# Patient Record
Sex: Female | Born: 1935 | Race: White | Hispanic: No | Smoking: Never smoker
Health system: Southern US, Community
[De-identification: ages and names within clinical notes are randomized; demographics above are authoritative.]

## PROBLEM LIST (undated history)

## (undated) DIAGNOSIS — G250 Essential tremor: Secondary | ICD-10-CM

## (undated) DIAGNOSIS — M810 Age-related osteoporosis without current pathological fracture: Secondary | ICD-10-CM

## (undated) DIAGNOSIS — E042 Nontoxic multinodular goiter: Secondary | ICD-10-CM

## (undated) DIAGNOSIS — I350 Nonrheumatic aortic (valve) stenosis: Secondary | ICD-10-CM

## (undated) DIAGNOSIS — N184 Chronic kidney disease, stage 4 (severe): Secondary | ICD-10-CM

## (undated) DIAGNOSIS — I5032 Chronic diastolic (congestive) heart failure: Secondary | ICD-10-CM

## (undated) DIAGNOSIS — I2510 Atherosclerotic heart disease of native coronary artery without angina pectoris: Secondary | ICD-10-CM

## (undated) DIAGNOSIS — I10 Essential (primary) hypertension: Secondary | ICD-10-CM

## (undated) DIAGNOSIS — N6019 Diffuse cystic mastopathy of unspecified breast: Secondary | ICD-10-CM

## (undated) DIAGNOSIS — K219 Gastro-esophageal reflux disease without esophagitis: Secondary | ICD-10-CM

## (undated) DIAGNOSIS — J9811 Atelectasis: Secondary | ICD-10-CM

## (undated) DIAGNOSIS — H3530 Unspecified macular degeneration: Secondary | ICD-10-CM

## (undated) DIAGNOSIS — Z8711 Personal history of peptic ulcer disease: Secondary | ICD-10-CM

## (undated) DIAGNOSIS — Z8719 Personal history of other diseases of the digestive system: Secondary | ICD-10-CM

## (undated) DIAGNOSIS — C4400 Unspecified malignant neoplasm of skin of lip: Secondary | ICD-10-CM

## (undated) DIAGNOSIS — J309 Allergic rhinitis, unspecified: Secondary | ICD-10-CM

## (undated) DIAGNOSIS — R809 Proteinuria, unspecified: Secondary | ICD-10-CM

## (undated) DIAGNOSIS — E7800 Pure hypercholesterolemia, unspecified: Secondary | ICD-10-CM

## (undated) HISTORY — DX: Gastro-esophageal reflux disease without esophagitis: K21.9

## (undated) HISTORY — DX: Essential (primary) hypertension: I10

## (undated) HISTORY — DX: Proteinuria, unspecified: R80.9

## (undated) HISTORY — DX: Allergic rhinitis, unspecified: J30.9

## (undated) HISTORY — PX: DILATION AND CURETTAGE OF UTERUS: SHX78

## (undated) HISTORY — PX: TONSILLECTOMY: SUR1361

## (undated) HISTORY — PX: CARDIAC VALVE REPLACEMENT: SHX585

## (undated) HISTORY — DX: Diffuse cystic mastopathy of unspecified breast: N60.19

## (undated) HISTORY — DX: Nonrheumatic aortic (valve) stenosis: I35.0

## (undated) HISTORY — DX: Essential tremor: G25.0

## (undated) HISTORY — PX: FRACTURE SURGERY: SHX138

## (undated) HISTORY — PX: CATARACT EXTRACTION, BILATERAL: SHX1313

## (undated) HISTORY — DX: Atherosclerotic heart disease of native coronary artery without angina pectoris: I25.10

## (undated) HISTORY — PX: MOHS SURGERY: SUR867

## (undated) HISTORY — DX: Nontoxic multinodular goiter: E04.2

## (undated) HISTORY — DX: Atelectasis: J98.11

## (undated) HISTORY — PX: TYMPANOPLASTY: SHX33

## (undated) HISTORY — DX: Age-related osteoporosis without current pathological fracture: M81.0

## (undated) HISTORY — PX: ROTATOR CUFF REPAIR: SHX139

## (undated) HISTORY — PX: NASAL SINUS SURGERY: SHX719

---

## 1997-12-31 ENCOUNTER — Other Ambulatory Visit: Admission: RE | Admit: 1997-12-31 | Discharge: 1997-12-31 | Payer: Self-pay | Admitting: Geriatric Medicine

## 1998-01-03 ENCOUNTER — Other Ambulatory Visit: Admission: RE | Admit: 1998-01-03 | Discharge: 1998-01-03 | Payer: Self-pay | Admitting: Geriatric Medicine

## 1998-09-04 ENCOUNTER — Other Ambulatory Visit: Admission: RE | Admit: 1998-09-04 | Discharge: 1998-09-04 | Payer: Self-pay | Admitting: Geriatric Medicine

## 2000-01-23 ENCOUNTER — Other Ambulatory Visit: Admission: RE | Admit: 2000-01-23 | Discharge: 2000-01-23 | Payer: Self-pay | Admitting: Geriatric Medicine

## 2000-02-24 ENCOUNTER — Encounter: Payer: Self-pay | Admitting: Geriatric Medicine

## 2000-02-24 ENCOUNTER — Encounter: Admission: RE | Admit: 2000-02-24 | Discharge: 2000-02-24 | Payer: Self-pay | Admitting: Geriatric Medicine

## 2000-03-01 ENCOUNTER — Encounter: Payer: Self-pay | Admitting: Geriatric Medicine

## 2000-03-01 ENCOUNTER — Encounter: Admission: RE | Admit: 2000-03-01 | Discharge: 2000-03-01 | Payer: Self-pay | Admitting: *Deleted

## 2001-05-05 ENCOUNTER — Encounter: Admission: RE | Admit: 2001-05-05 | Discharge: 2001-05-05 | Payer: Self-pay | Admitting: Geriatric Medicine

## 2001-05-05 ENCOUNTER — Encounter: Payer: Self-pay | Admitting: Geriatric Medicine

## 2001-11-24 ENCOUNTER — Emergency Department (HOSPITAL_COMMUNITY): Admission: EM | Admit: 2001-11-24 | Discharge: 2001-11-24 | Payer: Self-pay | Admitting: Emergency Medicine

## 2001-11-26 HISTORY — PX: WRIST FRACTURE SURGERY: SHX121

## 2001-11-29 ENCOUNTER — Encounter: Admission: RE | Admit: 2001-11-29 | Discharge: 2001-11-29 | Payer: Self-pay | Admitting: Neurology

## 2001-11-30 ENCOUNTER — Ambulatory Visit (HOSPITAL_BASED_OUTPATIENT_CLINIC_OR_DEPARTMENT_OTHER): Admission: RE | Admit: 2001-11-30 | Discharge: 2001-11-30 | Payer: Self-pay | Admitting: Orthopedic Surgery

## 2005-09-02 ENCOUNTER — Emergency Department (HOSPITAL_COMMUNITY): Admission: EM | Admit: 2005-09-02 | Discharge: 2005-09-02 | Payer: Self-pay | Admitting: Emergency Medicine

## 2005-10-14 ENCOUNTER — Encounter: Admission: RE | Admit: 2005-10-14 | Discharge: 2005-10-14 | Payer: Self-pay | Admitting: Geriatric Medicine

## 2006-05-10 ENCOUNTER — Encounter: Admission: RE | Admit: 2006-05-10 | Discharge: 2006-05-10 | Payer: Self-pay | Admitting: Geriatric Medicine

## 2007-01-24 ENCOUNTER — Encounter: Admission: RE | Admit: 2007-01-24 | Discharge: 2007-01-24 | Payer: Self-pay | Admitting: Orthopedic Surgery

## 2007-01-26 ENCOUNTER — Ambulatory Visit (HOSPITAL_BASED_OUTPATIENT_CLINIC_OR_DEPARTMENT_OTHER): Admission: RE | Admit: 2007-01-26 | Discharge: 2007-01-27 | Payer: Self-pay | Admitting: Orthopedic Surgery

## 2007-11-16 ENCOUNTER — Encounter: Admission: RE | Admit: 2007-11-16 | Discharge: 2007-11-16 | Payer: Self-pay | Admitting: Geriatric Medicine

## 2008-11-14 ENCOUNTER — Encounter: Admission: RE | Admit: 2008-11-14 | Discharge: 2008-11-14 | Payer: Self-pay | Admitting: Geriatric Medicine

## 2009-08-28 DIAGNOSIS — I350 Nonrheumatic aortic (valve) stenosis: Secondary | ICD-10-CM

## 2009-08-28 HISTORY — DX: Nonrheumatic aortic (valve) stenosis: I35.0

## 2009-09-22 ENCOUNTER — Emergency Department (HOSPITAL_COMMUNITY): Admission: EM | Admit: 2009-09-22 | Discharge: 2009-09-23 | Payer: Self-pay | Admitting: Emergency Medicine

## 2009-09-28 HISTORY — PX: AORTIC VALVE REPLACEMENT: SHX41

## 2009-09-28 HISTORY — PX: CORONARY ARTERY BYPASS GRAFT: SHX141

## 2009-10-17 ENCOUNTER — Inpatient Hospital Stay (HOSPITAL_BASED_OUTPATIENT_CLINIC_OR_DEPARTMENT_OTHER): Admission: RE | Admit: 2009-10-17 | Discharge: 2009-10-17 | Payer: Self-pay | Admitting: Cardiology

## 2009-10-24 ENCOUNTER — Ambulatory Visit: Payer: Self-pay | Admitting: Cardiothoracic Surgery

## 2009-10-29 DIAGNOSIS — I2510 Atherosclerotic heart disease of native coronary artery without angina pectoris: Secondary | ICD-10-CM

## 2009-10-29 HISTORY — DX: Atherosclerotic heart disease of native coronary artery without angina pectoris: I25.10

## 2009-11-13 ENCOUNTER — Encounter: Payer: Self-pay | Admitting: Cardiothoracic Surgery

## 2009-11-15 ENCOUNTER — Inpatient Hospital Stay (HOSPITAL_COMMUNITY): Admission: RE | Admit: 2009-11-15 | Discharge: 2009-11-23 | Payer: Self-pay | Admitting: Cardiothoracic Surgery

## 2009-11-15 ENCOUNTER — Encounter: Payer: Self-pay | Admitting: Cardiothoracic Surgery

## 2009-11-15 ENCOUNTER — Ambulatory Visit: Payer: Self-pay | Admitting: Cardiothoracic Surgery

## 2009-11-17 HISTORY — PX: CARDIAC CATHETERIZATION: SHX172

## 2009-12-03 ENCOUNTER — Ambulatory Visit: Payer: Self-pay | Admitting: Internal Medicine

## 2009-12-03 ENCOUNTER — Ambulatory Visit: Payer: Self-pay | Admitting: Cardiology

## 2009-12-03 ENCOUNTER — Inpatient Hospital Stay (HOSPITAL_COMMUNITY): Admission: EM | Admit: 2009-12-03 | Discharge: 2009-12-12 | Payer: Self-pay | Admitting: Emergency Medicine

## 2009-12-04 ENCOUNTER — Encounter (INDEPENDENT_AMBULATORY_CARE_PROVIDER_SITE_OTHER): Payer: Self-pay | Admitting: Cardiology

## 2009-12-09 ENCOUNTER — Encounter: Payer: Self-pay | Admitting: Cardiothoracic Surgery

## 2009-12-26 ENCOUNTER — Ambulatory Visit: Payer: Self-pay | Admitting: Cardiothoracic Surgery

## 2009-12-26 ENCOUNTER — Encounter: Admission: RE | Admit: 2009-12-26 | Discharge: 2009-12-26 | Payer: Self-pay | Admitting: Cardiothoracic Surgery

## 2010-01-01 ENCOUNTER — Ambulatory Visit: Payer: Self-pay | Admitting: Cardiothoracic Surgery

## 2010-01-23 ENCOUNTER — Encounter (HOSPITAL_COMMUNITY): Admission: RE | Admit: 2010-01-23 | Discharge: 2010-04-23 | Payer: Self-pay | Admitting: Cardiology

## 2010-02-13 ENCOUNTER — Ambulatory Visit: Payer: Self-pay | Admitting: Cardiothoracic Surgery

## 2010-07-20 IMAGING — CR DG CHEST 2V
2 series · 2 of 2 positions shown · non-contrast
Comparison: 12/07/2009

CLINICAL DATA: Shortness of breath.  Pleural effusions.

CHEST - 2 VIEW

[w chest pa]
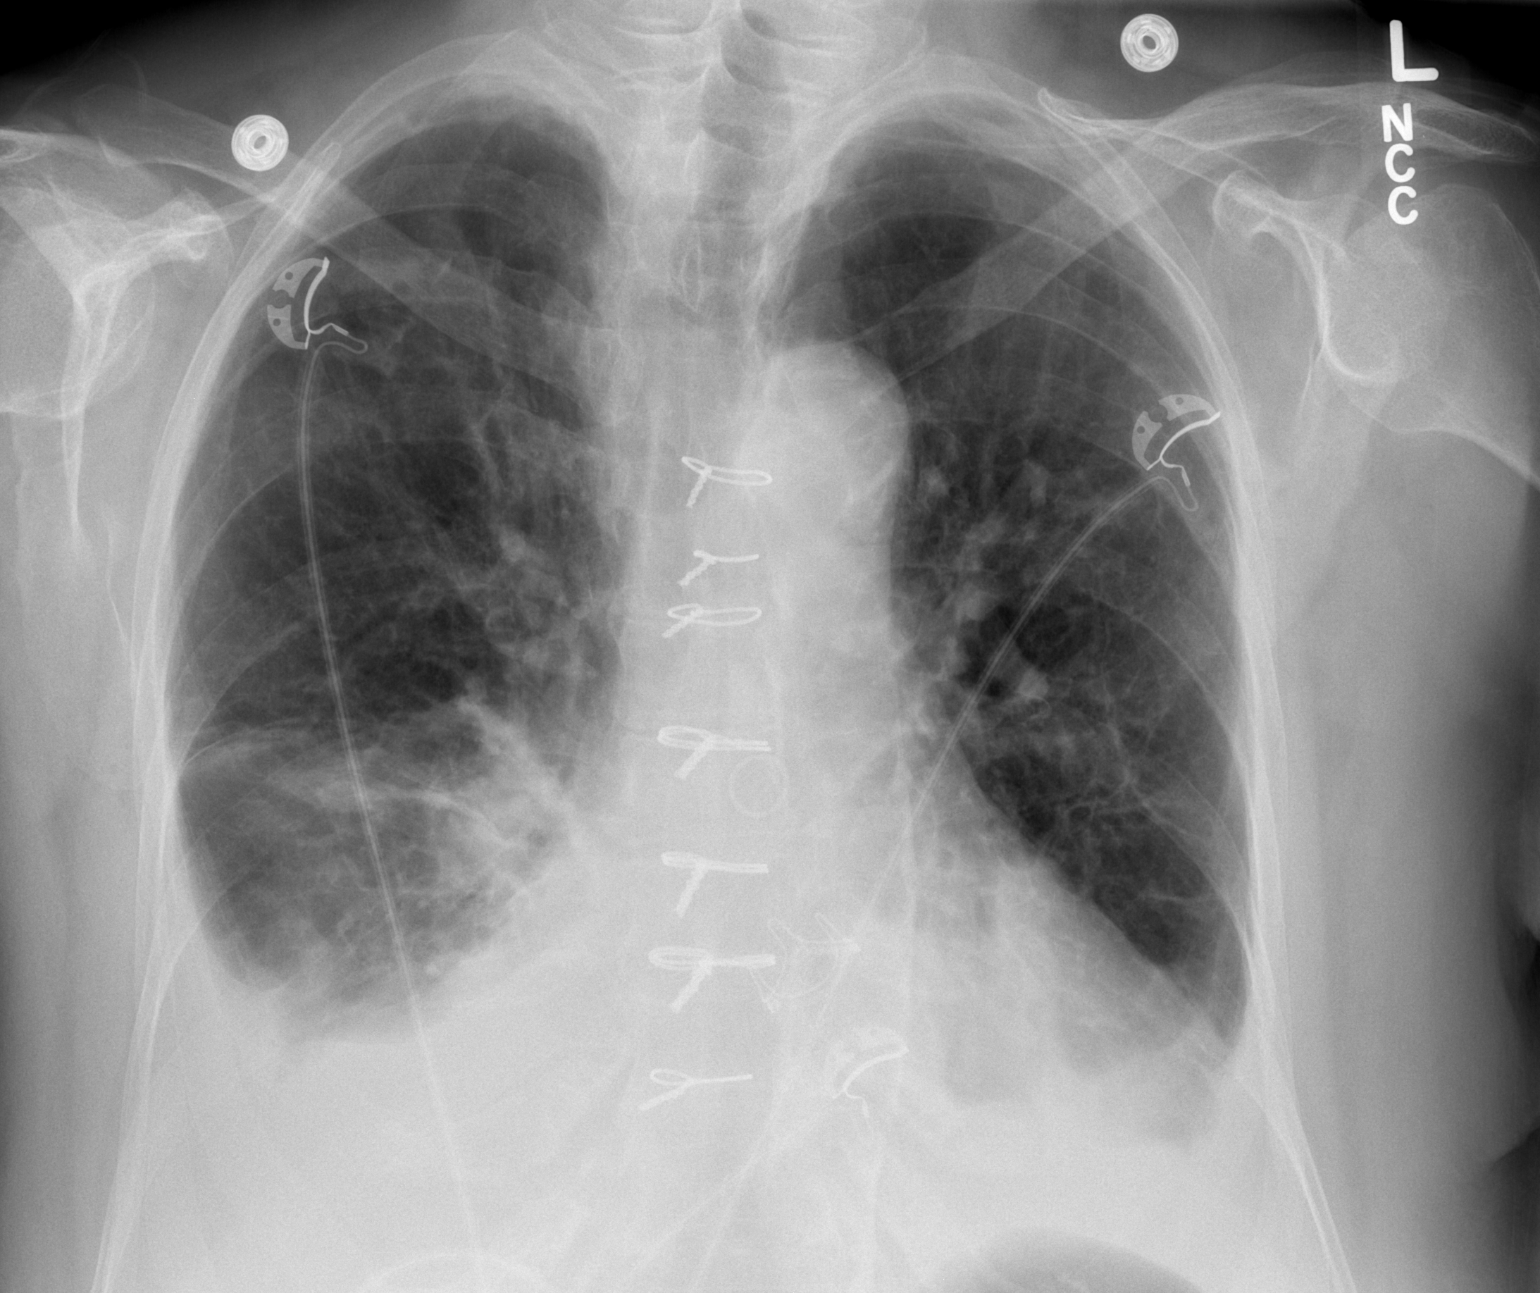

[w chest lat]
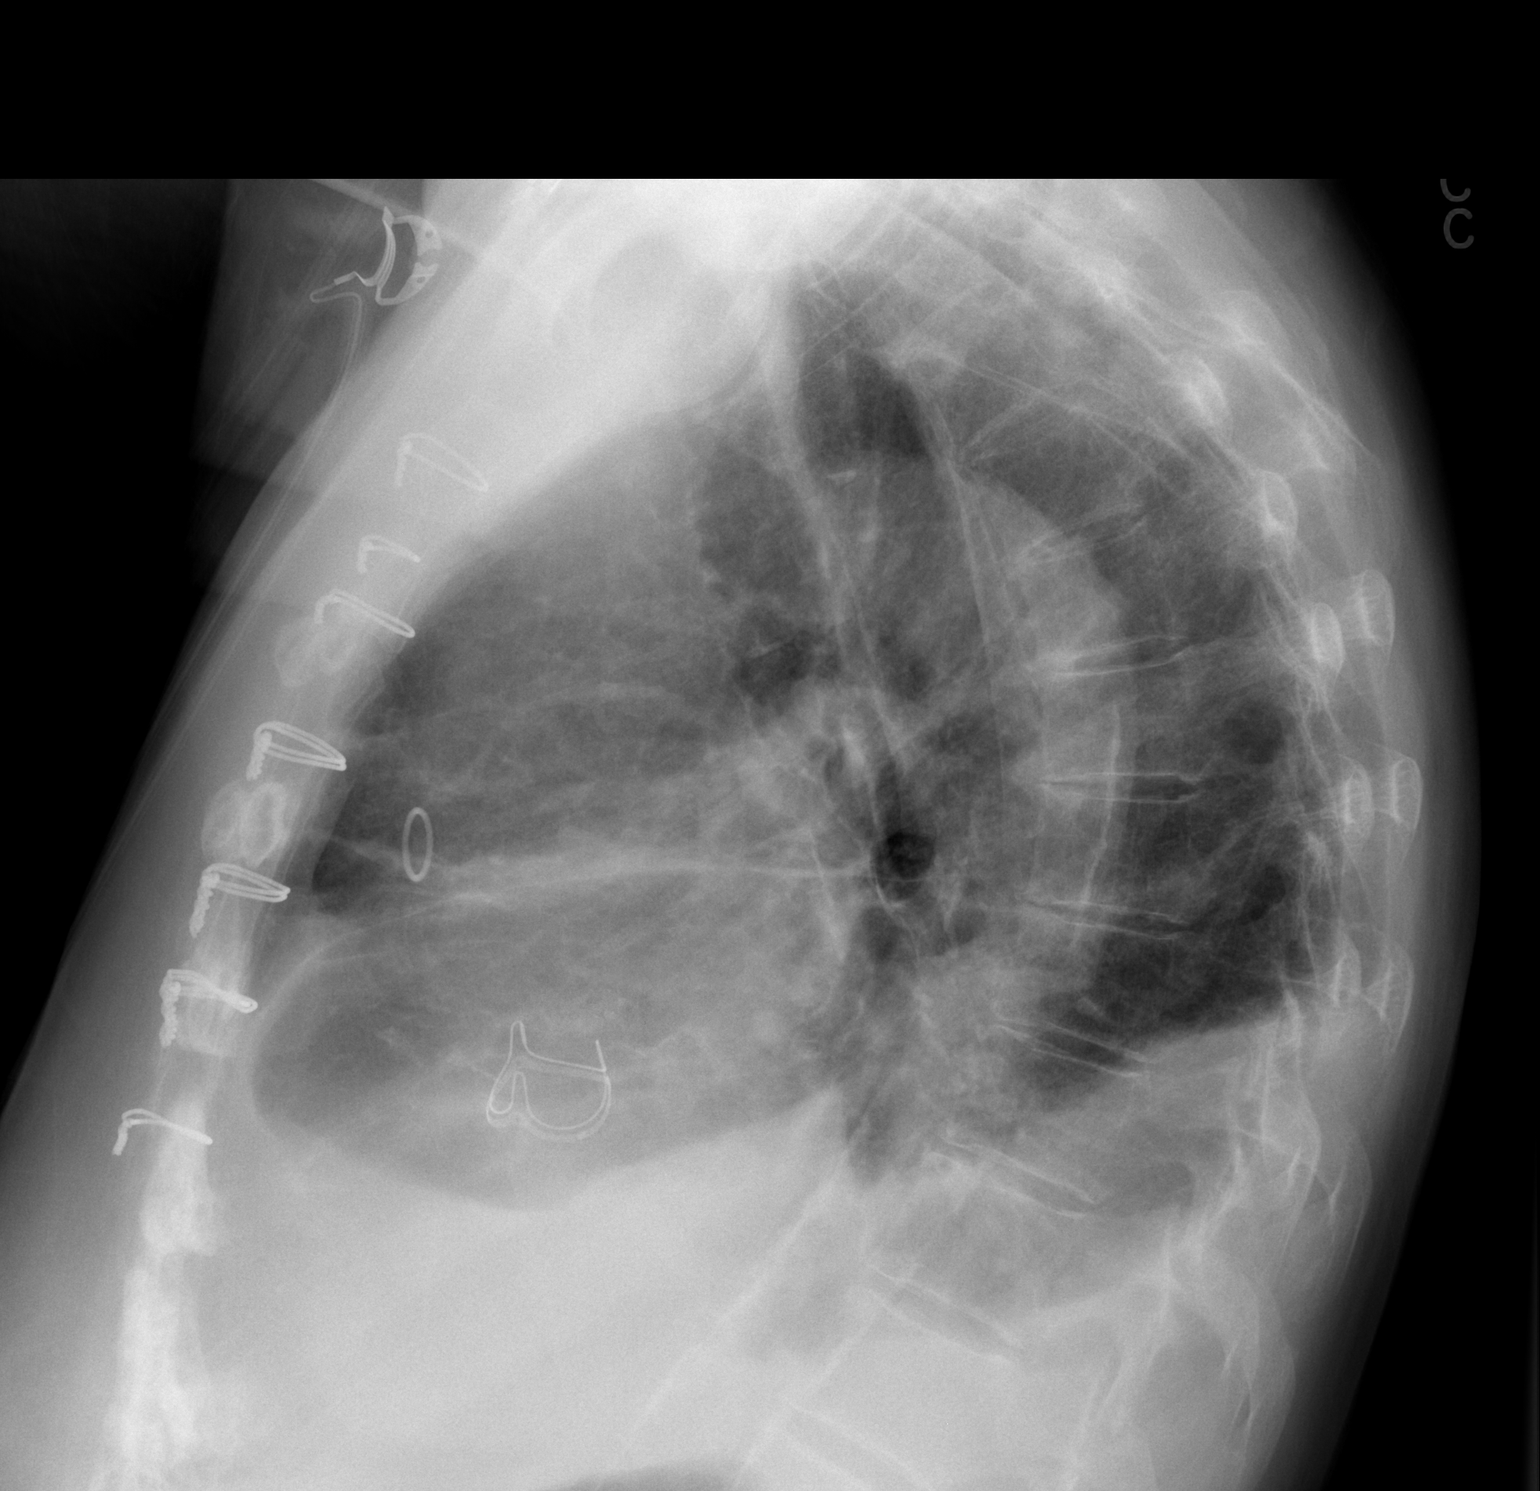

[2 of 2 positions shown; findings below may reference images not displayed]

FINDINGS: Patient has had a median sternotomy and CABG.  The heart
is enlarged.  There are bilateral pleural effusions.  Bibasilar
atelectasis is noted.  There is no evidence for pneumothorax.  No
evidence for pulmonary edema.  Aorta is markedly tortuous and
calcified.
IMPRESSION: 1.  Little interval change.

## 2010-12-14 LAB — POCT I-STAT 3, VENOUS BLOOD GAS (G3P V)
Bicarbonate: 25.2 mEq/L — ABNORMAL HIGH (ref 20.0–24.0)
O2 Saturation: 69 %
TCO2: 27 mmol/L (ref 0–100)
TCO2: 27 mmol/L (ref 0–100)
pCO2, Ven: 44.4 mmHg — ABNORMAL LOW (ref 45.0–50.0)
pH, Ven: 7.362 — ABNORMAL HIGH (ref 7.250–7.300)
pH, Ven: 7.38 — ABNORMAL HIGH (ref 7.250–7.300)

## 2010-12-14 LAB — POCT I-STAT 3, ART BLOOD GAS (G3+)

## 2010-12-17 LAB — URINE CULTURE
Colony Count: NO GROWTH
Culture: NO GROWTH

## 2010-12-17 LAB — POCT I-STAT 4, (NA,K, GLUC, HGB,HCT)
Glucose, Bld: 106 mg/dL — ABNORMAL HIGH (ref 70–99)
Glucose, Bld: 117 mg/dL — ABNORMAL HIGH (ref 70–99)
Glucose, Bld: 119 mg/dL — ABNORMAL HIGH (ref 70–99)
Glucose, Bld: 122 mg/dL — ABNORMAL HIGH (ref 70–99)
Glucose, Bld: 137 mg/dL — ABNORMAL HIGH (ref 70–99)
Glucose, Bld: 96 mg/dL (ref 70–99)
HCT: 20 % — ABNORMAL LOW (ref 36.0–46.0)
HCT: 25 % — ABNORMAL LOW (ref 36.0–46.0)
HCT: 26 % — ABNORMAL LOW (ref 36.0–46.0)
HCT: 29 % — ABNORMAL LOW (ref 36.0–46.0)
HCT: 31 % — ABNORMAL LOW (ref 36.0–46.0)
HCT: 34 % — ABNORMAL LOW (ref 36.0–46.0)
Hemoglobin: 10.5 g/dL — ABNORMAL LOW (ref 12.0–15.0)
Hemoglobin: 11.6 g/dL — ABNORMAL LOW (ref 12.0–15.0)
Hemoglobin: 6.8 g/dL — CL (ref 12.0–15.0)
Hemoglobin: 8.5 g/dL — ABNORMAL LOW (ref 12.0–15.0)
Hemoglobin: 8.8 g/dL — ABNORMAL LOW (ref 12.0–15.0)
Hemoglobin: 9.9 g/dL — ABNORMAL LOW (ref 12.0–15.0)
Potassium: 3.4 mEq/L — ABNORMAL LOW (ref 3.5–5.1)
Potassium: 3.6 mEq/L (ref 3.5–5.1)
Potassium: 3.6 mEq/L (ref 3.5–5.1)
Potassium: 4.1 mEq/L (ref 3.5–5.1)
Potassium: 4.1 mEq/L (ref 3.5–5.1)
Potassium: 5 mEq/L (ref 3.5–5.1)
Sodium: 133 mEq/L — ABNORMAL LOW (ref 135–145)
Sodium: 134 mEq/L — ABNORMAL LOW (ref 135–145)
Sodium: 135 mEq/L (ref 135–145)
Sodium: 137 mEq/L (ref 135–145)
Sodium: 139 mEq/L (ref 135–145)
Sodium: 140 mEq/L (ref 135–145)

## 2010-12-17 LAB — CBC
HCT: 24.1 % — ABNORMAL LOW (ref 36.0–46.0)
HCT: 25.4 % — ABNORMAL LOW (ref 36.0–46.0)
HCT: 25.8 % — ABNORMAL LOW (ref 36.0–46.0)
HCT: 27.1 % — ABNORMAL LOW (ref 36.0–46.0)
HCT: 27.9 % — ABNORMAL LOW (ref 36.0–46.0)
HCT: 28.5 % — ABNORMAL LOW (ref 36.0–46.0)
HCT: 34.3 % — ABNORMAL LOW (ref 36.0–46.0)
HCT: 35.5 % — ABNORMAL LOW (ref 36.0–46.0)
HCT: 39.5 % (ref 36.0–46.0)
Hemoglobin: 11.7 g/dL — ABNORMAL LOW (ref 12.0–15.0)
Hemoglobin: 12.2 g/dL (ref 12.0–15.0)
Hemoglobin: 13.6 g/dL (ref 12.0–15.0)
Hemoglobin: 8.2 g/dL — ABNORMAL LOW (ref 12.0–15.0)
Hemoglobin: 8.5 g/dL — ABNORMAL LOW (ref 12.0–15.0)
Hemoglobin: 8.8 g/dL — ABNORMAL LOW (ref 12.0–15.0)
Hemoglobin: 9.2 g/dL — ABNORMAL LOW (ref 12.0–15.0)
Hemoglobin: 9.4 g/dL — ABNORMAL LOW (ref 12.0–15.0)
Hemoglobin: 9.8 g/dL — ABNORMAL LOW (ref 12.0–15.0)
MCHC: 33.6 g/dL (ref 30.0–36.0)
MCHC: 33.7 g/dL (ref 30.0–36.0)
MCHC: 34 g/dL (ref 30.0–36.0)
MCHC: 34 g/dL (ref 30.0–36.0)
MCHC: 34 g/dL (ref 30.0–36.0)
MCHC: 34.3 g/dL (ref 30.0–36.0)
MCHC: 34.3 g/dL (ref 30.0–36.0)
MCHC: 34.3 g/dL (ref 30.0–36.0)
MCHC: 34.4 g/dL (ref 30.0–36.0)
MCV: 88.6 fL (ref 78.0–100.0)
MCV: 89.4 fL (ref 78.0–100.0)
MCV: 89.9 fL (ref 78.0–100.0)
MCV: 90 fL (ref 78.0–100.0)
MCV: 90.1 fL (ref 78.0–100.0)
MCV: 90.2 fL (ref 78.0–100.0)
MCV: 90.3 fL (ref 78.0–100.0)
MCV: 90.6 fL (ref 78.0–100.0)
MCV: 90.7 fL (ref 78.0–100.0)
MCV: 91 fL (ref 78.0–100.0)
Platelets: 110 10*3/uL — ABNORMAL LOW (ref 150–400)
Platelets: 128 10*3/uL — ABNORMAL LOW (ref 150–400)
Platelets: 219 10*3/uL (ref 150–400)
Platelets: 62 10*3/uL — ABNORMAL LOW (ref 150–400)
Platelets: 63 10*3/uL — ABNORMAL LOW (ref 150–400)
Platelets: 64 10*3/uL — ABNORMAL LOW (ref 150–400)
Platelets: 70 10*3/uL — ABNORMAL LOW (ref 150–400)
Platelets: 82 10*3/uL — ABNORMAL LOW (ref 150–400)
Platelets: 85 10*3/uL — ABNORMAL LOW (ref 150–400)
Platelets: 95 10*3/uL — ABNORMAL LOW (ref 150–400)
RBC: 2.66 MIL/uL — ABNORMAL LOW (ref 3.87–5.11)
RBC: 2.81 MIL/uL — ABNORMAL LOW (ref 3.87–5.11)
RBC: 2.84 MIL/uL — ABNORMAL LOW (ref 3.87–5.11)
RBC: 3.02 MIL/uL — ABNORMAL LOW (ref 3.87–5.11)
RBC: 3.09 MIL/uL — ABNORMAL LOW (ref 3.87–5.11)
RBC: 3.17 MIL/uL — ABNORMAL LOW (ref 3.87–5.11)
RBC: 3.82 MIL/uL — ABNORMAL LOW (ref 3.87–5.11)
RBC: 3.97 MIL/uL (ref 3.87–5.11)
RBC: 4.46 MIL/uL (ref 3.87–5.11)
RDW: 13.7 % (ref 11.5–15.5)
RDW: 14.1 % (ref 11.5–15.5)
RDW: 14.6 % (ref 11.5–15.5)
RDW: 14.7 % (ref 11.5–15.5)
RDW: 14.8 % (ref 11.5–15.5)
RDW: 15.1 % (ref 11.5–15.5)
RDW: 15.2 % (ref 11.5–15.5)
RDW: 15.3 % (ref 11.5–15.5)
RDW: 15.4 % (ref 11.5–15.5)
WBC: 10.3 10*3/uL (ref 4.0–10.5)
WBC: 11.5 10*3/uL — ABNORMAL HIGH (ref 4.0–10.5)
WBC: 13.7 10*3/uL — ABNORMAL HIGH (ref 4.0–10.5)
WBC: 15 10*3/uL — ABNORMAL HIGH (ref 4.0–10.5)
WBC: 7.5 10*3/uL (ref 4.0–10.5)
WBC: 7.8 10*3/uL (ref 4.0–10.5)
WBC: 8.3 10*3/uL (ref 4.0–10.5)
WBC: 8.8 10*3/uL (ref 4.0–10.5)
WBC: 8.9 10*3/uL (ref 4.0–10.5)
WBC: 9.6 10*3/uL (ref 4.0–10.5)

## 2010-12-17 LAB — BASIC METABOLIC PANEL
BUN: 18 mg/dL (ref 6–23)
BUN: 28 mg/dL — ABNORMAL HIGH (ref 6–23)
BUN: 29 mg/dL — ABNORMAL HIGH (ref 6–23)
BUN: 36 mg/dL — ABNORMAL HIGH (ref 6–23)
BUN: 39 mg/dL — ABNORMAL HIGH (ref 6–23)
BUN: 43 mg/dL — ABNORMAL HIGH (ref 6–23)
BUN: 44 mg/dL — ABNORMAL HIGH (ref 6–23)
BUN: 45 mg/dL — ABNORMAL HIGH (ref 6–23)
CO2: 23 mEq/L (ref 19–32)
CO2: 24 mEq/L (ref 19–32)
CO2: 25 mEq/L (ref 19–32)
CO2: 25 mEq/L (ref 19–32)
CO2: 26 mEq/L (ref 19–32)
CO2: 27 mEq/L (ref 19–32)
CO2: 27 mEq/L (ref 19–32)
CO2: 27 mEq/L (ref 19–32)
Calcium: 7.1 mg/dL — ABNORMAL LOW (ref 8.4–10.5)
Calcium: 7.5 mg/dL — ABNORMAL LOW (ref 8.4–10.5)
Calcium: 7.6 mg/dL — ABNORMAL LOW (ref 8.4–10.5)
Calcium: 7.7 mg/dL — ABNORMAL LOW (ref 8.4–10.5)
Calcium: 7.8 mg/dL — ABNORMAL LOW (ref 8.4–10.5)
Calcium: 7.8 mg/dL — ABNORMAL LOW (ref 8.4–10.5)
Calcium: 7.9 mg/dL — ABNORMAL LOW (ref 8.4–10.5)
Calcium: 8 mg/dL — ABNORMAL LOW (ref 8.4–10.5)
Chloride: 101 mEq/L (ref 96–112)
Chloride: 102 mEq/L (ref 96–112)
Chloride: 102 mEq/L (ref 96–112)
Chloride: 103 mEq/L (ref 96–112)
Chloride: 104 mEq/L (ref 96–112)
Chloride: 104 mEq/L (ref 96–112)
Chloride: 107 mEq/L (ref 96–112)
Chloride: 97 mEq/L (ref 96–112)
Creatinine, Ser: 1.19 mg/dL (ref 0.4–1.2)
Creatinine, Ser: 1.28 mg/dL — ABNORMAL HIGH (ref 0.4–1.2)
Creatinine, Ser: 1.51 mg/dL — ABNORMAL HIGH (ref 0.4–1.2)
Creatinine, Ser: 1.73 mg/dL — ABNORMAL HIGH (ref 0.4–1.2)
Creatinine, Ser: 1.94 mg/dL — ABNORMAL HIGH (ref 0.4–1.2)
Creatinine, Ser: 2.09 mg/dL — ABNORMAL HIGH (ref 0.4–1.2)
Creatinine, Ser: 2.5 mg/dL — ABNORMAL HIGH (ref 0.4–1.2)
Creatinine, Ser: 2.77 mg/dL — ABNORMAL HIGH (ref 0.4–1.2)
GFR calc Af Amer: 20 mL/min — ABNORMAL LOW (ref >60)
GFR calc Af Amer: 22 mL/min — ABNORMAL LOW (ref >60)
GFR calc Af Amer: 27 mL/min — ABNORMAL LOW (ref >60)
GFR calc Af Amer: 30 mL/min — ABNORMAL LOW (ref >60)
GFR calc Af Amer: 34 mL/min — ABNORMAL LOW (ref >60)
GFR calc Af Amer: 40 mL/min — ABNORMAL LOW (ref >60)
GFR calc Af Amer: 48 mL/min — ABNORMAL LOW (ref >60)
GFR calc Af Amer: 52 mL/min — ABNORMAL LOW (ref >60)
GFR calc non Af Amer: 16 mL/min — ABNORMAL LOW (ref >60)
GFR calc non Af Amer: 18 mL/min — ABNORMAL LOW (ref >60)
GFR calc non Af Amer: 22 mL/min — ABNORMAL LOW (ref >60)
GFR calc non Af Amer: 24 mL/min — ABNORMAL LOW (ref >60)
GFR calc non Af Amer: 28 mL/min — ABNORMAL LOW (ref >60)
GFR calc non Af Amer: 33 mL/min — ABNORMAL LOW (ref >60)
GFR calc non Af Amer: 40 mL/min — ABNORMAL LOW (ref >60)
GFR calc non Af Amer: 43 mL/min — ABNORMAL LOW (ref >60)
Glucose, Bld: 114 mg/dL — ABNORMAL HIGH (ref 70–99)
Glucose, Bld: 123 mg/dL — ABNORMAL HIGH (ref 70–99)
Glucose, Bld: 127 mg/dL — ABNORMAL HIGH (ref 70–99)
Glucose, Bld: 138 mg/dL — ABNORMAL HIGH (ref 70–99)
Glucose, Bld: 141 mg/dL — ABNORMAL HIGH (ref 70–99)
Glucose, Bld: 142 mg/dL — ABNORMAL HIGH (ref 70–99)
Glucose, Bld: 142 mg/dL — ABNORMAL HIGH (ref 70–99)
Glucose, Bld: 145 mg/dL — ABNORMAL HIGH (ref 70–99)
Potassium: 3.3 mEq/L — ABNORMAL LOW (ref 3.5–5.1)
Potassium: 3.5 mEq/L (ref 3.5–5.1)
Potassium: 3.6 mEq/L (ref 3.5–5.1)
Potassium: 3.7 mEq/L (ref 3.5–5.1)
Potassium: 3.8 mEq/L (ref 3.5–5.1)
Potassium: 3.8 mEq/L (ref 3.5–5.1)
Potassium: 3.8 mEq/L (ref 3.5–5.1)
Potassium: 3.9 mEq/L (ref 3.5–5.1)
Sodium: 134 mEq/L — ABNORMAL LOW (ref 135–145)
Sodium: 134 mEq/L — ABNORMAL LOW (ref 135–145)
Sodium: 134 mEq/L — ABNORMAL LOW (ref 135–145)
Sodium: 136 mEq/L (ref 135–145)
Sodium: 136 mEq/L (ref 135–145)
Sodium: 136 mEq/L (ref 135–145)
Sodium: 138 mEq/L (ref 135–145)
Sodium: 140 mEq/L (ref 135–145)

## 2010-12-17 LAB — POCT I-STAT 3, ART BLOOD GAS (G3+)
Acid-Base Excess: 2 mmol/L (ref 0.0–2.0)
Acid-base deficit: 4 mmol/L — ABNORMAL HIGH (ref 0.0–2.0)
Acid-base deficit: 6 mmol/L — ABNORMAL HIGH (ref 0.0–2.0)
Acid-base deficit: 8 mmol/L — ABNORMAL HIGH (ref 0.0–2.0)
Bicarbonate: 17.5 mEq/L — ABNORMAL LOW (ref 20.0–24.0)
Bicarbonate: 21.1 mEq/L (ref 20.0–24.0)
Bicarbonate: 22.8 mEq/L (ref 20.0–24.0)
Bicarbonate: 27.1 mEq/L — ABNORMAL HIGH (ref 20.0–24.0)
Bicarbonate: 27.4 mEq/L — ABNORMAL HIGH (ref 20.0–24.0)
O2 Saturation: 100 %
O2 Saturation: 96 %
O2 Saturation: 98 %
O2 Saturation: 99 %
O2 Saturation: 99 %
Patient temperature: 35.1
Patient temperature: 36.7
Patient temperature: 36.9
Patient temperature: 99.6
TCO2: 19 mmol/L (ref 0–100)
TCO2: 23 mmol/L (ref 0–100)
TCO2: 24 mmol/L (ref 0–100)
TCO2: 29 mmol/L (ref 0–100)
TCO2: 29 mmol/L (ref 0–100)
pCO2 arterial: 35.9 mmHg (ref 35.0–45.0)
pCO2 arterial: 45.1 mmHg — ABNORMAL HIGH (ref 35.0–45.0)
pCO2 arterial: 45.8 mmHg — ABNORMAL HIGH (ref 35.0–45.0)
pCO2 arterial: 47.3 mmHg — ABNORMAL HIGH (ref 35.0–45.0)
pCO2 arterial: 54.8 mmHg — ABNORMAL HIGH (ref 35.0–45.0)
pH, Arterial: 7.255 — ABNORMAL LOW (ref 7.350–7.400)
pH, Arterial: 7.296 — ABNORMAL LOW (ref 7.350–7.400)
pH, Arterial: 7.302 — ABNORMAL LOW (ref 7.350–7.400)
pH, Arterial: 7.305 — ABNORMAL LOW (ref 7.350–7.400)
pH, Arterial: 7.385 (ref 7.350–7.400)
pO2, Arterial: 113 mmHg — ABNORMAL HIGH (ref 80.0–100.0)
pO2, Arterial: 132 mmHg — ABNORMAL HIGH (ref 80.0–100.0)
pO2, Arterial: 135 mmHg — ABNORMAL HIGH (ref 80.0–100.0)
pO2, Arterial: 288 mmHg — ABNORMAL HIGH (ref 80.0–100.0)
pO2, Arterial: 86 mmHg (ref 80.0–100.0)

## 2010-12-17 LAB — COMPREHENSIVE METABOLIC PANEL
ALT: 16 U/L (ref 0–35)
AST: 22 U/L (ref 0–37)
Albumin: 4.2 g/dL (ref 3.5–5.2)
Alkaline Phosphatase: 51 U/L (ref 39–117)
BUN: 24 mg/dL — ABNORMAL HIGH (ref 6–23)
CO2: 23 mEq/L (ref 19–32)
Calcium: 10 mg/dL (ref 8.4–10.5)
Chloride: 102 mEq/L (ref 96–112)
Creatinine, Ser: 1.23 mg/dL — ABNORMAL HIGH (ref 0.4–1.2)
GFR calc Af Amer: 50 mL/min — ABNORMAL LOW (ref >60)
GFR calc non Af Amer: 41 mL/min — ABNORMAL LOW (ref >60)
Glucose, Bld: 107 mg/dL — ABNORMAL HIGH (ref 70–99)
Potassium: 4.5 mEq/L (ref 3.5–5.1)
Sodium: 137 mEq/L (ref 135–145)
Total Bilirubin: 0.8 mg/dL (ref 0.3–1.2)
Total Protein: 7.2 g/dL (ref 6.0–8.3)

## 2010-12-17 LAB — TYPE AND SCREEN
ABO/RH(D): O POS
Antibody Screen: NEGATIVE

## 2010-12-17 LAB — GLUCOSE, CAPILLARY
Glucose-Capillary: 108 mg/dL — ABNORMAL HIGH (ref 70–99)
Glucose-Capillary: 110 mg/dL — ABNORMAL HIGH (ref 70–99)
Glucose-Capillary: 117 mg/dL — ABNORMAL HIGH (ref 70–99)
Glucose-Capillary: 120 mg/dL — ABNORMAL HIGH (ref 70–99)
Glucose-Capillary: 121 mg/dL — ABNORMAL HIGH (ref 70–99)
Glucose-Capillary: 125 mg/dL — ABNORMAL HIGH (ref 70–99)
Glucose-Capillary: 126 mg/dL — ABNORMAL HIGH (ref 70–99)
Glucose-Capillary: 127 mg/dL — ABNORMAL HIGH (ref 70–99)
Glucose-Capillary: 127 mg/dL — ABNORMAL HIGH (ref 70–99)
Glucose-Capillary: 128 mg/dL — ABNORMAL HIGH (ref 70–99)
Glucose-Capillary: 130 mg/dL — ABNORMAL HIGH (ref 70–99)
Glucose-Capillary: 132 mg/dL — ABNORMAL HIGH (ref 70–99)
Glucose-Capillary: 134 mg/dL — ABNORMAL HIGH (ref 70–99)
Glucose-Capillary: 135 mg/dL — ABNORMAL HIGH (ref 70–99)
Glucose-Capillary: 139 mg/dL — ABNORMAL HIGH (ref 70–99)
Glucose-Capillary: 143 mg/dL — ABNORMAL HIGH (ref 70–99)
Glucose-Capillary: 143 mg/dL — ABNORMAL HIGH (ref 70–99)
Glucose-Capillary: 144 mg/dL — ABNORMAL HIGH (ref 70–99)
Glucose-Capillary: 144 mg/dL — ABNORMAL HIGH (ref 70–99)
Glucose-Capillary: 148 mg/dL — ABNORMAL HIGH (ref 70–99)
Glucose-Capillary: 165 mg/dL — ABNORMAL HIGH (ref 70–99)
Glucose-Capillary: 169 mg/dL — ABNORMAL HIGH (ref 70–99)
Glucose-Capillary: 191 mg/dL — ABNORMAL HIGH (ref 70–99)
Glucose-Capillary: 67 mg/dL — ABNORMAL LOW (ref 70–99)
Glucose-Capillary: 73 mg/dL (ref 70–99)
Glucose-Capillary: 92 mg/dL (ref 70–99)

## 2010-12-17 LAB — POCT I-STAT, CHEM 8
BUN: 19 mg/dL (ref 6–23)
BUN: 24 mg/dL — ABNORMAL HIGH (ref 6–23)
Calcium, Ion: 1.06 mmol/L — ABNORMAL LOW (ref 1.12–1.32)
Calcium, Ion: 1.06 mmol/L — ABNORMAL LOW (ref 1.12–1.32)
Chloride: 104 mEq/L (ref 96–112)
Chloride: 106 mEq/L (ref 96–112)
Creatinine, Ser: 1.2 mg/dL (ref 0.4–1.2)
Creatinine, Ser: 1.8 mg/dL — ABNORMAL HIGH (ref 0.4–1.2)
Glucose, Bld: 147 mg/dL — ABNORMAL HIGH (ref 70–99)
Glucose, Bld: 183 mg/dL — ABNORMAL HIGH (ref 70–99)
HCT: 29 % — ABNORMAL LOW (ref 36.0–46.0)
HCT: 33 % — ABNORMAL LOW (ref 36.0–46.0)
Hemoglobin: 11.2 g/dL — ABNORMAL LOW (ref 12.0–15.0)
Hemoglobin: 9.9 g/dL — ABNORMAL LOW (ref 12.0–15.0)
Potassium: 4.3 mEq/L (ref 3.5–5.1)
Potassium: 4.4 mEq/L (ref 3.5–5.1)
Sodium: 138 mEq/L (ref 135–145)
Sodium: 138 mEq/L (ref 135–145)
TCO2: 23 mmol/L (ref 0–100)
TCO2: 27 mmol/L (ref 0–100)

## 2010-12-17 LAB — CREATININE, URINE, RANDOM: Creatinine, Urine: 43.4 mg/dL

## 2010-12-17 LAB — BLOOD GAS, ARTERIAL
Acid-Base Excess: 0.4 mmol/L (ref 0.0–2.0)
Bicarbonate: 24.5 mEq/L — ABNORMAL HIGH (ref 20.0–24.0)
Drawn by: 206361
FIO2: 0.21 %
O2 Saturation: 96.6 %
Patient temperature: 98.6
TCO2: 25.7 mmol/L (ref 0–100)
pCO2 arterial: 39.5 mmHg (ref 35.0–45.0)
pH, Arterial: 7.41 — ABNORMAL HIGH (ref 7.350–7.400)
pO2, Arterial: 80.9 mmHg (ref 80.0–100.0)

## 2010-12-17 LAB — URINALYSIS, ROUTINE W REFLEX MICROSCOPIC
Bilirubin Urine: NEGATIVE
Glucose, UA: NEGATIVE mg/dL
Hgb urine dipstick: NEGATIVE
Ketones, ur: NEGATIVE mg/dL
Leukocytes, UA: NEGATIVE
Nitrite: NEGATIVE
Protein, ur: 30 mg/dL — AB
Specific Gravity, Urine: 1.012 (ref 1.005–1.030)
Urobilinogen, UA: 0.2 mg/dL (ref 0.0–1.0)
pH: 7 (ref 5.0–8.0)

## 2010-12-17 LAB — POCT I-STAT GLUCOSE
Glucose, Bld: 117 mg/dL — ABNORMAL HIGH (ref 70–99)
Operator id: 3406

## 2010-12-17 LAB — HEMOGLOBIN AND HEMATOCRIT, BLOOD
HCT: 26.9 % — ABNORMAL LOW (ref 36.0–46.0)
Hemoglobin: 9.2 g/dL — ABNORMAL LOW (ref 12.0–15.0)

## 2010-12-17 LAB — ABO/RH: ABO/RH(D): O POS

## 2010-12-17 LAB — APTT
aPTT: 28 seconds (ref 24–37)
aPTT: 38 seconds — ABNORMAL HIGH (ref 24–37)

## 2010-12-17 LAB — HEMOGLOBIN A1C
Hgb A1c MFr Bld: 6.3 % — ABNORMAL HIGH (ref 4.6–6.1)
Mean Plasma Glucose: 134 mg/dL

## 2010-12-17 LAB — PLATELET COUNT: Platelets: 130 10*3/uL — ABNORMAL LOW (ref 150–400)

## 2010-12-17 LAB — CK TOTAL AND CKMB (NOT AT ARMC)
CK, MB: 22.5 ng/mL (ref 0.3–4.0)
Relative Index: 5 — ABNORMAL HIGH (ref 0.0–2.5)
Total CK: 447 U/L — ABNORMAL HIGH (ref 7–177)

## 2010-12-17 LAB — URINE MICROSCOPIC-ADD ON

## 2010-12-17 LAB — MAGNESIUM
Magnesium: 2.7 mg/dL — ABNORMAL HIGH (ref 1.5–2.5)
Magnesium: 2.7 mg/dL — ABNORMAL HIGH (ref 1.5–2.5)
Magnesium: 2.8 mg/dL — ABNORMAL HIGH (ref 1.5–2.5)

## 2010-12-17 LAB — PROTIME-INR
INR: 1.05 (ref 0.00–1.49)
INR: 1.82 — ABNORMAL HIGH (ref 0.00–1.49)
Prothrombin Time: 13.6 seconds (ref 11.6–15.2)
Prothrombin Time: 20.9 seconds — ABNORMAL HIGH (ref 11.6–15.2)

## 2010-12-17 LAB — NA AND K (SODIUM & POTASSIUM), RAND UR
Potassium Urine: 35 mEq/L
Sodium, Ur: 57 mEq/L

## 2010-12-17 LAB — SURGICAL PCR SCREEN
MRSA, PCR: NEGATIVE
Staphylococcus aureus: NEGATIVE

## 2010-12-17 LAB — CREATININE, SERUM
Creatinine, Ser: 1.05 mg/dL (ref 0.4–1.2)
GFR calc Af Amer: 33 mL/min — ABNORMAL LOW (ref >60)
GFR calc Af Amer: >60 mL/min (ref >60)
GFR calc non Af Amer: 50 mL/min — ABNORMAL LOW (ref >60)

## 2010-12-17 LAB — CARDIAC PANEL(CRET KIN+CKTOT+MB+TROPI)
Relative Index: 3 — ABNORMAL HIGH (ref 0.0–2.5)
Total CK: 426 U/L — ABNORMAL HIGH (ref 7–177)
Troponin I: 3.65 ng/mL (ref 0.00–0.06)

## 2010-12-17 LAB — MRSA PCR SCREENING: MRSA by PCR: NEGATIVE

## 2010-12-19 LAB — BASIC METABOLIC PANEL
BUN: 22 mg/dL (ref 6–23)
BUN: 23 mg/dL (ref 6–23)
BUN: 28 mg/dL — ABNORMAL HIGH (ref 6–23)
BUN: 35 mg/dL — ABNORMAL HIGH (ref 6–23)
BUN: 43 mg/dL — ABNORMAL HIGH (ref 6–23)
BUN: 55 mg/dL — ABNORMAL HIGH (ref 6–23)
BUN: 57 mg/dL — ABNORMAL HIGH (ref 6–23)
BUN: 57 mg/dL — ABNORMAL HIGH (ref 6–23)
CO2: 27 mEq/L (ref 19–32)
CO2: 29 mEq/L (ref 19–32)
CO2: 30 mEq/L (ref 19–32)
CO2: 30 mEq/L (ref 19–32)
CO2: 31 mEq/L (ref 19–32)
Calcium: 9.2 mg/dL (ref 8.4–10.5)
Chloride: 103 mEq/L (ref 96–112)
Chloride: 103 mEq/L (ref 96–112)
Chloride: 104 mEq/L (ref 96–112)
Chloride: 105 mEq/L (ref 96–112)
Chloride: 93 mEq/L — ABNORMAL LOW (ref 96–112)
Chloride: 99 mEq/L (ref 96–112)
Chloride: 99 mEq/L (ref 96–112)
Chloride: 99 mEq/L (ref 96–112)
Chloride: 99 mEq/L (ref 96–112)
Creatinine, Ser: 1.36 mg/dL — ABNORMAL HIGH (ref 0.4–1.2)
Creatinine, Ser: 1.57 mg/dL — ABNORMAL HIGH (ref 0.4–1.2)
Creatinine, Ser: 1.63 mg/dL — ABNORMAL HIGH (ref 0.4–1.2)
GFR calc Af Amer: 29 mL/min — ABNORMAL LOW (ref >60)
GFR calc Af Amer: 36 mL/min — ABNORMAL LOW (ref >60)
GFR calc non Af Amer: 26 mL/min — ABNORMAL LOW (ref >60)
GFR calc non Af Amer: 26 mL/min — ABNORMAL LOW (ref >60)
GFR calc non Af Amer: 27 mL/min — ABNORMAL LOW (ref >60)
GFR calc non Af Amer: 28 mL/min — ABNORMAL LOW (ref >60)
GFR calc non Af Amer: 33 mL/min — ABNORMAL LOW (ref >60)
Glucose, Bld: 117 mg/dL — ABNORMAL HIGH (ref 70–99)
Glucose, Bld: 120 mg/dL — ABNORMAL HIGH (ref 70–99)
Glucose, Bld: 128 mg/dL — ABNORMAL HIGH (ref 70–99)
Glucose, Bld: 135 mg/dL — ABNORMAL HIGH (ref 70–99)
Glucose, Bld: 143 mg/dL — ABNORMAL HIGH (ref 70–99)
Glucose, Bld: 188 mg/dL — ABNORMAL HIGH (ref 70–99)
Potassium: 3.2 mEq/L — ABNORMAL LOW (ref 3.5–5.1)
Potassium: 3.7 mEq/L (ref 3.5–5.1)
Potassium: 4 mEq/L (ref 3.5–5.1)
Potassium: 4.1 mEq/L (ref 3.5–5.1)
Potassium: 4.3 mEq/L (ref 3.5–5.1)
Potassium: 4.3 mEq/L (ref 3.5–5.1)
Potassium: 4.7 mEq/L (ref 3.5–5.1)
Potassium: 4.7 mEq/L (ref 3.5–5.1)
Sodium: 132 mEq/L — ABNORMAL LOW (ref 135–145)
Sodium: 136 mEq/L (ref 135–145)
Sodium: 137 mEq/L (ref 135–145)
Sodium: 140 mEq/L (ref 135–145)
Sodium: 140 mEq/L (ref 135–145)
Sodium: 143 mEq/L (ref 135–145)

## 2010-12-19 LAB — CARDIAC PANEL(CRET KIN+CKTOT+MB+TROPI)
CK, MB: 1.3 ng/mL (ref 0.3–4.0)
CK, MB: 1.5 ng/mL (ref 0.3–4.0)
Relative Index: INVALID (ref 0.0–2.5)
Total CK: 46 U/L (ref 7–177)
Total CK: 47 U/L (ref 7–177)
Troponin I: 0.03 ng/mL (ref 0.00–0.06)
Troponin I: 0.03 ng/mL (ref 0.00–0.06)

## 2010-12-19 LAB — BRAIN NATRIURETIC PEPTIDE
Pro B Natriuretic peptide (BNP): 1025 pg/mL — ABNORMAL HIGH (ref 0.0–100.0)
Pro B Natriuretic peptide (BNP): 1100 pg/mL — ABNORMAL HIGH (ref 0.0–100.0)
Pro B Natriuretic peptide (BNP): 646 pg/mL — ABNORMAL HIGH (ref 0.0–100.0)
Pro B Natriuretic peptide (BNP): 660 pg/mL — ABNORMAL HIGH (ref 0.0–100.0)
Pro B Natriuretic peptide (BNP): 715 pg/mL — ABNORMAL HIGH (ref 0.0–100.0)
Pro B Natriuretic peptide (BNP): 859 pg/mL — ABNORMAL HIGH (ref 0.0–100.0)

## 2010-12-19 LAB — COMPREHENSIVE METABOLIC PANEL
AST: 20 U/L (ref 0–37)
Albumin: 3.1 g/dL — ABNORMAL LOW (ref 3.5–5.2)
Calcium: 8.5 mg/dL (ref 8.4–10.5)
Creatinine, Ser: 1.45 mg/dL — ABNORMAL HIGH (ref 0.4–1.2)
GFR calc Af Amer: 41 mL/min — ABNORMAL LOW (ref >60)
GFR calc non Af Amer: 34 mL/min — ABNORMAL LOW (ref >60)
Total Protein: 6.3 g/dL (ref 6.0–8.3)

## 2010-12-19 LAB — PROTEIN, TOTAL: Total Protein: 5.5 g/dL — ABNORMAL LOW (ref 6.0–8.3)

## 2010-12-19 LAB — CBC
HCT: 30.8 % — ABNORMAL LOW (ref 36.0–46.0)
HCT: 31.9 % — ABNORMAL LOW (ref 36.0–46.0)
Hemoglobin: 10.3 g/dL — ABNORMAL LOW (ref 12.0–15.0)
MCHC: 33.5 g/dL (ref 30.0–36.0)
MCV: 92 fL (ref 78.0–100.0)
MCV: 92.3 fL (ref 78.0–100.0)
Platelets: 330 10*3/uL (ref 150–400)
Platelets: 351 10*3/uL (ref 150–400)
RBC: 3.33 MIL/uL — ABNORMAL LOW (ref 3.87–5.11)
RDW: 16 % — ABNORMAL HIGH (ref 11.5–15.5)
WBC: 7.5 10*3/uL (ref 4.0–10.5)
WBC: 9.9 10*3/uL (ref 4.0–10.5)

## 2010-12-19 LAB — ALBUMIN: Albumin: 3.1 g/dL — ABNORMAL LOW (ref 3.5–5.2)

## 2010-12-19 LAB — DIFFERENTIAL
Eosinophils Relative: 3 % (ref 0–5)
Eosinophils Relative: 3 % (ref 0–5)
Lymphocytes Relative: 12 % (ref 12–46)
Lymphocytes Relative: 15 % (ref 12–46)
Lymphs Abs: 1.2 10*3/uL (ref 0.7–4.0)
Lymphs Abs: 1.5 10*3/uL (ref 0.7–4.0)
Monocytes Relative: 7 % (ref 3–12)
Neutro Abs: 7.7 10*3/uL (ref 1.7–7.7)

## 2010-12-19 LAB — BODY FLUID CELL COUNT WITH DIFFERENTIAL
Neutrophil Count, Fluid: 31 % — ABNORMAL HIGH (ref 0–25)
Total Nucleated Cell Count, Fluid: 735 cu mm (ref 0–1000)

## 2010-12-19 LAB — BODY FLUID CULTURE
Gram Stain: NONE SEEN
Special Requests: 3

## 2010-12-19 LAB — URINALYSIS, ROUTINE W REFLEX MICROSCOPIC
Bilirubin Urine: NEGATIVE
Glucose, UA: NEGATIVE mg/dL
Ketones, ur: NEGATIVE mg/dL
pH: 6 (ref 5.0–8.0)

## 2010-12-19 LAB — SAMPLE TO BLOOD BANK

## 2010-12-19 LAB — EXPECTORATED SPUTUM ASSESSMENT W GRAM STAIN, RFLX TO RESP C

## 2010-12-19 LAB — CHOLESTEROL, BODY FLUID

## 2010-12-19 LAB — LACTATE DEHYDROGENASE: LDH: 248 U/L (ref 94–250)

## 2010-12-19 LAB — AFB CULTURE WITH SMEAR (NOT AT ARMC)

## 2010-12-19 LAB — CULTURE, BLOOD (ROUTINE X 2): Culture: NO GROWTH

## 2010-12-19 LAB — GLUCOSE, SEROUS FLUID: Glucose, Fluid: 137 mg/dL

## 2010-12-19 LAB — TSH: TSH: 0.734 u[IU]/mL (ref 0.350–4.500)

## 2010-12-19 LAB — AMYLASE, BODY FLUID

## 2010-12-19 LAB — MAGNESIUM: Magnesium: 2.1 mg/dL (ref 1.5–2.5)

## 2010-12-19 LAB — URINE MICROSCOPIC-ADD ON

## 2010-12-19 LAB — MRSA PCR SCREENING: MRSA by PCR: NEGATIVE

## 2010-12-19 LAB — PROTIME-INR: Prothrombin Time: 14.9 seconds (ref 11.6–15.2)

## 2010-12-28 DIAGNOSIS — M810 Age-related osteoporosis without current pathological fracture: Secondary | ICD-10-CM

## 2010-12-28 HISTORY — DX: Age-related osteoporosis without current pathological fracture: M81.0

## 2010-12-29 LAB — POCT CARDIAC MARKERS
CKMB, poc: <1 ng/mL — ABNORMAL LOW (ref 1.0–8.0)
Myoglobin, poc: 133 ng/mL (ref 12–200)
Troponin i, poc: <0.05 ng/mL (ref 0.00–0.09)

## 2010-12-29 LAB — BRAIN NATRIURETIC PEPTIDE: Pro B Natriuretic peptide (BNP): 171 pg/mL — ABNORMAL HIGH (ref 0.0–100.0)

## 2010-12-29 LAB — BASIC METABOLIC PANEL
BUN: 23 mg/dL (ref 6–23)
CO2: 26 mEq/L (ref 19–32)
Chloride: 98 mEq/L (ref 96–112)
Creatinine, Ser: 1.28 mg/dL — ABNORMAL HIGH (ref 0.4–1.2)
Glucose, Bld: 119 mg/dL — ABNORMAL HIGH (ref 70–99)
Potassium: 3.6 mEq/L (ref 3.5–5.1)

## 2010-12-29 LAB — DIFFERENTIAL
Basophils Relative: 0 % (ref 0–1)
Eosinophils Absolute: 0.3 10*3/uL (ref 0.0–0.7)
Eosinophils Relative: 4 % (ref 0–5)
Lymphs Abs: 2.2 10*3/uL (ref 0.7–4.0)
Monocytes Relative: 8 % (ref 3–12)
Neutrophils Relative %: 57 % (ref 43–77)

## 2010-12-29 LAB — CBC
HCT: 40.8 % (ref 36.0–46.0)
MCHC: 34.3 g/dL (ref 30.0–36.0)
MCV: 87.8 fL (ref 78.0–100.0)
Platelets: 236 10*3/uL (ref 150–400)

## 2011-01-27 ENCOUNTER — Encounter (HOSPITAL_COMMUNITY): Payer: Medicare Other | Attending: Geriatric Medicine

## 2011-01-27 DIAGNOSIS — M810 Age-related osteoporosis without current pathological fracture: Secondary | ICD-10-CM | POA: Insufficient documentation

## 2011-02-10 NOTE — Assessment & Plan Note (Signed)
OFFICE VISIT   Victoria Banks, Victoria Banks  DOB:  04-Nov-1920                                        December 26, 2009  CHART #:  [id number]   HISTORY:  The patient returns to the office in followup after her recent  aortic valve replacement with a 21 pericardial tissue valve and coronary  artery bypass grafting x1.  She is a 77 year old female.  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  She now returns to the office today  for repeat chest x-ray.  She has improved, but very slowly.  She is  increasing her physical activity, but still notes that she is very weak,  has poor appetite, but does overall seem to be improving.   PHYSICAL EXAMINATION:  Her blood pressure 133/84, pulse 69, respiratory  rate is 18, and O2 sats 92% on room air.  She has clear breath sounds  bilaterally.  She has no pedal edema.  Her sternum is stable and well  healed.  Bowel sounds are crisp without any murmur of aortic  insufficiency.   DIAGNOSTIC TESTS:  Followup chest x-ray shows clear bilateral pleural  effusions, just very slight blunting of the right costophrenic angle.   IMPRESSION:  Overall, her chest x-ray is improving.  I have encouraged  her to take dietary supplements and increase her nutritional status.  In  3-4 weeks if she continues to improve, hopefully we can get her enrolled  in a cardiac rehab program.  She continues on medications as she was  discharged home by Dr. Mayford Knife including aspirin 81 mg a day, amlodipine,  metoprolol, Lasix 80 mg once a day, potassium, and melatonin.  The  patient notes that she has been to Dr.  Norris Cross office yesterday for followup lab results to check her  potassium.  I plan to see her back in 1 week.   Sheliah Plane, MD  Electronically Signed   EG/MEDQ  D:  12/26/2009  T:  12/27/2009  Job:  [id number]   cc:   Armanda Magic, M.D.

## 2011-02-10 NOTE — Assessment & Plan Note (Signed)
OFFICE VISIT   Victoria Banks, Victoria Banks  DOB:  1921-01-15                                        January 01, 2010  CHART #:  [id number]   The patient returns to office today in followup after aortic valve  replacement with pericardial tissue valve 21 mm and coronary artery  bypass grafting x1 done on February 18.  The patient is 77 years old and  has been slow in recovery.  She required readmission because of probable  postpericardiotomy syndrome with fluid retention.  This is gradually  improving.  She was seen last week and then returns to the office this  week.  Over the past week, she has gained strength.  She is now returned  to home living on her own with her family and fixing her own meals.  Her  family has been offering her transportation.  We discussed possibly in 2  weeks starting in cardiac rehab.  She notes that the edema in her lower  extremities is improving.  Her shoes now fit normally.  She denies  dyspnea on exertion.   On exam today her blood pressure 157/77, pulse 78, respiratory rate 18,  02 saturations 94%.  Her sternum is stable and well healed.  She has  slight crackles at the left base, clear on the right, but overall sounds  better than last week.  She has no pedal edema.   The patient noted she had laboratory drawn today in Dr. Norris Cross office,  but we have no results on this.  Overall, she seems to be slowly  improving.  She is to see Dr. Mayford Knife in 3 weeks, and I will plan to see  her in 6 weeks.   The patient noted partial broken plate while on the hospital and is now  going to return to see the dentist.  She is aware and we further  discussed the predental need for antibiotics with her pericardial tissue  valve in place.  Therefore, impression only, she would not need  antibiotics.   Sheliah Plane, MD  Electronically Signed   EG/MEDQ  D:  01/01/2010  T:  01/02/2010  Job:  [id number]   cc:   Hal T. Stoneking, M.D.  Armanda Magic,  M.D.

## 2011-02-10 NOTE — Assessment & Plan Note (Signed)
OFFICE VISIT   Victoria Banks, Victoria Banks  DOB:  27-Oct-1920                                        Feb 13, 2010  CHART #:  [id number]   The patient returns to the office today following aortic valve  replacement February 18, in this 77 year old female.  She comes to the  office today on her own.  She notes that she has been going to a cardiac  rehab for about 3 weeks and notes that it is strenuous for her, but she  has been able to do it and feels like she is slowly gaining strength.  She denies any overt symptoms of congestive heart failure.  She does  continue on 80 mg of Lasix once a day, the night time 20 mg dose has  been stopped.  The chronic cough she had has improved after being off  beta-blocker, she is now on Diovan.   PHYSICAL EXAMINATION:  Vital Signs:  Her blood pressure 153/77, pulse  96, respiratory rate is 18, O2 sats 93%.  Chest:  Her sternum is stable  and well healed.  Cardiac: Bowel sounds are crisp, without any murmur of  aortic insufficiency.  Lungs:  Her lungs sound clear bilaterally.  Extremities:  She has no pedal edema.   As noted above, she continues on calcium, amlodipine, furosemide 80 a  day, potassium, Lipitor and Diovan.  Overall, I am pleased with her  progress now our initial postoperative course was slowed by development  of effusions, but these now resolved over the probably related to  postpericardiotomy syndrome and not overt congestive heart failure.  Overt the next several months, she could probably be tapered down on her  Lasix dose as long as her fluid balance stays  adequate.  I have not made a return appointment to see me but be glad to  see her at her or Dr. Norris Cross request at anytime.   Sheliah Plane, MD  Electronically Signed   EG/MEDQ  D:  02/13/2010  T:  02/14/2010  Job:  [id number]   cc:   Armanda Magic, M.D.  Hal T. Stoneking, M.D.

## 2011-02-11 ENCOUNTER — Other Ambulatory Visit: Payer: Self-pay | Admitting: Geriatric Medicine

## 2011-02-11 DIAGNOSIS — J019 Acute sinusitis, unspecified: Secondary | ICD-10-CM

## 2011-02-12 ENCOUNTER — Ambulatory Visit
Admission: RE | Admit: 2011-02-12 | Discharge: 2011-02-12 | Disposition: A | Discharge status: Home / Self Care | Payer: Medicare Other | Source: Ambulatory Visit | Attending: Geriatric Medicine | Admitting: Geriatric Medicine

## 2011-02-12 DIAGNOSIS — J019 Acute sinusitis, unspecified: Secondary | ICD-10-CM

## 2011-02-13 NOTE — Op Note (Signed)
[place]  Patient:    Victoria Banks, Victoria Banks Visit Number: [id number] MRN: [id number]          Service Type: DSU Location: [facility] Attending Physician:  Marlowe Shores Dictated by:   Artist Pais Mina Marble, M.D. Proc. Date: 11/30/01 Admit Date:  11/30/2001                             Operative Report  PREOPERATIVE DIAGNOSIS:  Left wrist distal radius fracture, displaced.  POSTOPERATIVE DIAGNOSIS:  Left wrist distal radius fracture, displaced.  PROCEDURE:  ORIF left distal radius fracture with volar approach.  SURGEON:  Artist Pais. Mina Marble, M.D.  ASSISTANT:  Junius Roads. Ireton, P.A.C.  ANESTHESIA:  Axillary block with supplemental general.  TOURNIQUET TIME:  55 minutes.  COMPLICATIONS:  None.  DRAINS:  None.  DESCRIPTION:  Patient was taken to the operating room where after the induction of adequate axillary block analgesia the left upper extremity was prepped and draped in usual sterile fashion.  An Esmarch was used to exsanguinate the limb and the tourniquet was inflated to 250 mmHg.  At this point in time, an anterior approach to the distal radius via Sherilyn Cooter was undertaken.  The incision was taken down through the skin and subcutaneous tissues over the FCR tendon.  The tendon was identified, the sheath was incised, the FCR was retracted to the midline, the artery to the lateral side, and the interval was developed until the pronator quadratus was identified. The pronator quadratus had been completely stripped off the distal radius.  A subperiosteal dissection was undertaken.  At this point in time, visualization of the distal radius revealed a large metaphyseal void just below the joint surface with a large scaphoid and lunate facet type fractures.  At this point in time, intraoperative x-rays under traction revealed this void.  This was then packed with cancellous chips.  After this was done the DVR plate was placed under the proximal  radius, secured with one screw, and adjusted until it was in the right position.  Next, the fracture was reduced with manual traction and volar flexion followed by placement of the pegs starting from the middle ulnar peg, followed by the radial styloid peg, followed by the next most radial, and then the farther-most ulnar.  This was all done under direct vision and fluoroscopic guidance.  X-rays then showed a good reduction in both the AP/lateral and oblique views with good placement of the pegs in the subchondral bone.  The remaining proximal screws were then placed.  The wound was irrigated and closed with a running 3-0 Prolene subcuticular stitch. Steri-Strips, 4x4s, fluffs, and a volar splint were applied.  The patient tolerated the procedure well and went to recovery room in stable fashion. Dictated by:   Artist Pais Mina Marble, M.D. Attending Physician:  Marlowe Shores DD:  11/30/01 TD:  11/30/01 Job: [id number]

## 2011-02-13 NOTE — Op Note (Signed)
NAMEERIKAH, Banks               ACCOUNT NO.:  [id number]   MEDICAL RECORD NO.:  [id number]          PATIENT TYPE:  AMB   LOCATION:  DSC                          FACILITY:  [facility]   PHYSICIAN:  Katy Fitch. Sypher, M.D. DATE OF BIRTH:  08/04/1921   DATE OF PROCEDURE:  01/26/2007  DATE OF DISCHARGE:                               OPERATIVE REPORT   PREOPERATIVE DIAGNOSES:  Chronic right rotator cuff tear due to stage  III impingement, with AC arthropathy, partial biceps tear, adhesive  capsulitis and labral degenerative tearing.   POSTOP DIAGNOSES:  Chronic right rotator cuff tear due to stage III  impingement, with AC arthropathy, partial biceps tear, adhesive  capsulitis and labral degenerative tearing.  Confirmation of significant  labral degenerative tear, adhesive capsulitis, a retracted tear of the  supraspinatus/infraspinatus, and partial-thickness tear of the teres  minor with unfavorable AC morphology.   OPERATIONS:  1. Examination of the right shoulder under anesthesia.  2. Arthroscopic debridement of right glenohumeral joint, with:      a.     Partial debridement of 50% biceps tendon tear.      b.     Debridement of labral pathology.      c.     Debridement of adhesive capsulitis granulation tissue.  3. Subacromial decompression with coracoacromial ligament relaxation      acromioplasty.  4. Open distal clavicle resection.  5. Reconstruction of rotator cuff by convergence of supraspinatus and      infraspinatus tendons, with bicortical screw anchors x2 and      McLaughlin through-bone suture x1.   OPERATING SURGEON:  Katy Fitch. Sypher, M.D.   ASSISTANT:  Artist Pais. Mina Marble, M.D.   ANESTHESIA:  General endotracheal, supplemented by right interscalene  block.   SUPERVISING ANESTHESIOLOGIST:  Bedelia Person, M.D.   ESTIMATED BLOOD LOSS:  100 mL.  No replacement.   COMPLICATIONS:  None apparent.   INDICATIONS:  Victoria Banks is an 77 year old woman who is in excellent  health.  She was referred by her primary care physician and Dr. Mina Marble  for evaluation and management of a painful right shoulder.  Clinical  examination revealed weakness of abduction external rotation and mild  adhesive capsulitis.  Plain films documented significant AC arthropathy,  and an MRI of the shoulder confirmed a retracted supraspinatus and  infraspinatus rotator cuff tear.   She did not have volume loss of her supraspinatus and appeared to have  normal appearance of the infraspinatus and teres minor muscles.  The  subscapularis was normal.   The biceps was frayed and the labrum was degenerative.   After lengthy informed consent, [name] elected to proceed with  diagnostic arthroscopy anticipating possible rotator cuff repair.  She  is a healthy 77 year old and wants to remain quite active.   Preoperatively questions were invited and answered.   PROCEDURE:  Victoria Banks is brought to the operating room and placed in  the supine position on the operating room table.  Following anesthesia  consult with Dr. Gypsy Balsam in the holding area, general anesthesia was  accepted.  Dr. Gypsy Balsam recommended a preoperative  interscalene block,  which was placed without complication in the holding area -- leading to  complete anesthesia of the right forequarter and arm.   Ms. Clune was transferred to room #1, placed in supine position on the  operating table, and under Dr. Burnett Corrente strict supervision general  endotracheal anesthesia induced.   She was carefully positioned in the beach-chair position, with the aid  of a torso and head holder designed for shoulder arthroscopy.  The right  upper extremity and forequarter were prepped with DuraPrep and draped  with impervious arthroscopy drapes.   The shoulder was examined under anesthesia and found be stable in all  planes of motion.  She had crepitation due to her impingement.   The scope was then introduced through a standard posterior  viewing  portal with blunt technique.   Diagnostic arthroscopy confirmed abundant adhesive capsulitis  granulation tissue, the aforementioned degenerative biceps tendon with a  50% tear, and a retracted tear of the infraspinatus and supraspinatus.  The subscapularis was normal.  The teres minor was undermined with a  large cyst in the humeral head and multiple ostia present at the bare  spot.   An anterior portal was created under direct vision, followed by  aggressive debridement of the deep surface of the rotator cuff and  biceps tendon to a smooth margin.  The degenerative labrum was debrided,  followed by synovectomy and tenolysis of the subscapularis and anterior  capsular tissues.   The scope was then removed from the glenohumeral joint, and we proceeded  directly to open reconstruction of the rotator cuff.  A 6 cm incision  was fashioned from the distal clavicle across the anterior acromion.  The anterior third of the deltoid was elevated off the acromion  subperiosteally with an osteotome and scalpel.  The distal 15 mm of  clavicle was exposed subperiosteally, followed by use of an oscillating  saw to remove the distal clavicle.  The medial osteophyte at the [facility]  joint was removed with rongeurs and an oscillating saw.   The rotator cuff was debrided.  It was released from the glenohumeral  capsule and the coracohumeral ligament was relaxed.   The tendon was then debrided to a stable margin, followed by placement  of a #2 FiberWire baseball stitch for convergence of the supraspinatus  and infraspinatus tear.   After this was tied, the suture was then replaced through tendon and  advanced to close the defect with through-bone McLaughlin technique.   Two bicortical screw anchors were placed, one at the medial footprint of  the infraspinatus an one at the medial footprint of the supraspinatus. A total of 4 mattress sutures were used to inset the tendon, with an  excellent  medial footprint to the decorticated greater tuberosity.  We  did lower the profile of the greater tuberosity approximately 3 mm with  a power bur, in preparation for the repair.   After the Wakemed suture was tensioned and the mattress sutures  appropriately tied, a virtually anatomic repair was achieved.  The apex  of the repair was then reinforced with a 0 Vicryl mattress suture that  was brought through bone for greater inset.   After irrigation with the arthroscopic pump, the subacromial space was  inspected and hemostasis assured.  The deltoid was then repaired  anatomically by repair to the capsule of the [facility] joint and trapezius  muscle, closing the dead space created by distal clavicle resection.  The anterior third of the deltoid was  repaired anatomically to its  tendon and periosteum, with a series of interrupted sutures of  #2  FiberWire and 0 Vicryl.   The wound was then repaired with subdermal sutures of 2-0 Vicryl and  intradermal 3-0 Prolene.  There were no apparent complications.   Ms. Holik tolerated the surgery and anesthesia well.  She was  transferred to the recovery room with stable vital signs.  She will be  admitted to the recovery care center for observation of vital signs,  with appropriate analgesics in the form of p.o. and IV Dilaudid and IV  PCA morphine if needed.   She will be provided Ancef 1 gram IV q.8 h as prophylactic antibiotic.   Marland Kitchen      Katy Fitch Sypher, M.D.  Electronically Signed     RVS/MEDQ  D:  01/26/2007  T:  01/26/2007  Job:  [id number]   cc:   Hal T. Stoneking, M.D.

## 2011-11-09 DIAGNOSIS — Z79899 Other long term (current) drug therapy: Secondary | ICD-10-CM | POA: Diagnosis not present

## 2011-11-09 DIAGNOSIS — E7800 Pure hypercholesterolemia, unspecified: Secondary | ICD-10-CM | POA: Diagnosis not present

## 2011-11-12 DIAGNOSIS — I10 Essential (primary) hypertension: Secondary | ICD-10-CM | POA: Diagnosis not present

## 2011-11-12 DIAGNOSIS — I359 Nonrheumatic aortic valve disorder, unspecified: Secondary | ICD-10-CM | POA: Diagnosis not present

## 2011-11-12 DIAGNOSIS — I2510 Atherosclerotic heart disease of native coronary artery without angina pectoris: Secondary | ICD-10-CM | POA: Diagnosis not present

## 2011-11-12 DIAGNOSIS — E7800 Pure hypercholesterolemia, unspecified: Secondary | ICD-10-CM | POA: Diagnosis not present

## 2011-12-10 DIAGNOSIS — Z1331 Encounter for screening for depression: Secondary | ICD-10-CM | POA: Diagnosis not present

## 2011-12-10 DIAGNOSIS — F411 Generalized anxiety disorder: Secondary | ICD-10-CM | POA: Diagnosis not present

## 2011-12-10 DIAGNOSIS — I129 Hypertensive chronic kidney disease with stage 1 through stage 4 chronic kidney disease, or unspecified chronic kidney disease: Secondary | ICD-10-CM | POA: Diagnosis not present

## 2011-12-10 DIAGNOSIS — Z0000 Encounter for general adult medical examination without abnormal findings: Secondary | ICD-10-CM | POA: Diagnosis not present

## 2011-12-17 ENCOUNTER — Ambulatory Visit (INDEPENDENT_AMBULATORY_CARE_PROVIDER_SITE_OTHER): Payer: Medicare Other | Admitting: Family Medicine

## 2011-12-17 VITALS — BP 129/69 | HR 87 | Temp 98.0°F | Resp 16 | Ht 61.5 in | Wt 114.6 lb

## 2011-12-17 DIAGNOSIS — K1379 Other lesions of oral mucosa: Secondary | ICD-10-CM

## 2011-12-17 DIAGNOSIS — K044 Acute apical periodontitis of pulpal origin: Secondary | ICD-10-CM | POA: Diagnosis not present

## 2011-12-17 DIAGNOSIS — K1370 Unspecified lesions of oral mucosa: Secondary | ICD-10-CM | POA: Diagnosis not present

## 2011-12-17 DIAGNOSIS — K047 Periapical abscess without sinus: Secondary | ICD-10-CM

## 2011-12-17 MED ORDER — PENICILLIN V POTASSIUM 500 MG PO TABS: 500.0000 mg | ORAL_TABLET | Freq: Three times a day (TID) | ORAL | Status: AC

## 2011-12-17 NOTE — Progress Notes (Signed)
  Subjective:    Patient ID: Victoria Banks, female    DOB: 1921-09-21, 78 y.o.   MRN: [id number]  HPI 78 yo female with mouth pain.  Has partial dentures.  One has not been fitting well lately.  Since Friday it has been rubbing badly.  Went to dentist Monday.  Told to use salt water rinses. He did adjust them but she hasn't put them back in, using a different one that fits better.  Right upper jaw.  Feels swollen.  No fever.  Taking tylenol for the pain, takes edge off but doesn't go away.    Review of Systems Negative except as per HPI     Objective:   Physical Exam  Constitutional: She appears well-developed.  Pulmonary/Chest: Effort normal.  Neurological: She is alert.   Visible swelling right jaw.  Mild TTP.  Right, upper, mid-lateral gum line with visible swelling and erythema.  No fluctuance.  Partial in .        Assessment & Plan:  Dental infection - PCN.  F/U with dentist.

## 2011-12-22 ENCOUNTER — Encounter (HOSPITAL_COMMUNITY): Payer: Medicare Other

## 2011-12-23 ENCOUNTER — Encounter (HOSPITAL_COMMUNITY): Payer: Medicare Other

## 2011-12-24 ENCOUNTER — Encounter (HOSPITAL_COMMUNITY)
Admission: RE | Admit: 2011-12-24 | Discharge: 2011-12-24 | Disposition: A | Discharge status: Home / Self Care | Payer: Medicare Other | Source: Ambulatory Visit | Attending: Geriatric Medicine | Admitting: Geriatric Medicine

## 2011-12-24 DIAGNOSIS — M810 Age-related osteoporosis without current pathological fracture: Secondary | ICD-10-CM | POA: Insufficient documentation

## 2011-12-24 MED ORDER — ZOLEDRONIC ACID 5 MG/100ML IV SOLN
5.0000 mg | Freq: Once | INTRAVENOUS | Status: AC
  Administered 2011-12-24: 5 mg via INTRAVENOUS
  Filled 2011-12-24: qty 100

## 2012-06-07 DIAGNOSIS — R05 Cough: Secondary | ICD-10-CM | POA: Diagnosis not present

## 2012-06-07 DIAGNOSIS — I129 Hypertensive chronic kidney disease with stage 1 through stage 4 chronic kidney disease, or unspecified chronic kidney disease: Secondary | ICD-10-CM | POA: Diagnosis not present

## 2012-06-28 DIAGNOSIS — Z23 Encounter for immunization: Secondary | ICD-10-CM | POA: Diagnosis not present

## 2012-06-29 DIAGNOSIS — Z1231 Encounter for screening mammogram for malignant neoplasm of breast: Secondary | ICD-10-CM | POA: Diagnosis not present

## 2012-08-12 DIAGNOSIS — R05 Cough: Secondary | ICD-10-CM | POA: Diagnosis not present

## 2012-09-07 DIAGNOSIS — H04129 Dry eye syndrome of unspecified lacrimal gland: Secondary | ICD-10-CM | POA: Diagnosis not present

## 2012-11-16 DIAGNOSIS — Z0000 Encounter for general adult medical examination without abnormal findings: Secondary | ICD-10-CM | POA: Diagnosis not present

## 2012-11-16 DIAGNOSIS — I129 Hypertensive chronic kidney disease with stage 1 through stage 4 chronic kidney disease, or unspecified chronic kidney disease: Secondary | ICD-10-CM | POA: Diagnosis not present

## 2012-11-16 DIAGNOSIS — Z79899 Other long term (current) drug therapy: Secondary | ICD-10-CM | POA: Diagnosis not present

## 2012-11-16 DIAGNOSIS — R05 Cough: Secondary | ICD-10-CM | POA: Diagnosis not present

## 2012-11-16 DIAGNOSIS — E7800 Pure hypercholesterolemia, unspecified: Secondary | ICD-10-CM | POA: Diagnosis not present

## 2012-11-17 DIAGNOSIS — I10 Essential (primary) hypertension: Secondary | ICD-10-CM | POA: Diagnosis not present

## 2012-11-17 DIAGNOSIS — E7800 Pure hypercholesterolemia, unspecified: Secondary | ICD-10-CM | POA: Diagnosis not present

## 2012-11-17 DIAGNOSIS — I359 Nonrheumatic aortic valve disorder, unspecified: Secondary | ICD-10-CM | POA: Diagnosis not present

## 2012-11-17 DIAGNOSIS — Z79899 Other long term (current) drug therapy: Secondary | ICD-10-CM | POA: Diagnosis not present

## 2012-11-17 DIAGNOSIS — I2510 Atherosclerotic heart disease of native coronary artery without angina pectoris: Secondary | ICD-10-CM | POA: Diagnosis not present

## 2012-12-14 DIAGNOSIS — Z0000 Encounter for general adult medical examination without abnormal findings: Secondary | ICD-10-CM | POA: Diagnosis not present

## 2012-12-14 DIAGNOSIS — Z79899 Other long term (current) drug therapy: Secondary | ICD-10-CM | POA: Diagnosis not present

## 2012-12-14 DIAGNOSIS — Z1331 Encounter for screening for depression: Secondary | ICD-10-CM | POA: Diagnosis not present

## 2012-12-28 DIAGNOSIS — E7800 Pure hypercholesterolemia, unspecified: Secondary | ICD-10-CM | POA: Diagnosis not present

## 2013-01-25 DIAGNOSIS — Z79899 Other long term (current) drug therapy: Secondary | ICD-10-CM | POA: Diagnosis not present

## 2013-06-13 DIAGNOSIS — I129 Hypertensive chronic kidney disease with stage 1 through stage 4 chronic kidney disease, or unspecified chronic kidney disease: Secondary | ICD-10-CM | POA: Diagnosis not present

## 2013-06-13 DIAGNOSIS — Z79899 Other long term (current) drug therapy: Secondary | ICD-10-CM | POA: Diagnosis not present

## 2013-06-15 DIAGNOSIS — Z23 Encounter for immunization: Secondary | ICD-10-CM | POA: Diagnosis not present

## 2013-06-23 ENCOUNTER — Other Ambulatory Visit: Payer: Self-pay | Admitting: Cardiology

## 2013-06-23 DIAGNOSIS — Z79899 Other long term (current) drug therapy: Secondary | ICD-10-CM

## 2013-06-23 DIAGNOSIS — E7800 Pure hypercholesterolemia, unspecified: Secondary | ICD-10-CM

## 2013-06-29 ENCOUNTER — Other Ambulatory Visit (INDEPENDENT_AMBULATORY_CARE_PROVIDER_SITE_OTHER): Payer: Medicare Other

## 2013-06-29 ENCOUNTER — Telehealth: Payer: Self-pay | Admitting: General Surgery

## 2013-06-29 DIAGNOSIS — Z79899 Other long term (current) drug therapy: Secondary | ICD-10-CM | POA: Diagnosis not present

## 2013-06-29 DIAGNOSIS — E7800 Pure hypercholesterolemia, unspecified: Secondary | ICD-10-CM | POA: Diagnosis not present

## 2013-06-29 LAB — LDL CHOLESTEROL, DIRECT: Direct LDL: 139.9 mg/dL

## 2013-06-29 LAB — ALT: ALT: 12 U/L (ref 0–35)

## 2013-06-29 LAB — LIPID PANEL: VLDL: 51.4 mg/dL — ABNORMAL HIGH (ref 0.0–40.0)

## 2013-06-29 NOTE — Telephone Encounter (Signed)
Message copied by Nita Sells on Thu Jun 29, 2013  5:41 PM ------      Message from: Armanda Magic R      Created: Thu Jun 29, 2013  4:27 PM       Poorly controlled lipids - please forward to [facility] for recommendations ------

## 2013-07-03 DIAGNOSIS — Z803 Family history of malignant neoplasm of breast: Secondary | ICD-10-CM | POA: Diagnosis not present

## 2013-07-03 DIAGNOSIS — Z1231 Encounter for screening mammogram for malignant neoplasm of breast: Secondary | ICD-10-CM | POA: Diagnosis not present

## 2013-07-05 ENCOUNTER — Other Ambulatory Visit: Payer: Self-pay | Admitting: General Surgery

## 2013-07-05 ENCOUNTER — Telehealth: Payer: Self-pay | Admitting: General Surgery

## 2013-07-05 DIAGNOSIS — E7800 Pure hypercholesterolemia, unspecified: Secondary | ICD-10-CM

## 2013-07-05 DIAGNOSIS — Z79899 Other long term (current) drug therapy: Secondary | ICD-10-CM

## 2013-07-05 NOTE — Telephone Encounter (Signed)
Pt is aware of results and next lab work.

## 2013-07-05 NOTE — Telephone Encounter (Signed)
Message copied by Nita Sells on Wed Jul 05, 2013 10:17 AM ------      Message from: SMART, Gaspar Skeeters      Created: Fri Jun 30, 2013  1:31 PM       RF:  CAD, HTN, age - LDL goal < 70, non-HDL goal <100.      Meds:  Pravastatin 20 mg qd.      Intolerant:  Multiple statins and Welchol.      LDL was 190 mg/dL off meds, and LDL remains ~ 130-140 mg/dL on pravastatin 20 mg qd.      Given her age and tolerating daily statin, will continue same regimen and check panel 1 year.      Plan:      1. No change in meds.      2.  Recheck lipid panel and hepatic panel in 1 year.      Please notify patient, and set up lab. Thanks. ------

## 2013-08-22 DIAGNOSIS — L02619 Cutaneous abscess of unspecified foot: Secondary | ICD-10-CM | POA: Diagnosis not present

## 2013-08-22 DIAGNOSIS — M79609 Pain in unspecified limb: Secondary | ICD-10-CM | POA: Diagnosis not present

## 2013-08-30 ENCOUNTER — Ambulatory Visit: Payer: Medicare Other | Admitting: Podiatry

## 2013-10-03 DIAGNOSIS — J069 Acute upper respiratory infection, unspecified: Secondary | ICD-10-CM | POA: Diagnosis not present

## 2013-10-03 DIAGNOSIS — J029 Acute pharyngitis, unspecified: Secondary | ICD-10-CM | POA: Diagnosis not present

## 2013-10-03 DIAGNOSIS — R5383 Other fatigue: Secondary | ICD-10-CM | POA: Diagnosis not present

## 2013-10-03 DIAGNOSIS — R6883 Chills (without fever): Secondary | ICD-10-CM | POA: Diagnosis not present

## 2013-10-03 DIAGNOSIS — R5381 Other malaise: Secondary | ICD-10-CM | POA: Diagnosis not present

## 2013-10-09 ENCOUNTER — Other Ambulatory Visit: Payer: Self-pay | Admitting: Cardiology

## 2013-10-24 DIAGNOSIS — H0019 Chalazion unspecified eye, unspecified eyelid: Secondary | ICD-10-CM | POA: Diagnosis not present

## 2013-10-24 DIAGNOSIS — H04129 Dry eye syndrome of unspecified lacrimal gland: Secondary | ICD-10-CM | POA: Diagnosis not present

## 2013-10-24 DIAGNOSIS — Z961 Presence of intraocular lens: Secondary | ICD-10-CM | POA: Diagnosis not present

## 2013-11-15 ENCOUNTER — Encounter: Payer: Self-pay | Admitting: General Surgery

## 2013-11-15 DIAGNOSIS — Z79899 Other long term (current) drug therapy: Secondary | ICD-10-CM

## 2013-11-15 DIAGNOSIS — E7800 Pure hypercholesterolemia, unspecified: Secondary | ICD-10-CM

## 2013-11-15 DIAGNOSIS — I359 Nonrheumatic aortic valve disorder, unspecified: Secondary | ICD-10-CM

## 2013-11-15 DIAGNOSIS — I2510 Atherosclerotic heart disease of native coronary artery without angina pectoris: Secondary | ICD-10-CM

## 2013-11-15 DIAGNOSIS — I10 Essential (primary) hypertension: Secondary | ICD-10-CM

## 2013-11-16 ENCOUNTER — Ambulatory Visit: Payer: Medicare Other | Admitting: Cardiology

## 2013-12-04 ENCOUNTER — Other Ambulatory Visit: Payer: Self-pay | Admitting: *Deleted

## 2013-12-04 MED ORDER — LOSARTAN POTASSIUM 50 MG PO TABS: 50.0000 mg | ORAL_TABLET | Freq: Every day | ORAL | Status: DC

## 2013-12-04 NOTE — Telephone Encounter (Signed)
Per Dr Radford Pax change teveten,  that is not covered under insurance plan [employer], to losartan 50 mg daily.

## 2013-12-06 ENCOUNTER — Telehealth: Payer: Self-pay | Admitting: *Deleted

## 2013-12-06 MED ORDER — NIFEDIPINE ER 90 MG PO TB24: 90.0000 mg | ORAL_TABLET | Freq: Every day | ORAL | Status: DC

## 2013-12-06 NOTE — Telephone Encounter (Signed)
[facility] on [address] requests refill on nifedipine. Thanks, MI

## 2013-12-06 NOTE — Telephone Encounter (Signed)
Refilled, pt has appt 12/13/13

## 2013-12-07 ENCOUNTER — Other Ambulatory Visit: Payer: Self-pay | Admitting: General Surgery

## 2013-12-13 ENCOUNTER — Encounter: Payer: Self-pay | Admitting: Cardiology

## 2013-12-13 ENCOUNTER — Ambulatory Visit (INDEPENDENT_AMBULATORY_CARE_PROVIDER_SITE_OTHER): Payer: Medicare Other | Admitting: Cardiology

## 2013-12-13 VITALS — BP 170/63 | HR 75 | Ht 63.0 in | Wt 119.0 lb

## 2013-12-13 DIAGNOSIS — I10 Essential (primary) hypertension: Secondary | ICD-10-CM

## 2013-12-13 DIAGNOSIS — I2510 Atherosclerotic heart disease of native coronary artery without angina pectoris: Secondary | ICD-10-CM | POA: Diagnosis not present

## 2013-12-13 DIAGNOSIS — I359 Nonrheumatic aortic valve disorder, unspecified: Secondary | ICD-10-CM

## 2013-12-13 DIAGNOSIS — E7800 Pure hypercholesterolemia, unspecified: Secondary | ICD-10-CM | POA: Diagnosis not present

## 2013-12-13 MED ORDER — CLONIDINE HCL 0.1 MG PO TABS: 0.1000 mg | ORAL_TABLET | Freq: Two times a day (BID) | ORAL | Status: DC

## 2013-12-13 NOTE — Progress Notes (Signed)
[place], [place], [place]  [id number] Phone: [phone] Fax:  [phone]  Date:  12/13/2013   ID:  Victoria Banks, DOB 07/01/1921, MRN [id number]  PCP:  Mathews Argyle, MD  Cardiologist:  Fransico Him, MD     History of Present Illness: Victoria Banks is a 80 y.o. female with a history of ASCAD, AS s/p AVR, HTN and dyslipidemia who presents today for followup.  She is doing well.  She denies any chest pain, SOB, DOE, LE edema, palpitations or syncope.   Wt Readings from Last 3 Encounters:  12/13/13 119 lb (53.978 kg)  12/24/11 114 lb (51.71 kg)  12/17/11 114 lb 9.6 oz (51.982 kg)     Past Medical History  Diagnosis Date  . Allergic rhinitis   . Hypertension   . Fibrocystic breast disease   . GERD (gastroesophageal reflux disease)   . Aortic stenosis, severe 08/2009    s/p AVR w pericardial tissue valve 10/2009  . Osteoporosis 12/2010    Reclast given  . Multinodular goiter   . Coronary artery disease 10/2009    S/P SVG to OM   . CRD (chronic renal disease), stage III   . Positive for microalbuminuria     On tevetan  . Benign familial tremor   . Atelectasis     scarring at basis on CXR    Current Outpatient Prescriptions  Medication Sig Dispense Refill  . aspirin 81 MG tablet Take 81 mg by mouth daily.      . calcium citrate-vitamin D (CALCIUM CITRATE +) 315-200 MG-UNIT per tablet Take 1 tablet by mouth 2 (two) times daily.      Marland Kitchen eprosartan (TEVETEN) 600 MG tablet Take 600 mg by mouth daily.      . furosemide (LASIX) 40 MG tablet Take 80 mg by mouth daily.       Marland Kitchen ipratropium (ATROVENT) 0.03 % nasal spray Place 2 sprays into both nostrils 2 (two) times daily.      Marland Kitchen LORazepam (ATIVAN) 0.5 MG tablet Take 0.5 mg by mouth every 8 (eight) hours.      Marland Kitchen MELATONIN PO Take 1 tablet by mouth daily.      . Multiple Vitamins-Minerals (CENTRUM SILVER ULTRA WOMENS PO) Take 1 tablet by mouth daily.      Marland Kitchen NIFEdipine (ADALAT CC) 90 MG 24 hr tablet Take  1 tablet (90 mg total) by mouth daily.  30 tablet  0  . potassium chloride SA (K-DUR,KLOR-CON) 20 MEQ tablet Take 20 mEq by mouth 2 (two) times daily.       No current facility-administered medications for this visit.    Allergies:    Allergies  Allergen Reactions  . Aceon [Perindopril] Other (See Comments)    Chest Pain  . Actonel [Risedronate Sodium]   . Allegra [Fexofenadine] Other (See Comments)    BACK PAIN  . Biaxin [Clarithromycin] Nausea Only  . Ciprofloxacin Nausea Only  . Crestor [Rosuvastatin] Itching  . Fosamax [Alendronate Sodium] Other (See Comments)    Muscle pain  . Sulfa Antibiotics Other (See Comments)    GI Upset  . Lipitor [Atorvastatin] Itching  . Promethazine Hcl Other (See Comments)  . Simvastatin Rash  . Welchol [Colesevelam Hcl] Rash    Social History:  The patient  reports that she has never smoked. She does not have any smokeless tobacco history on file. She reports that she does not drink alcohol or use illicit drugs.   Family History:  The patient's family history is not on file.   ROS:  Please see the history of present illness.      All other systems reviewed and negative.   PHYSICAL EXAM: VS:  BP 170/63  Pulse 75  Ht 5\' 3"  (1.6 m)  Wt 119 lb (53.978 kg)  BMI 21.09 kg/m2 Well nourished, well developed, in no acute distress HEENT: normal Neck: no JVD Cardiac:  normal S1, S2; RRR; 2/6 SM at RUSB Lungs:  clear to auscultation bilaterally, no wheezing, rhonchi or rales Abd: soft, nontender, no hepatomegaly Ext: no edema Skin: warm and dry Neuro:  CNs 2-12 intact, no focal abnormalities noted  EKG:     NSR with nonspecific ST abnormality  ASSESSMENT AND PLAN:  1. Severe AS S/P AVR with pericardial tissue valve 2. 1 vessel ASCAD s/p SVG to OM at time of AVR - continue ASA 3. HTN - elevated today and at home  - Continue Teveten and Adalat  - Add clonidine 0.1mg  BID 4. Dyslipidemia   Nurse visit in 1 week for BP check Followup with  me in 6 months  Signed, Fransico Him, MD 12/13/2013 4:11 PM

## 2013-12-13 NOTE — Patient Instructions (Signed)
Your physician has recommended you make the following change in your medication: 1. Start Clonidine 0.1 MG 1 tablet Twice a day for BP  Your physician recommends that you schedule a follow-up appointment in: One week with Nurse for BP check  Your physician wants you to follow-up in: 6 Months with Dr Mallie Snooks will receive a reminder letter in the mail two months in advance. If you don't receive a letter, please call our office to schedule the follow-up appointment.

## 2013-12-26 ENCOUNTER — Encounter: Payer: Self-pay | Admitting: Cardiology

## 2013-12-26 DIAGNOSIS — R059 Cough, unspecified: Secondary | ICD-10-CM | POA: Diagnosis not present

## 2013-12-26 DIAGNOSIS — I129 Hypertensive chronic kidney disease with stage 1 through stage 4 chronic kidney disease, or unspecified chronic kidney disease: Secondary | ICD-10-CM | POA: Diagnosis not present

## 2013-12-26 DIAGNOSIS — Z1331 Encounter for screening for depression: Secondary | ICD-10-CM | POA: Diagnosis not present

## 2013-12-26 DIAGNOSIS — Z0000 Encounter for general adult medical examination without abnormal findings: Secondary | ICD-10-CM | POA: Diagnosis not present

## 2013-12-26 DIAGNOSIS — Z79899 Other long term (current) drug therapy: Secondary | ICD-10-CM | POA: Diagnosis not present

## 2013-12-26 DIAGNOSIS — E7800 Pure hypercholesterolemia, unspecified: Secondary | ICD-10-CM | POA: Diagnosis not present

## 2013-12-26 DIAGNOSIS — R05 Cough: Secondary | ICD-10-CM | POA: Diagnosis not present

## 2013-12-26 DIAGNOSIS — M810 Age-related osteoporosis without current pathological fracture: Secondary | ICD-10-CM | POA: Diagnosis not present

## 2013-12-26 DIAGNOSIS — N183 Chronic kidney disease, stage 3 unspecified: Secondary | ICD-10-CM | POA: Diagnosis not present

## 2013-12-27 ENCOUNTER — Ambulatory Visit (INDEPENDENT_AMBULATORY_CARE_PROVIDER_SITE_OTHER): Payer: Medicare Other | Admitting: *Deleted

## 2013-12-27 VITALS — BP 128/64 | HR 64 | Wt 121.8 lb

## 2013-12-27 DIAGNOSIS — I10 Essential (primary) hypertension: Secondary | ICD-10-CM | POA: Diagnosis not present

## 2013-12-27 NOTE — Progress Notes (Signed)
Patient reports that she is taking her new medication (Clonidine). She states she does feel like it is helping her elevated BP, however she is reporting side effects.  She states she has moderate "dry mouth" that is very constant and aggravating. She is using hard candies and sips of water to moisten her mouth throughout the day. She states that she has developed a slight cough that persists throughout the day intermittently. She states that she feels "somewhat jittery sometimes after the medication and dizzy". She says that today she was afraid to drive if she took the whole dose, so she only took 1/2 tab of her Clonidine this morning.  Her current BP in office is 128/64, HR 64. She denies headaches or discomforts.  She states she hopes her BP comes down as she is adjusting to her move from her home to an [employer] (which she says was both heartbreaking and stressful for her, including the moving out of her home and selling her belongings).  She does check her BP at home herself "2-3x a day". She was advised to call office anytime she wants to report her BP readings or symptoms. She does want Dr. Radford Pax to be aware of her dry mouth, coughing and dizziness with the new medication. Routed to Dr. Radford Pax.

## 2013-12-28 NOTE — Progress Notes (Signed)
Please have patient decrease her Clonidine to 0.1mg  1/2 tablet twice daily and check her BP daily for a week and call with results and also let us know if her side effect of dizziness and dry mouth and cough abate

## 2013-12-29 ENCOUNTER — Telehealth: Payer: Self-pay | Admitting: Cardiology

## 2013-12-29 NOTE — Telephone Encounter (Signed)
New Message:  Pt states she is returning a call to a nurse from yesterday.

## 2013-12-29 NOTE — Telephone Encounter (Signed)
Pt is aware to decrease her Clonidine 0.1 mg to 1/2 tablet twice a day . Pt is to take her BP daily for a week and call results, also she is to let us know her side effects of dizziness and dry mouth and cough. Pt verbalized understanding.

## 2014-01-03 ENCOUNTER — Other Ambulatory Visit: Payer: Self-pay | Admitting: *Deleted

## 2014-01-03 MED ORDER — PRAVASTATIN SODIUM 20 MG PO TABS: 20.0000 mg | ORAL_TABLET | Freq: Every day | ORAL | Status: DC

## 2014-01-03 MED ORDER — NIFEDIPINE ER 90 MG PO TB24: 90.0000 mg | ORAL_TABLET | Freq: Every day | ORAL | Status: DC

## 2014-01-03 MED ORDER — POTASSIUM CHLORIDE CRYS ER 20 MEQ PO TBCR: 20.0000 meq | EXTENDED_RELEASE_TABLET | Freq: Two times a day (BID) | ORAL | Status: DC

## 2014-01-08 ENCOUNTER — Telehealth: Payer: Self-pay | Admitting: Cardiology

## 2014-01-08 NOTE — Telephone Encounter (Signed)
New message     Still having dry mouth but not dizziness while on clonidine.  Pt was to report to Dr Radford Pax this information.

## 2014-01-08 NOTE — Telephone Encounter (Signed)
Did her dry mouth and dizziness improve on lower dose of Clonidine

## 2014-01-08 NOTE — Telephone Encounter (Signed)
To Dr Turner to advise 

## 2014-01-08 NOTE — Telephone Encounter (Signed)
Please have her stop Clonidine and start Toprol XL 25mg  daily and check BP daily for a week and call with results

## 2014-01-08 NOTE — Telephone Encounter (Signed)
New message     Want Dr Radford Pax to know the average bp checks over a week was 156/59.  She will be out most of the afternoon---OK to leave a message.

## 2014-01-08 NOTE — Telephone Encounter (Signed)
TO Dr Radford Pax to advise.

## 2014-01-08 NOTE — Telephone Encounter (Signed)
LVM for pt to call back and let us know about symptoms with lower dosage of clonidine

## 2014-01-09 ENCOUNTER — Other Ambulatory Visit: Payer: Self-pay | Admitting: General Surgery

## 2014-01-09 MED ORDER — METOPROLOL SUCCINATE ER 25 MG PO TB24: 25.0000 mg | ORAL_TABLET | Freq: Every day | ORAL | Status: DC

## 2014-01-09 NOTE — Telephone Encounter (Signed)
Pt is aware to stop clonidine at start toprol. Rx sent in for pt and med list updated.

## 2014-01-10 ENCOUNTER — Other Ambulatory Visit: Payer: Self-pay | Admitting: General Surgery

## 2014-01-10 ENCOUNTER — Telehealth: Payer: Self-pay | Admitting: Cardiology

## 2014-01-10 MED ORDER — CLONIDINE HCL 0.1 MG PO TABS: 0.0500 mg | ORAL_TABLET | Freq: Two times a day (BID) | ORAL | Status: DC

## 2014-01-10 NOTE — Telephone Encounter (Signed)
Please have her check her BP daily for a week and call with results to make sure current dose of Clonidine is controlling her BP

## 2014-01-10 NOTE — Telephone Encounter (Signed)
I looked on [facility] and it showed she did have allergies to betablockers. It causes her to cough. She said she would continue with the clonidine 0.1mg  tablet

## 2014-01-10 NOTE — Telephone Encounter (Signed)
Made pt aware

## 2014-01-10 NOTE — Telephone Encounter (Signed)
Pt does not have this listed as an allergy in our records. Called pt and LVM for pt to return call to discuss.

## 2014-01-10 NOTE — Telephone Encounter (Signed)
New Message  Pt called states that she was prescribed a Beta Blocker. She states that she is allergic. Requests a call back to discuss an alternative.

## 2014-01-10 NOTE — Telephone Encounter (Signed)
Med list updated

## 2014-01-10 NOTE — Telephone Encounter (Signed)
Med list updated and sending to Dr Radford Pax as a Juluis Rainier. I added the allergy to her allergy list.

## 2014-03-01 ENCOUNTER — Ambulatory Visit: Payer: Medicare Other

## 2014-03-01 ENCOUNTER — Ambulatory Visit (INDEPENDENT_AMBULATORY_CARE_PROVIDER_SITE_OTHER): Payer: Medicare Other | Admitting: Family Medicine

## 2014-03-01 VITALS — BP 136/73 | HR 90 | Temp 97.5°F | Resp 16 | Ht 61.5 in | Wt 118.8 lb

## 2014-03-01 DIAGNOSIS — S81009A Unspecified open wound, unspecified knee, initial encounter: Secondary | ICD-10-CM

## 2014-03-01 DIAGNOSIS — S81809A Unspecified open wound, unspecified lower leg, initial encounter: Secondary | ICD-10-CM

## 2014-03-01 DIAGNOSIS — L02419 Cutaneous abscess of limb, unspecified: Secondary | ICD-10-CM | POA: Diagnosis not present

## 2014-03-01 DIAGNOSIS — S91009A Unspecified open wound, unspecified ankle, initial encounter: Secondary | ICD-10-CM

## 2014-03-01 DIAGNOSIS — M79604 Pain in right leg: Secondary | ICD-10-CM

## 2014-03-01 DIAGNOSIS — M79609 Pain in unspecified limb: Secondary | ICD-10-CM | POA: Diagnosis not present

## 2014-03-01 DIAGNOSIS — L03119 Cellulitis of unspecified part of limb: Secondary | ICD-10-CM | POA: Diagnosis not present

## 2014-03-01 DIAGNOSIS — S81801A Unspecified open wound, right lower leg, initial encounter: Secondary | ICD-10-CM

## 2014-03-01 NOTE — Progress Notes (Addendum)
Subjective:    Patient ID: Victoria Banks, female    DOB: 1921-05-18, 80 y.o.   MRN: [id number] This chart was scribed for Wardell Honour, MD by Anastasia Pall, ED Scribe. This patient was seen in room 11 and the patient's care was started at 4:23 PM.  03/01/2014  Laceration  HPI Victoria Banks is a 80 y.o. female Pt presents to the [facility] for a wound check.   She has a laceration over her right medial shin, onset 2 months ago after she tripped over a box and cut herself on cardboard. She states the wound refuses to heal. She does not have issues with healing wounds normally. She denies having seen her PCP for this. She reports there is pain over the area, but denies any known LE swelling, infections. She reports discoloration in her right LE at baseline. She has h/o varicose veins, is followed by Dr. Renaldo Reel. She had an appointment today with Dr. Renaldo Reel and reports a lot of tenderness when he palpated the area. Before the appointment her wound was flat, but after he palpated it in the office, she states it has gradually become raised.  Wound did bleed a little the other day when removing bandage. No drainage; no redness.  Has been applying Neosporin.  PCP - Mathews Argyle, MD  Review of Systems  Constitutional: Negative for fever.  Skin: Positive for color change (discoloration over right medial shin) and wound (small laceration over right medial shin).   Past Medical History  Diagnosis Date  . Allergic rhinitis   . Hypertension   . Fibrocystic breast disease   . GERD (gastroesophageal reflux disease)   . Aortic stenosis, severe 08/2009    s/p AVR w pericardial tissue valve 10/2009  . Osteoporosis 12/2010    Reclast given  . Multinodular goiter   . Coronary artery disease 10/2009    S/P SVG to OM   . CRD (chronic renal disease), stage III   . Positive for microalbuminuria     On tevetan  . Benign familial tremor   . Atelectasis     scarring at basis on CXR    Allergies  Allergen Reactions  . Aceon [Perindopril] Other (See Comments)    Chest Pain  . Actonel [Risedronate Sodium]   . Allegra [Fexofenadine] Other (See Comments)    BACK PAIN  . Beta Adrenergic Blockers     Serious cough both times she tried.  . Biaxin [Clarithromycin] Nausea Only  . Ciprofloxacin Nausea Only  . Crestor [Rosuvastatin] Itching  . Fosamax [Alendronate Sodium] Other (See Comments)    Muscle pain  . Sulfa Antibiotics Other (See Comments)    GI Upset  . Lipitor [Atorvastatin] Itching  . Promethazine Hcl Other (See Comments)  . Simvastatin Rash  . Welchol [Colesevelam Hcl] Rash   Current Outpatient Prescriptions  Medication Sig Dispense Refill  . aspirin 81 MG tablet Take 81 mg by mouth daily.      . calcium citrate-vitamin D (CALCIUM CITRATE +) 315-200 MG-UNIT per tablet Take 1 tablet by mouth 2 (two) times daily.      . cloNIDine (CATAPRES) 0.1 MG tablet Take 0.5 tablets (0.05 mg total) by mouth 2 (two) times daily.      Marland Kitchen eprosartan (TEVETEN) 600 MG tablet Take 600 mg by mouth daily.      . furosemide (LASIX) 40 MG tablet Take 80 mg by mouth daily.       Marland Kitchen ipratropium (ATROVENT) 0.03 % nasal spray Place  2 sprays into both nostrils 2 (two) times daily.      Marland Kitchen LORazepam (ATIVAN) 0.5 MG tablet Take 0.5 mg by mouth every 8 (eight) hours.      Marland Kitchen MELATONIN PO Take 1 tablet by mouth daily.      . Multiple Vitamins-Minerals (CENTRUM SILVER ULTRA WOMENS PO) Take 1 tablet by mouth daily.      Marland Kitchen NIFEdipine (ADALAT CC) 90 MG 24 hr tablet Take 1 tablet (90 mg total) by mouth daily.  30 tablet  0  . potassium chloride SA (K-DUR,KLOR-CON) 20 MEQ tablet Take 1 tablet (20 mEq total) by mouth 2 (two) times daily.  60 tablet  2  . pravastatin (PRAVACHOL) 20 MG tablet Take 1 tablet (20 mg total) by mouth daily.  30 tablet  2   No current facility-administered medications for this visit.      Objective:    BP 136/73  Pulse 90  Temp(Src) 97.5 F (36.4 C) (Oral)  Resp 16   Ht 5' 1.5" (1.562 m)  Wt 118 lb 12.8 oz (53.887 kg)  BMI 22.09 kg/m2  SpO2 96% Physical Exam  Nursing note and vitals reviewed. Constitutional: She is oriented to person, place, and time. She appears well-developed and well-nourished. No distress.  HENT:  Head: Normocephalic and atraumatic.  Eyes: Conjunctivae and EOM are normal.  Neck: Normal range of motion. No tracheal deviation present.  Cardiovascular: Normal rate and intact distal pulses.   DP pulses intact. Cap refill < 3 seconds.  Pulmonary/Chest: Effort normal. No respiratory distress.  Musculoskeletal: Normal range of motion.  Neurological: She is alert and oriented to person, place, and time.  Skin: Skin is warm and dry.  Hyperpigmented discoloration distal right LE. 8 mm diameter wound raised above the skin without drainage, redness, fluctuants.  Residual dried blood scant; no pustule or vesicles.  Psychiatric: She has a normal mood and affect. Her behavior is normal.   [facility] reading (PRIMARY) by  Dr. Tamala Julian.  R TIB-FIB:  No foreign body.  See separate procedure note.    Assessment & Plan:  Right leg pain - Plan: DG Tibia/Fibula Right  Wound of right leg - Plan: DG Tibia/Fibula Right  1. R leg pain: New. Secondary to RLE wound.   2.  Wound R leg:  New.  S/p exploration; no foreign body identified. Local wound care. If no improvement in 2 weeks, RTC for reevaluation.  May warrant biopsy of area if persistent or may warrant wound care consultation.    No orders of the defined types were placed in this encounter.    No Follow-up on file.    I personally performed the services described in this documentation, which was scribed in my presence.  The recorded information has been reviewed and is accurate.  Reginia Forts, M.D.  [facility] [address] [place], [place]  [address] [phone] phone [phone] fax

## 2014-03-01 NOTE — Patient Instructions (Signed)
1. KEEP WOUND COVERED FOR THE NEXT TWO DAYS.   2. YOU CAN SHOWER AS NORMAL. 3.  AFTER TWO DAYS, KEEP WOUND COVERED DURING THE DAY AND REMOVE BANDAGE AT NIGHT. 4.  CLEAN WOUND WITH SOAP AND WATER ONCE DAILY. 5. IF WOUND IS NOT SIGNIFICANTLY BETTER IN TWO WEEKS,PLEASE FOLLOW-UP WITH DR. Felipa Eth OR WITH DR. Tamala Julian AT [place].

## 2014-03-01 NOTE — Progress Notes (Signed)
VCO. Local anesthesia with 2% lidocaine plain. Betadine prep. Cross incision made across lesion.  No FB identified. No purulent material expressed. Serosanguinous drainage only. Hemostasis obtained. Cleaned and bandaged.  Patient tolerated well.

## 2014-03-09 DIAGNOSIS — L989 Disorder of the skin and subcutaneous tissue, unspecified: Secondary | ICD-10-CM | POA: Diagnosis not present

## 2014-04-05 ENCOUNTER — Other Ambulatory Visit: Payer: Self-pay

## 2014-04-05 MED ORDER — POTASSIUM CHLORIDE CRYS ER 20 MEQ PO TBCR: 20.0000 meq | EXTENDED_RELEASE_TABLET | Freq: Two times a day (BID) | ORAL | Status: DC

## 2014-04-05 MED ORDER — PRAVASTATIN SODIUM 20 MG PO TABS: 20.0000 mg | ORAL_TABLET | Freq: Every day | ORAL | Status: DC

## 2014-05-17 ENCOUNTER — Telehealth: Payer: Self-pay | Admitting: Cardiology

## 2014-05-17 NOTE — Telephone Encounter (Signed)
What would be the dose equivalent of Losartan if switched from Tevetan to Losartan?

## 2014-05-17 NOTE — Telephone Encounter (Signed)
To Dr Turner to advise 

## 2014-05-17 NOTE — Telephone Encounter (Signed)
New Message  Pt called for an alternative to eprosartan (TEVETEN) 600 MG tablet.... [facility] is no longer filling the medication.. Pt wanted to inform Dr. Parks Ranger she cannot take beta blockers... Please call back to discuss.   Marland Kitchen

## 2014-05-18 DIAGNOSIS — R42 Dizziness and giddiness: Secondary | ICD-10-CM | POA: Diagnosis not present

## 2014-05-18 NOTE — Telephone Encounter (Signed)
Patient called to follow up on message from yesterday about switching her medication. Please advise. Thanks, MI

## 2014-05-21 ENCOUNTER — Other Ambulatory Visit: Payer: Self-pay | Admitting: *Deleted

## 2014-05-21 MED ORDER — LOSARTAN POTASSIUM 100 MG PO TABS: 100.0000 mg | ORAL_TABLET | Freq: Every day | ORAL | Status: DC

## 2014-05-21 NOTE — Telephone Encounter (Signed)
Patient called to follow up on what she would be switched to for replacement of the tevetan. I made her aware the she would start losartan 100mg  qd. She verbalized understanding and will pick up the rx today as she is out of the tevetan.

## 2014-05-21 NOTE — Addendum Note (Signed)
Addended by: Lily Kocher on: 05/21/2014 10:34 AM   Modules accepted: Orders, Medications

## 2014-05-21 NOTE — Telephone Encounter (Signed)
Refills sent in medication for pt. Will take other medication off med list for pt.

## 2014-05-21 NOTE — Telephone Encounter (Signed)
Losartan 100 mg once daily would be appropriate.  Please notify patient and send in prescription.  Thanks.

## 2014-06-19 ENCOUNTER — Encounter: Payer: Self-pay | Admitting: Cardiology

## 2014-06-19 ENCOUNTER — Ambulatory Visit (INDEPENDENT_AMBULATORY_CARE_PROVIDER_SITE_OTHER): Payer: Medicare Other | Admitting: Cardiology

## 2014-06-19 ENCOUNTER — Telehealth: Payer: Self-pay | Admitting: Cardiology

## 2014-06-19 VITALS — BP 128/70 | HR 70 | Ht 61.5 in | Wt 120.1 lb

## 2014-06-19 DIAGNOSIS — I10 Essential (primary) hypertension: Secondary | ICD-10-CM

## 2014-06-19 DIAGNOSIS — E7800 Pure hypercholesterolemia, unspecified: Secondary | ICD-10-CM

## 2014-06-19 DIAGNOSIS — R600 Localized edema: Secondary | ICD-10-CM

## 2014-06-19 DIAGNOSIS — I2510 Atherosclerotic heart disease of native coronary artery without angina pectoris: Secondary | ICD-10-CM | POA: Diagnosis not present

## 2014-06-19 DIAGNOSIS — I359 Nonrheumatic aortic valve disorder, unspecified: Secondary | ICD-10-CM

## 2014-06-19 DIAGNOSIS — R609 Edema, unspecified: Secondary | ICD-10-CM

## 2014-06-19 MED ORDER — CLONIDINE HCL 0.2 MG/24HR TD PTWK: 0.2000 mg | MEDICATED_PATCH | TRANSDERMAL | Status: DC

## 2014-06-19 MED ORDER — FUROSEMIDE 40 MG PO TABS: 40.0000 mg | ORAL_TABLET | Freq: Every day | ORAL | Status: DC

## 2014-06-19 MED ORDER — POTASSIUM CHLORIDE CRYS ER 20 MEQ PO TBCR: 20.0000 meq | EXTENDED_RELEASE_TABLET | Freq: Every day | ORAL | Status: DC

## 2014-06-19 MED ORDER — LOSARTAN POTASSIUM 100 MG PO TABS: 100.0000 mg | ORAL_TABLET | Freq: Every day | ORAL | Status: DC

## 2014-06-19 NOTE — Telephone Encounter (Signed)
I have reviewed patient's lipid panel from 11/2013 which showed a LDL of 121.  She was placed on pravastatin.  Please recheck FLP and ALT

## 2014-06-19 NOTE — Progress Notes (Signed)
[place], [place],   [address] Phone: [phone] Fax:  [phone]  Date:  06/19/2014   ID:  Victoria Banks, DOB [birth year], MRN [id number]  PCP:  Mathews Argyle, MD  Cardiologist:  Fransico Him, MD    History of Present Illness: Victoria Banks is a 80 y.o. female with a history of ASCAD, AS s/p AVR, HTN and dyslipidemia who presents today for followup. She is doing well. She denies any chest pain, SOB, DOE, LE edema, palpitations or syncope. She complains that the Clonidine causes dry mouth.   Wt Readings from Last 3 Encounters:  06/19/14 120 lb 1.9 oz (54.486 kg)  03/01/14 118 lb 12.8 oz (53.887 kg)  12/27/13 121 lb 12.8 oz (55.248 kg)     Past Medical History  Diagnosis Date  . Allergic rhinitis   . Hypertension   . Fibrocystic breast disease   . GERD (gastroesophageal reflux disease)   . Aortic stenosis, severe 08/2009    s/p AVR w pericardial tissue valve 10/2009  . Osteoporosis 12/2010    Reclast given  . Multinodular goiter   . Coronary artery disease 10/2009    S/P SVG to OM   . CRD (chronic renal disease), stage III   . Positive for microalbuminuria     On tevetan  . Benign familial tremor   . Atelectasis     scarring at basis on CXR    Current Outpatient Prescriptions  Medication Sig Dispense Refill  . aspirin 81 MG tablet Take 81 mg by mouth daily.      . calcium citrate-vitamin D (CALCIUM CITRATE +) 315-200 MG-UNIT per tablet Take 1 tablet by mouth 2 (two) times daily.      . cloNIDine (CATAPRES) 0.1 MG tablet Take 0.5 tablets (0.05 mg total) by mouth 2 (two) times daily.      . furosemide (LASIX) 40 MG tablet Take 80 mg by mouth daily.       Marland Kitchen ipratropium (ATROVENT) 0.03 % nasal spray Place 2 sprays into both nostrils 2 (two) times daily.      Marland Kitchen LORazepam (ATIVAN) 0.5 MG tablet Take 0.5 mg by mouth every 8 (eight) hours.      Marland Kitchen MELATONIN PO Take 1 tablet by mouth daily.      . Multiple Vitamins-Minerals (CENTRUM SILVER  ULTRA WOMENS PO) Take 1 tablet by mouth daily.      Marland Kitchen NIFEdipine (ADALAT CC) 90 MG 24 hr tablet Take 1 tablet (90 mg total) by mouth daily.  30 tablet  0  . potassium chloride SA (K-DUR,KLOR-CON) 20 MEQ tablet Take 1 tablet (20 mEq total) by mouth 2 (two) times daily.  60 tablet  2  . pravastatin (PRAVACHOL) 20 MG tablet Take 1 tablet (20 mg total) by mouth daily.  30 tablet  2   No current facility-administered medications for this visit.    Allergies:    Allergies  Allergen Reactions  . Aceon [Perindopril] Other (See Comments)    Chest Pain  . Actonel [Risedronate Sodium]   . Allegra [Fexofenadine] Other (See Comments)    BACK PAIN  . Beta Adrenergic Blockers     Serious cough both times she tried.  . Biaxin [Clarithromycin] Nausea Only  . Ciprofloxacin Nausea Only  . Crestor [Rosuvastatin] Itching  . Fosamax [Alendronate Sodium] Other (See Comments)    Muscle pain  . Sulfa Antibiotics Other (See Comments)    GI Upset  . Lipitor [Atorvastatin] Itching  . Promethazine Hcl  Other (See Comments)  . Simvastatin Rash  . Welchol [Colesevelam Hcl] Rash    Social History:  The patient  reports that she has never smoked. She does not have any smokeless tobacco history on file. She reports that she does not drink alcohol or use illicit drugs.   Family History:  The patient's family history is not on file.   ROS:  Please see the history of present illness.      All other systems reviewed and negative.   PHYSICAL EXAM: VS:  BP 128/70  Pulse 70  Ht 5' 1.5" (1.562 m)  Wt 120 lb 1.9 oz (54.486 kg)  BMI 22.33 kg/m2 Well nourished, well developed, in no acute distress HEENT: normal Neck: no JVD Cardiac:  normal S1, S2; RRR; no murmur Lungs:  clear to auscultation bilaterally, no wheezing, rhonchi or rales Abd: soft, nontender, no hepatomegaly Ext: no edema Skin: warm and dry Neuro:  CNs 2-12 intact, no focal abnormalities noted      ASSESSMENT AND PLAN:  1. Severe AS S/P AVR  with pericardial tissue valve 2. 1 vessel ASCAD s/p SVG to OM at time of AVR - continue ASA  3. HTN - elevated today and at home.  She does not tolerate the clonidine tablets due to dry mouth.   - Continue Losartan and Adalat  - change Clonidine to Catapress #2 weekly in hopes that she may tolerate the patch more with less dry mouth.  She has a lot of med intolerances so we do not have many options for treating HTN.  Her SBP is in the 160-170 range in the afternoon. - check BP daily for a week and call with results 4. Dyslipidemia - continue pravastatin - check FLP with ALT with Dr. Felipa Eth 5. LE edema - she has none on exam today.  I have asked her to decrease her Lasix to 40mg  daily and decrease Kdur to once daily  Followup with me in 6 months    Signed, Fransico Him, MD [facility] 06/19/2014 10:51 AM

## 2014-06-19 NOTE — Patient Instructions (Signed)
Your physician has recommended you make the following change in your medication: 1. Continue Losartan 100 MG daily 2. Decrease Lasix to 40 MG daily 3. Decrease Potassium to 1 tablet daily 4. Stop Clonidine 5. Start Catapress #2 1 patch weekly  Your physician has requested that you regularly monitor and record your blood pressure readings at home. Please use the same machine at the same time of day to check your readings and record them for one week and call us with the results.   Your physician wants you to follow-up in: 6 months with Dr Mallie Snooks will receive a reminder letter in the mail two months in advance. If you don't receive a letter, please call our office to schedule the follow-up appointment.

## 2014-06-20 ENCOUNTER — Other Ambulatory Visit: Payer: Self-pay | Admitting: General Surgery

## 2014-06-20 ENCOUNTER — Encounter: Payer: Self-pay | Admitting: General Surgery

## 2014-06-20 DIAGNOSIS — N183 Chronic kidney disease, stage 3 unspecified: Secondary | ICD-10-CM | POA: Diagnosis not present

## 2014-06-20 DIAGNOSIS — E7800 Pure hypercholesterolemia, unspecified: Secondary | ICD-10-CM | POA: Diagnosis not present

## 2014-06-20 DIAGNOSIS — I129 Hypertensive chronic kidney disease with stage 1 through stage 4 chronic kidney disease, or unspecified chronic kidney disease: Secondary | ICD-10-CM | POA: Diagnosis not present

## 2014-06-20 DIAGNOSIS — J309 Allergic rhinitis, unspecified: Secondary | ICD-10-CM | POA: Diagnosis not present

## 2014-06-20 DIAGNOSIS — Z79899 Other long term (current) drug therapy: Secondary | ICD-10-CM | POA: Diagnosis not present

## 2014-06-20 NOTE — Telephone Encounter (Signed)
Labs set up for October 8th.

## 2014-06-25 ENCOUNTER — Telehealth: Payer: Self-pay | Admitting: Cardiology

## 2014-06-25 NOTE — Telephone Encounter (Signed)
New message      Patient said Dr Radford Pax told her to call Danielle today

## 2014-06-25 NOTE — Telephone Encounter (Signed)
BP still too high - increase Catapress patch to TTS #3  Weekly and check BP daily for a week and call with results

## 2014-06-25 NOTE — Telephone Encounter (Signed)
Pt stated she had to spend 45 dollars on the patches and she does not want to have to spend another 45 dollars to get the higher dosage. She wants to know if she can continue with the patch and add on a clonidine pill or what other option she has.

## 2014-06-25 NOTE — Telephone Encounter (Signed)
Called Pt get BP readings from the past week.   9/24: AM 153/68  PM 164/66 9/25: AM 159/63  PM 168/66 9/26: AM 147/60  PM 183/72 9/27: AM 149/57  PM 167/66 9/28: AM 167/70  Pt stated she saw Dr Felipa Eth last Thursday and she is on a strong antibiotics due to infection in her lung. 875 MG of Amoxicillin for 10 Days.

## 2014-06-25 NOTE — Telephone Encounter (Signed)
Add Hydralazine at 10mg  TID and keep Catapress patch at #2.  Check BP daily for a week and call with results.

## 2014-06-26 MED ORDER — HYDRALAZINE HCL 10 MG PO TABS: 10.0000 mg | ORAL_TABLET | Freq: Three times a day (TID) | ORAL | Status: DC

## 2014-06-26 NOTE — Telephone Encounter (Signed)
Pt is aware. Med list updated and Rx sent in to  [facility]. Pt will check her BP for a week and call us with the results.

## 2014-07-04 DIAGNOSIS — Z1231 Encounter for screening mammogram for malignant neoplasm of breast: Secondary | ICD-10-CM | POA: Diagnosis not present

## 2014-07-04 DIAGNOSIS — Z803 Family history of malignant neoplasm of breast: Secondary | ICD-10-CM | POA: Diagnosis not present

## 2014-07-05 ENCOUNTER — Other Ambulatory Visit (INDEPENDENT_AMBULATORY_CARE_PROVIDER_SITE_OTHER): Payer: Medicare Other | Admitting: *Deleted

## 2014-07-05 DIAGNOSIS — Z79899 Other long term (current) drug therapy: Secondary | ICD-10-CM

## 2014-07-05 DIAGNOSIS — E7800 Pure hypercholesterolemia, unspecified: Secondary | ICD-10-CM

## 2014-07-05 LAB — HEPATIC FUNCTION PANEL
ALBUMIN: 3.4 g/dL — AB (ref 3.5–5.2)
ALT: 14 U/L (ref 0–35)
AST: 22 U/L (ref 0–37)
Alkaline Phosphatase: 44 U/L (ref 39–117)
Bilirubin, Direct: 0 mg/dL (ref 0.0–0.3)
TOTAL PROTEIN: 7.4 g/dL (ref 6.0–8.3)
Total Bilirubin: 0.4 mg/dL (ref 0.2–1.2)

## 2014-07-05 LAB — LIPID PANEL
Cholesterol: 216 mg/dL — ABNORMAL HIGH (ref 0–200)
HDL: 44.6 mg/dL (ref >39.00)
LDL Cholesterol: 145 mg/dL — ABNORMAL HIGH (ref 0–99)
NONHDL: 171.4
Total CHOL/HDL Ratio: 5
Triglycerides: 130 mg/dL (ref 0.0–149.0)
VLDL: 26 mg/dL (ref 0.0–40.0)

## 2014-07-09 ENCOUNTER — Telehealth: Payer: Self-pay | Admitting: Cardiology

## 2014-07-09 DIAGNOSIS — Z79899 Other long term (current) drug therapy: Secondary | ICD-10-CM

## 2014-07-09 DIAGNOSIS — E7800 Pure hypercholesterolemia, unspecified: Secondary | ICD-10-CM

## 2014-07-09 MED ORDER — PRAVASTATIN SODIUM 40 MG PO TABS: 40.0000 mg | ORAL_TABLET | Freq: Every evening | ORAL | Status: DC

## 2014-07-09 NOTE — Telephone Encounter (Signed)
Patient informed of labs and the need to increase Pravastatin to 40mg  daily. New Rx to be sent to [facility]. Patient will return 11/24 for fasting lipid/liver

## 2014-07-09 NOTE — Telephone Encounter (Signed)
New message     Returning a nurses call from friday

## 2014-07-11 DIAGNOSIS — R928 Other abnormal and inconclusive findings on diagnostic imaging of breast: Secondary | ICD-10-CM | POA: Diagnosis not present

## 2014-07-18 ENCOUNTER — Telehealth: Payer: Self-pay | Admitting: Cardiology

## 2014-07-18 DIAGNOSIS — Z79899 Other long term (current) drug therapy: Secondary | ICD-10-CM | POA: Diagnosis not present

## 2014-07-18 NOTE — Telephone Encounter (Signed)
Left pt a message to call back. 

## 2014-07-18 NOTE — Telephone Encounter (Signed)
Pt is aware that the last phone call was on 07/09/14 and the nurse spoke with her about the labs and MD recommendations pt verbalized understanding.

## 2014-07-18 NOTE — Telephone Encounter (Signed)
New message    Patient calling stating someone called her this morning. She returning call back

## 2014-07-23 ENCOUNTER — Telehealth: Payer: Self-pay | Admitting: Cardiology

## 2014-07-23 NOTE — Telephone Encounter (Signed)
New Message     Pt calling stating Dr. Radford Pax changed her medications the last time she was seen and since then the pt is complaining of retaining water and feeling very weak. Pt wants guidance. Please call pt to advise.

## 2014-07-23 NOTE — Telephone Encounter (Signed)
Pt states she was very SOB over the weekend.  She does not weigh regularly, so she is not sure about weight gain. She states she  is breathing better today and feels like she got rid of most of fluid.

## 2014-07-23 NOTE — Telephone Encounter (Signed)
Pt states she took her medication about 10 minutes ago. Pt states she took lasix 80mg  daily over the weekend instead of lasix 40mg  daily and wants Dr Radford Pax to know she is going to stay on lasix 80mg  daily regularly.  I will forward to Dr Radford Pax for review.

## 2014-07-23 NOTE — Telephone Encounter (Signed)
Pt states her BP currently is 160/59 and her heart rate is 79.

## 2014-07-23 NOTE — Telephone Encounter (Signed)
Has her breathing improved?

## 2014-07-24 ENCOUNTER — Inpatient Hospital Stay (HOSPITAL_COMMUNITY)
Admission: EM | Admit: 2014-07-24 | Discharge: 2014-07-27 | DRG: 291 | Disposition: A | Discharge status: Home / Self Care | Payer: Medicare Other | Attending: Family Medicine | Admitting: Family Medicine

## 2014-07-24 ENCOUNTER — Encounter (HOSPITAL_COMMUNITY): Payer: Self-pay | Admitting: Emergency Medicine

## 2014-07-24 ENCOUNTER — Emergency Department (HOSPITAL_COMMUNITY): Payer: Medicare Other

## 2014-07-24 DIAGNOSIS — E7800 Pure hypercholesterolemia, unspecified: Secondary | ICD-10-CM

## 2014-07-24 DIAGNOSIS — I6782 Cerebral ischemia: Secondary | ICD-10-CM | POA: Diagnosis not present

## 2014-07-24 DIAGNOSIS — Z882 Allergy status to sulfonamides status: Secondary | ICD-10-CM | POA: Diagnosis not present

## 2014-07-24 DIAGNOSIS — Z952 Presence of prosthetic heart valve: Secondary | ICD-10-CM

## 2014-07-24 DIAGNOSIS — I5031 Acute diastolic (congestive) heart failure: Principal | ICD-10-CM | POA: Diagnosis present

## 2014-07-24 DIAGNOSIS — I350 Nonrheumatic aortic (valve) stenosis: Secondary | ICD-10-CM | POA: Diagnosis present

## 2014-07-24 DIAGNOSIS — N184 Chronic kidney disease, stage 4 (severe): Secondary | ICD-10-CM

## 2014-07-24 DIAGNOSIS — J90 Pleural effusion, not elsewhere classified: Secondary | ICD-10-CM | POA: Diagnosis not present

## 2014-07-24 DIAGNOSIS — R600 Localized edema: Secondary | ICD-10-CM

## 2014-07-24 DIAGNOSIS — I129 Hypertensive chronic kidney disease with stage 1 through stage 4 chronic kidney disease, or unspecified chronic kidney disease: Secondary | ICD-10-CM | POA: Diagnosis present

## 2014-07-24 DIAGNOSIS — Z79899 Other long term (current) drug therapy: Secondary | ICD-10-CM | POA: Diagnosis not present

## 2014-07-24 DIAGNOSIS — M810 Age-related osteoporosis without current pathological fracture: Secondary | ICD-10-CM | POA: Diagnosis present

## 2014-07-24 DIAGNOSIS — R0602 Shortness of breath: Secondary | ICD-10-CM | POA: Diagnosis not present

## 2014-07-24 DIAGNOSIS — Z7982 Long term (current) use of aspirin: Secondary | ICD-10-CM | POA: Diagnosis not present

## 2014-07-24 DIAGNOSIS — Z888 Allergy status to other drugs, medicaments and biological substances status: Secondary | ICD-10-CM | POA: Diagnosis not present

## 2014-07-24 DIAGNOSIS — I5022 Chronic systolic (congestive) heart failure: Secondary | ICD-10-CM | POA: Diagnosis not present

## 2014-07-24 DIAGNOSIS — K219 Gastro-esophageal reflux disease without esophagitis: Secondary | ICD-10-CM | POA: Diagnosis present

## 2014-07-24 DIAGNOSIS — Z951 Presence of aortocoronary bypass graft: Secondary | ICD-10-CM

## 2014-07-24 DIAGNOSIS — I2510 Atherosclerotic heart disease of native coronary artery without angina pectoris: Secondary | ICD-10-CM | POA: Diagnosis present

## 2014-07-24 DIAGNOSIS — I10 Essential (primary) hypertension: Secondary | ICD-10-CM | POA: Diagnosis not present

## 2014-07-24 DIAGNOSIS — J9601 Acute respiratory failure with hypoxia: Secondary | ICD-10-CM | POA: Diagnosis present

## 2014-07-24 DIAGNOSIS — T501X5A Adverse effect of loop [high-ceiling] diuretics, initial encounter: Secondary | ICD-10-CM | POA: Diagnosis present

## 2014-07-24 DIAGNOSIS — I509 Heart failure, unspecified: Secondary | ICD-10-CM

## 2014-07-24 DIAGNOSIS — R200 Anesthesia of skin: Secondary | ICD-10-CM

## 2014-07-24 DIAGNOSIS — I700 Atherosclerosis of aorta: Secondary | ICD-10-CM | POA: Diagnosis not present

## 2014-07-24 DIAGNOSIS — R202 Paresthesia of skin: Secondary | ICD-10-CM | POA: Diagnosis not present

## 2014-07-24 DIAGNOSIS — I059 Rheumatic mitral valve disease, unspecified: Secondary | ICD-10-CM | POA: Diagnosis not present

## 2014-07-24 DIAGNOSIS — R069 Unspecified abnormalities of breathing: Secondary | ICD-10-CM | POA: Diagnosis not present

## 2014-07-24 LAB — CBC WITH DIFFERENTIAL/PLATELET
Basophils Absolute: 0 10*3/uL (ref 0.0–0.1)
Basophils Relative: 0 % (ref 0–1)
Eosinophils Absolute: 0.1 10*3/uL (ref 0.0–0.7)
Eosinophils Relative: 1 % (ref 0–5)
HCT: 34.8 % — ABNORMAL LOW (ref 36.0–46.0)
HEMOGLOBIN: 11.7 g/dL — AB (ref 12.0–15.0)
Lymphocytes Relative: 16 % (ref 12–46)
Lymphs Abs: 1.3 10*3/uL (ref 0.7–4.0)
MCH: 29 pg (ref 26.0–34.0)
MCHC: 33.6 g/dL (ref 30.0–36.0)
MCV: 86.1 fL (ref 78.0–100.0)
MONOS PCT: 5 % (ref 3–12)
Monocytes Absolute: 0.4 10*3/uL (ref 0.1–1.0)
NEUTROS ABS: 6.3 10*3/uL (ref 1.7–7.7)
Neutrophils Relative %: 78 % — ABNORMAL HIGH (ref 43–77)
Platelets: 221 10*3/uL (ref 150–400)
RBC: 4.04 MIL/uL (ref 3.87–5.11)
RDW: 13.5 % (ref 11.5–15.5)
WBC: 8.1 10*3/uL (ref 4.0–10.5)

## 2014-07-24 LAB — CBC
HEMATOCRIT: 35.7 % — AB (ref 36.0–46.0)
Hemoglobin: 11.9 g/dL — ABNORMAL LOW (ref 12.0–15.0)
MCH: 28.1 pg (ref 26.0–34.0)
MCHC: 33.3 g/dL (ref 30.0–36.0)
MCV: 84.4 fL (ref 78.0–100.0)
Platelets: 251 10*3/uL (ref 150–400)
RBC: 4.23 MIL/uL (ref 3.87–5.11)
RDW: 13.5 % (ref 11.5–15.5)
WBC: 6.8 10*3/uL (ref 4.0–10.5)

## 2014-07-24 LAB — COMPREHENSIVE METABOLIC PANEL
ALT: 26 U/L (ref 0–35)
AST: 25 U/L (ref 0–37)
Albumin: 3.5 g/dL (ref 3.5–5.2)
Alkaline Phosphatase: 54 U/L (ref 39–117)
Anion gap: 17 — ABNORMAL HIGH (ref 5–15)
BUN: 23 mg/dL (ref 6–23)
CALCIUM: 9.4 mg/dL (ref 8.4–10.5)
CO2: 21 meq/L (ref 19–32)
Chloride: 94 mEq/L — ABNORMAL LOW (ref 96–112)
Creatinine, Ser: 1.55 mg/dL — ABNORMAL HIGH (ref 0.50–1.10)
GFR calc Af Amer: 33 mL/min — ABNORMAL LOW (ref >90)
GFR calc non Af Amer: 28 mL/min — ABNORMAL LOW (ref >90)
Glucose, Bld: 123 mg/dL — ABNORMAL HIGH (ref 70–99)
Potassium: 4.4 mEq/L (ref 3.7–5.3)
SODIUM: 132 meq/L — AB (ref 137–147)
Total Bilirubin: 0.4 mg/dL (ref 0.3–1.2)
Total Protein: 7.3 g/dL (ref 6.0–8.3)

## 2014-07-24 LAB — I-STAT TROPONIN, ED: Troponin i, poc: 0.02 ng/mL (ref 0.00–0.08)

## 2014-07-24 LAB — CREATININE, SERUM
Creatinine, Ser: 1.65 mg/dL — ABNORMAL HIGH (ref 0.50–1.10)
GFR calc non Af Amer: 26 mL/min — ABNORMAL LOW (ref >90)
GFR, EST AFRICAN AMERICAN: 30 mL/min — AB (ref >90)

## 2014-07-24 LAB — TROPONIN I

## 2014-07-24 LAB — PRO B NATRIURETIC PEPTIDE: Pro B Natriuretic peptide (BNP): 3852 pg/mL — ABNORMAL HIGH (ref 0–450)

## 2014-07-24 MED ORDER — HYDRALAZINE HCL 10 MG PO TABS
10.0000 mg | ORAL_TABLET | Freq: Three times a day (TID) | ORAL | Status: DC
  Administered 2014-07-24 – 2014-07-27 (×8): 10 mg via ORAL
  Filled 2014-07-24 (×10): qty 1

## 2014-07-24 MED ORDER — FUROSEMIDE 10 MG/ML IJ SOLN: 80.0000 mg | Freq: Once | INTRAMUSCULAR | Status: DC

## 2014-07-24 MED ORDER — SODIUM CHLORIDE 0.9 % IV SOLN
250.0000 mL | INTRAVENOUS | Status: DC | PRN
Start: 2014-07-24 — End: 2014-07-27

## 2014-07-24 MED ORDER — HEPARIN SODIUM (PORCINE) 5000 UNIT/ML IJ SOLN
5000.0000 [IU] | Freq: Three times a day (TID) | INTRAMUSCULAR | Status: DC
  Administered 2014-07-24 – 2014-07-27 (×9): 5000 [IU] via SUBCUTANEOUS
  Filled 2014-07-24 (×10): qty 1

## 2014-07-24 MED ORDER — FUROSEMIDE 10 MG/ML IJ SOLN
40.0000 mg | Freq: Once | INTRAMUSCULAR | Status: AC
  Administered 2014-07-24: 40 mg via INTRAVENOUS
  Filled 2014-07-24: qty 4

## 2014-07-24 MED ORDER — PRAVASTATIN SODIUM 40 MG PO TABS
40.0000 mg | ORAL_TABLET | Freq: Every evening | ORAL | Status: DC
  Administered 2014-07-24 – 2014-07-26 (×3): 40 mg via ORAL
  Filled 2014-07-24 (×4): qty 1

## 2014-07-24 MED ORDER — SODIUM CHLORIDE 0.9 % IJ SOLN: 3.0000 mL | INTRAMUSCULAR | Status: DC | PRN

## 2014-07-24 MED ORDER — ASPIRIN 81 MG PO TABS: 81.0000 mg | ORAL_TABLET | Freq: Every day | ORAL | Status: DC

## 2014-07-24 MED ORDER — NITROGLYCERIN 0.4 MG SL SUBL: 0.4000 mg | SUBLINGUAL_TABLET | SUBLINGUAL | Status: DC | PRN

## 2014-07-24 MED ORDER — SODIUM CHLORIDE 0.9 % IJ SOLN
3.0000 mL | Freq: Two times a day (BID) | INTRAMUSCULAR | Status: DC
  Administered 2014-07-27: 3 mL via INTRAVENOUS

## 2014-07-24 MED ORDER — SODIUM CHLORIDE 0.9 % IJ SOLN
3.0000 mL | Freq: Two times a day (BID) | INTRAMUSCULAR | Status: DC
  Administered 2014-07-24 – 2014-07-27 (×6): 3 mL via INTRAVENOUS

## 2014-07-24 MED ORDER — POTASSIUM CHLORIDE CRYS ER 20 MEQ PO TBCR
20.0000 meq | EXTENDED_RELEASE_TABLET | Freq: Two times a day (BID) | ORAL | Status: DC
  Administered 2014-07-24 – 2014-07-27 (×6): 20 meq via ORAL
  Filled 2014-07-24 (×7): qty 1

## 2014-07-24 MED ORDER — ASPIRIN EC 81 MG PO TBEC
81.0000 mg | DELAYED_RELEASE_TABLET | Freq: Every day | ORAL | Status: DC
  Administered 2014-07-25 – 2014-07-27 (×3): 81 mg via ORAL
  Filled 2014-07-24 (×3): qty 1

## 2014-07-24 MED ORDER — LORAZEPAM 0.5 MG PO TABS
0.5000 mg | ORAL_TABLET | Freq: Every day | ORAL | Status: DC
  Administered 2014-07-24 – 2014-07-26 (×3): 0.5 mg via ORAL
  Filled 2014-07-24 (×3): qty 1

## 2014-07-24 NOTE — Telephone Encounter (Signed)
Ms. Bowe states she she cannot catch her breath since Friday. She has been taking 80 mg Lasix for the last few days with no help.  She states that her daughter is coming and she is taking her to the hospital for answers.   Forwarding to Dr. Radford Pax for review.

## 2014-07-24 NOTE — ED Notes (Signed)
Pt was 88% on room air, placed on 2L [place].

## 2014-07-24 NOTE — Telephone Encounter (Signed)
Please find out if she went to ER

## 2014-07-24 NOTE — Progress Notes (Signed)
Patient admitted to [facility] at 29 with no complaints of pain.  She was given lasix in the emergency department and has had a good amount of output from that.  Patient blood pressures have been high.  Paged oncall MD for internal medicine about ordering her home medication hydralazine.  Will continue to monitor.

## 2014-07-24 NOTE — ED Notes (Signed)
Per EMS- pt lives at [facility] independent living facility on [address]. Pt c/o dry cough since Friday, shortness of breath. 90% on room air. Pt also c/o chest tightness, substernal.

## 2014-07-24 NOTE — H&P (Addendum)
Hospitalist Admission History and Physical  Patient name: Victoria Banks Medical record number: [id number] Date of birth: June 16, 1921 Age: 80 y.o. Gender: female  Primary Care Provider: Mathews Argyle, MD Cardiologist: Radford Pax  Chief Complaint: acute resp failure with hypoxia, acute on chronic heart failure  History of Present Illness:This is a 80 y.o. year old female with significant past medical history of severe aortic stenosis s/p AVR (pericardial tissue), CAD s/p CABG, HTN presenting with acute resp failure with hypoxia, acute on chronic heart failure. Patient reports increased work of breathing the past 3-4 days. Has had progressive lower extremity swelling and orthopnea at home. States that her Lasix dose was decreased by half approximately one month ago by cardiology. States she has been eating salty foods. Reports compliance to her medication regimen. Does report that she missed one of her blood pressure medication doses today. Blood pressure has been running in the 160s at home. Denies any chest pain. Has been checking her weight on a regular basis. Denies any significant increase. Baseline weight is 120 pounds per patient. Presented to the ER MAXIMUM TEMPERATURE 98, heart rate in the 50s to 70s, blood pressure in the 150s to 170s, satting in the upper 80s with ambulation. Swish to 2 L nasal cannula-now satting greater than 96%. Does not wear home oxygen. White blood cell count 8.1, hemoglobin 11.7, sodium 132, creatinine 1.55. Troponin negative 1. ProBNP 3850-baseline around 500. EKG normal sinus rhythm. CXR shows small bilateral pleural effusions, cardiomegaly s/p CABG, calcified mildly tortuous aorta.   Admission Wt: 120 lbs 9/22 Cards visit Wt: 120 lbs  Assessment and Plan: Victoria Banks is a 80 y.o. year old female presenting with acute resp failure with hypoxia, acute on chronic heart failure.   Active Problems:   Acute respiratory failure with hypoxia   Heart  failure   1- Acute resp failure with hypoxia  -Likely secondary to volume overload  -elevated pro BNP above baseline with vascular congestion on imaging -IV lasix  -strict Is and Os and daily weights  -will need 2D ECHO to assess ventricular and valvular function -cont supplemental O2  -cards consult as clinically indicated (pt states that she contacted Dr. Landis Gandy office about sxs today-per pt, they said they would see her-she is asking for this)  2- Aortic stenosis s/p AVR (pericardial tissue), CAD, HTN -no active CP currently  -tele bed  -cycle CEs  - BPs normalizing with diuresis currently  -hold BP meds in the interim as to avoid hypotension in setting of diuresis-titrate as needed   3- CKD  -stage 3-4 on presentation  -seems to be near baseline in review of labs  -follow in setting of diuresis -try to avoid nephrotoxic agents    FEN/GI: heart healthy, low sodium diet  Hyponatremia-hypervolemic. Diurese and reassess.  Prophylaxis: sub q heparin  Disposition: pending further evaluation  Code Status:Full Code    Patient Active Problem List   Diagnosis Date Noted  . Acute respiratory failure with hypoxia 07/24/2014  . Edema extremities 06/19/2014  . Coronary atherosclerosis of native coronary artery 11/15/2013  . Aortic valve disorders 11/15/2013  . Essential hypertension, benign 11/15/2013  . Pure hypercholesterolemia 11/15/2013  . Encounter for long-term (current) use of other medications 11/15/2013   Past Medical History: Past Medical History  Diagnosis Date  . Allergic rhinitis   . Hypertension   . Fibrocystic breast disease   . GERD (gastroesophageal reflux disease)   . Aortic stenosis, severe 08/2009    s/p AVR w  pericardial tissue valve 10/2009  . Osteoporosis 12/2010    Reclast given  . Multinodular goiter   . Coronary artery disease 10/2009    S/P SVG to OM   . CRD (chronic renal disease), stage III   . Positive for microalbuminuria     On tevetan   . Benign familial tremor   . Atelectasis     scarring at basis on CXR    Past Surgical History: History reviewed. No pertinent past surgical history.  Social History: History   Social History  . Marital Status: Widowed    Spouse Name: N/A    Number of Children: N/A  . Years of Education: N/A   Social History Main Topics  . Smoking status: Never Smoker   . Smokeless tobacco: None  . Alcohol Use: No  . Drug Use: No  . Sexual Activity: None   Other Topics Concern  . None   Social History Narrative  . None    Family History: History reviewed. No pertinent family history.  Allergies: Allergies  Allergen Reactions  . Aceon [Perindopril] Other (See Comments)    Chest Pain  . Actonel [Risedronate Sodium] Other (See Comments)    Unknown  . Allegra [Fexofenadine] Other (See Comments)    BACK PAIN  . Beta Adrenergic Blockers Other (See Comments)    Serious cough both times she tried.  . Biaxin [Clarithromycin] Nausea Only  . Ciprofloxacin Nausea Only  . Crestor [Rosuvastatin] Itching  . Fosamax [Alendronate Sodium] Other (See Comments)    Muscle pain  . Lipitor [Atorvastatin] Itching  . Promethazine Hcl Other (See Comments)    Unknown  . Simvastatin Rash  . Sulfa Antibiotics Other (See Comments)    GI Upset  . Welchol [Colesevelam Hcl] Rash    Current Facility-Administered Medications  Medication Dose Route Frequency Provider Last Rate Last Dose  . 0.9 %  sodium chloride infusion  250 mL Intravenous PRN Shanda Howells, MD      . aspirin tablet 81 mg  81 mg Oral Daily Shanda Howells, MD      . heparin injection 5,000 Units  5,000 Units Subcutaneous 3 times per day Shanda Howells, MD      . LORazepam (ATIVAN) tablet 0.5 mg  0.5 mg Oral QHS Shanda Howells, MD      . nitroGLYCERIN (NITROSTAT) SL tablet 0.4 mg  0.4 mg Sublingual Q5 min PRN Tanna Furry, MD      . potassium chloride SA (K-DUR,KLOR-CON) CR tablet 20 mEq  20 mEq Oral BID Shanda Howells, MD      .  pravastatin (PRAVACHOL) tablet 40 mg  40 mg Oral QPM Shanda Howells, MD      . sodium chloride 0.9 % injection 3 mL  3 mL Intravenous Q12H Shanda Howells, MD      . sodium chloride 0.9 % injection 3 mL  3 mL Intravenous Q12H Shanda Howells, MD      . sodium chloride 0.9 % injection 3 mL  3 mL Intravenous PRN Shanda Howells, MD       Current Outpatient Prescriptions  Medication Sig Dispense Refill  . aspirin 81 MG tablet Take 81 mg by mouth daily.      . calcium citrate-vitamin D (CALCIUM CITRATE +) 315-200 MG-UNIT per tablet Take 1 tablet by mouth 2 (two) times daily.      . cloNIDine (CATAPRES - DOSED IN MG/24 HR) 0.2 mg/24hr patch Place 1 patch (0.2 mg total) onto the skin once a week.  4  patch  12  . furosemide (LASIX) 80 MG tablet Take 80 mg by mouth daily.      . hydrALAZINE (APRESOLINE) 10 MG tablet Take 1 tablet (10 mg total) by mouth 3 (three) times daily.  90 tablet  3  . ipratropium (ATROVENT) 0.03 % nasal spray Place 2 sprays into both nostrils at bedtime as needed for rhinitis.       Marland Kitchen ketotifen (THERA TEARS ALLERGY) 0.025 % ophthalmic solution Place 1 drop into both eyes as needed (for dry eyes).      . LORazepam (ATIVAN) 0.5 MG tablet Take 0.5 mg by mouth at bedtime.       Marland Kitchen losartan (COZAAR) 100 MG tablet Take 1 tablet (100 mg total) by mouth daily.  30 tablet  3  . MELATONIN PO Take 1 tablet by mouth at bedtime.       . Multiple Vitamins-Minerals (CENTRUM SILVER ULTRA WOMENS PO) Take 1 tablet by mouth daily.      Marland Kitchen NIFEdipine (ADALAT CC) 90 MG 24 hr tablet Take 1 tablet (90 mg total) by mouth daily.  30 tablet  0  . potassium chloride SA (K-DUR,KLOR-CON) 20 MEQ tablet Take 20 mEq by mouth 2 (two) times daily.      . pravastatin (PRAVACHOL) 40 MG tablet Take 1 tablet (40 mg total) by mouth every evening.  30 tablet  6   Review Of Systems: 12 point ROS negative except as noted above in HPI.  Physical Exam: Filed Vitals:   07/24/14 1730  BP: 167/88  Pulse: 77  Temp:   Resp: 29     General: cooperative and mild distress HEENT: PERRLA and extra ocular movement intact Heart: S1, S2 normal, no murmur, rub or gallop, regular rate and rhythm Lungs: mildly labored breathing- no focal deficits auscultated  Abdomen: abdomen is soft without significant tenderness, masses, organomegaly or guarding Extremities: 2+ peripheral pulses, trace edema bilaterally  Skin:no rashes Neurology: normal without focal findings  Labs and Imaging: Lab Results  Component Value Date/Time   NA 132* 07/24/2014 12:19 PM   K 4.4 07/24/2014 12:19 PM   CL 94* 07/24/2014 12:19 PM   CO2 21 07/24/2014 12:19 PM   BUN 23 07/24/2014 12:19 PM   CREATININE 1.55* 07/24/2014 12:19 PM   GLUCOSE 123* 07/24/2014 12:19 PM   Lab Results  Component Value Date   WBC 8.1 07/24/2014   HGB 11.7* 07/24/2014   HCT 34.8* 07/24/2014   MCV 86.1 07/24/2014   PLT 221 07/24/2014    Dg Chest 2 View  07/24/2014   CLINICAL DATA:  80 year old female with shortness of breath. Initial encounter.  EXAM: CHEST  2 VIEW  COMPARISON:  08/12/2012.  FINDINGS: Small bilateral pleural effusions may represent changes of mild pulmonary edema superimposed upon chronic lung changes.  No segmental consolidation.  Cardiomegaly post CABG and valve replacement.  Calcified mildly tortuous aorta.  IMPRESSION: Small bilateral pleural effusions may represent changes of mild pulmonary edema superimposed upon chronic lung changes.  No segmental consolidation.  Cardiomegaly post CABG and valve replacement.  Calcified mildly tortuous aorta.   Electronically Signed   By: Chauncey Cruel M.D.   On: 07/24/2014 13:34           Shanda Howells MD  Pager: [phone]

## 2014-07-24 NOTE — ED Provider Notes (Signed)
CSN: [id number]     Arrival date & time 07/24/14  1158 History   First MD Initiated Contact with Patient 07/24/14 1219     Chief Complaint  Patient presents with  . Shortness of Breath      HPI  Patient presents with a complaint of shortness of breath. Increasing short of breath since Friday. She and family state that she has had high blood pressure readings at home for the better part of the last month. They have seen her cardiologist several times. He had difficulty with blood pressure control despite multiple medications. Was decreased from 80 twice a day of Lasix to 40 twice a day approximately one month ago. Symptoms progressing over the last 5 days. No fever. Dry cough. No chest pain. No extremity edema.   Past Medical History  Diagnosis Date  . Allergic rhinitis   . Hypertension   . Fibrocystic breast disease   . GERD (gastroesophageal reflux disease)   . Aortic stenosis, severe 08/2009    s/p AVR w pericardial tissue valve 10/2009  . Osteoporosis 12/2010    Reclast given  . Multinodular goiter   . Coronary artery disease 10/2009    S/P SVG to OM   . CRD (chronic renal disease), stage III   . Positive for microalbuminuria     On tevetan  . Benign familial tremor   . Atelectasis     scarring at basis on CXR   History reviewed. No pertinent past surgical history. History reviewed. No pertinent family history. History  Substance Use Topics  . Smoking status: Never Smoker   . Smokeless tobacco: Not on file  . Alcohol Use: No   OB History   Grav Para Term Preterm Abortions TAB SAB Ect Mult Living                 Review of Systems  Constitutional: Negative for fever, chills, diaphoresis, appetite change and fatigue.  HENT: Negative for mouth sores, sore throat and trouble swallowing.   Eyes: Negative for visual disturbance.  Respiratory: Positive for chest tightness and shortness of breath. Negative for cough and wheezing.   Cardiovascular: Negative for chest  pain.       PND and orthopnea. Decreased exertional capacity at home.  Gastrointestinal: Negative for nausea, vomiting, abdominal pain, diarrhea and abdominal distention.  Endocrine: Negative for polydipsia, polyphagia and polyuria.  Genitourinary: Negative for dysuria, frequency and hematuria.  Musculoskeletal: Negative for gait problem.  Skin: Negative for color change, pallor and rash.  Neurological: Negative for dizziness, syncope, light-headedness and headaches.  Hematological: Does not bruise/bleed easily.  Psychiatric/Behavioral: Negative for behavioral problems and confusion.      Allergies  Aceon; Actonel; Allegra; Beta adrenergic blockers; Biaxin; Ciprofloxacin; Crestor; Fosamax; Lipitor; Promethazine hcl; Simvastatin; Sulfa antibiotics; and Welchol  Home Medications   Prior to Admission medications   Medication Sig Start Date End Date Taking? Authorizing Provider  aspirin 81 MG tablet Take 81 mg by mouth daily.   Yes Historical Provider, MD  calcium citrate-vitamin D (CALCIUM CITRATE +) 315-200 MG-UNIT per tablet Take 1 tablet by mouth 2 (two) times daily.   Yes Historical Provider, MD  cloNIDine (CATAPRES - DOSED IN MG/24 HR) 0.2 mg/24hr patch Place 1 patch (0.2 mg total) onto the skin once a week. 06/19/14  Yes Sueanne Margarita, MD  furosemide (LASIX) 80 MG tablet Take 80 mg by mouth daily.   Yes Historical Provider, MD  hydrALAZINE (APRESOLINE) 10 MG tablet Take 1 tablet (10 mg  total) by mouth 3 (three) times daily. 06/26/14  Yes Sueanne Margarita, MD  ipratropium (ATROVENT) 0.03 % nasal spray Place 2 sprays into both nostrils at bedtime as needed for rhinitis.    Yes Historical Provider, MD  ketotifen (THERA TEARS ALLERGY) 0.025 % ophthalmic solution Place 1 drop into both eyes as needed (for dry eyes).   Yes Historical Provider, MD  LORazepam (ATIVAN) 0.5 MG tablet Take 0.5 mg by mouth at bedtime.    Yes Historical Provider, MD  losartan (COZAAR) 100 MG tablet Take 1 tablet (100  mg total) by mouth daily. 06/19/14  Yes Sueanne Margarita, MD  MELATONIN PO Take 1 tablet by mouth at bedtime.    Yes Historical Provider, MD  Multiple Vitamins-Minerals (CENTRUM SILVER ULTRA WOMENS PO) Take 1 tablet by mouth daily.   Yes Historical Provider, MD  NIFEdipine (ADALAT CC) 90 MG 24 hr tablet Take 1 tablet (90 mg total) by mouth daily. 01/03/14  Yes Sueanne Margarita, MD  potassium chloride SA (K-DUR,KLOR-CON) 20 MEQ tablet Take 20 mEq by mouth 2 (two) times daily.   Yes Historical Provider, MD  pravastatin (PRAVACHOL) 40 MG tablet Take 1 tablet (40 mg total) by mouth every evening. 07/09/14  Yes Sueanne Margarita, MD   BP 167/88  Pulse 77  Temp(Src) 98 F (36.7 C)  Resp 29  Ht 5\' 1"  (1.549 m)  Wt 120 lb (54.432 kg)  BMI 22.69 kg/m2  SpO2 96% Physical Exam  Constitutional: She is oriented to person, place, and time. She appears well-developed and well-nourished. No distress.  HENT:  Head: Normocephalic.  Eyes: Conjunctivae are normal. Pupils are equal, round, and reactive to light. No scleral icterus.  Neck: Normal range of motion. Neck supple. No thyromegaly present.  Cardiovascular: Normal rate and regular rhythm.  Exam reveals no gallop and no friction rub.   No murmur heard. S3 and 4 gallop. Crackles at bilateral bases  Pulmonary/Chest: Effort normal and breath sounds normal. No respiratory distress. She has no wheezes. She has no rales.  Abdominal: Soft. Bowel sounds are normal. She exhibits no distension. There is no tenderness. There is no rebound.  Musculoskeletal: Normal range of motion.  Neurological: She is alert and oriented to person, place, and time.  Skin: Skin is warm and dry. No rash noted.  Psychiatric: She has a normal mood and affect. Her behavior is normal.    ED Course  Procedures (including critical care time) Labs Review Labs Reviewed  CBC WITH DIFFERENTIAL - Abnormal; Notable for the following:    Hemoglobin 11.7 (*)    HCT 34.8 (*)    Neutrophils  Relative % 78 (*)    All other components within normal limits  COMPREHENSIVE METABOLIC PANEL - Abnormal; Notable for the following:    Sodium 132 (*)    Chloride 94 (*)    Glucose, Bld 123 (*)    Creatinine, Ser 1.55 (*)    GFR calc non Af Amer 28 (*)    GFR calc Af Amer 33 (*)    Anion gap 17 (*)    All other components within normal limits  PRO B NATRIURETIC PEPTIDE - Abnormal; Notable for the following:    Pro B Natriuretic peptide (BNP) 3852.0 (*)    All other components within normal limits  Randolm Idol, ED    Imaging Review Dg Chest 2 View  07/24/2014   CLINICAL DATA:  80 year old female with shortness of breath. Initial encounter.  EXAM: CHEST  2  VIEW  COMPARISON:  08/12/2012.  FINDINGS: Small bilateral pleural effusions may represent changes of mild pulmonary edema superimposed upon chronic lung changes.  No segmental consolidation.  Cardiomegaly post CABG and valve replacement.  Calcified mildly tortuous aorta.  IMPRESSION: Small bilateral pleural effusions may represent changes of mild pulmonary edema superimposed upon chronic lung changes.  No segmental consolidation.  Cardiomegaly post CABG and valve replacement.  Calcified mildly tortuous aorta.   Electronically Signed   By: Chauncey Cruel M.D.   On: 07/24/2014 13:34     EKG Interpretation None      MDM   Final diagnoses:  SOB (shortness of breath)   Patient given nitroglycerin sublingual, given 40 of IV Lasix. Has started to diurese.  15:45:   I discussed the case with cardiology. They will see the patient in the emergency department. She remains  stable. On 2 L nasal cannula O2 is 94%.    Tanna Furry, MD 07/24/14 [phone]

## 2014-07-24 NOTE — Telephone Encounter (Signed)
Per EPIC records, she is currently at the ER as of 07/24/14 at 1212.

## 2014-07-25 DIAGNOSIS — I129 Hypertensive chronic kidney disease with stage 1 through stage 4 chronic kidney disease, or unspecified chronic kidney disease: Secondary | ICD-10-CM | POA: Diagnosis present

## 2014-07-25 DIAGNOSIS — N184 Chronic kidney disease, stage 4 (severe): Secondary | ICD-10-CM | POA: Diagnosis present

## 2014-07-25 DIAGNOSIS — J9601 Acute respiratory failure with hypoxia: Secondary | ICD-10-CM | POA: Diagnosis present

## 2014-07-25 DIAGNOSIS — R0602 Shortness of breath: Secondary | ICD-10-CM

## 2014-07-25 DIAGNOSIS — Z79899 Other long term (current) drug therapy: Secondary | ICD-10-CM | POA: Diagnosis not present

## 2014-07-25 DIAGNOSIS — I5031 Acute diastolic (congestive) heart failure: Secondary | ICD-10-CM | POA: Diagnosis present

## 2014-07-25 DIAGNOSIS — R202 Paresthesia of skin: Secondary | ICD-10-CM | POA: Diagnosis not present

## 2014-07-25 DIAGNOSIS — Z951 Presence of aortocoronary bypass graft: Secondary | ICD-10-CM | POA: Diagnosis not present

## 2014-07-25 DIAGNOSIS — Z952 Presence of prosthetic heart valve: Secondary | ICD-10-CM | POA: Diagnosis not present

## 2014-07-25 DIAGNOSIS — Z882 Allergy status to sulfonamides status: Secondary | ICD-10-CM | POA: Diagnosis not present

## 2014-07-25 DIAGNOSIS — Z888 Allergy status to other drugs, medicaments and biological substances status: Secondary | ICD-10-CM | POA: Diagnosis not present

## 2014-07-25 DIAGNOSIS — I059 Rheumatic mitral valve disease, unspecified: Secondary | ICD-10-CM | POA: Diagnosis not present

## 2014-07-25 DIAGNOSIS — K219 Gastro-esophageal reflux disease without esophagitis: Secondary | ICD-10-CM | POA: Diagnosis present

## 2014-07-25 DIAGNOSIS — M810 Age-related osteoporosis without current pathological fracture: Secondary | ICD-10-CM | POA: Diagnosis present

## 2014-07-25 DIAGNOSIS — T501X5A Adverse effect of loop [high-ceiling] diuretics, initial encounter: Secondary | ICD-10-CM | POA: Diagnosis present

## 2014-07-25 DIAGNOSIS — E780 Pure hypercholesterolemia: Secondary | ICD-10-CM | POA: Diagnosis present

## 2014-07-25 DIAGNOSIS — I2510 Atherosclerotic heart disease of native coronary artery without angina pectoris: Secondary | ICD-10-CM | POA: Diagnosis present

## 2014-07-25 DIAGNOSIS — Z7982 Long term (current) use of aspirin: Secondary | ICD-10-CM | POA: Diagnosis not present

## 2014-07-25 DIAGNOSIS — I6782 Cerebral ischemia: Secondary | ICD-10-CM | POA: Diagnosis not present

## 2014-07-25 DIAGNOSIS — I350 Nonrheumatic aortic (valve) stenosis: Secondary | ICD-10-CM | POA: Diagnosis present

## 2014-07-25 LAB — CBC WITH DIFFERENTIAL/PLATELET
BASOS ABS: 0 10*3/uL (ref 0.0–0.1)
BASOS PCT: 1 % (ref 0–1)
EOS ABS: 0.3 10*3/uL (ref 0.0–0.7)
EOS PCT: 4 % (ref 0–5)
HCT: 34.3 % — ABNORMAL LOW (ref 36.0–46.0)
Hemoglobin: 11.6 g/dL — ABNORMAL LOW (ref 12.0–15.0)
Lymphocytes Relative: 26 % (ref 12–46)
Lymphs Abs: 1.7 10*3/uL (ref 0.7–4.0)
MCH: 28.6 pg (ref 26.0–34.0)
MCHC: 33.8 g/dL (ref 30.0–36.0)
MCV: 84.5 fL (ref 78.0–100.0)
Monocytes Absolute: 0.7 10*3/uL (ref 0.1–1.0)
Monocytes Relative: 11 % (ref 3–12)
Neutro Abs: 3.7 10*3/uL (ref 1.7–7.7)
Neutrophils Relative %: 58 % (ref 43–77)
PLATELETS: 219 10*3/uL (ref 150–400)
RBC: 4.06 MIL/uL (ref 3.87–5.11)
RDW: 13.4 % (ref 11.5–15.5)
WBC: 6.4 10*3/uL (ref 4.0–10.5)

## 2014-07-25 LAB — COMPREHENSIVE METABOLIC PANEL
ALT: 22 U/L (ref 0–35)
AST: 18 U/L (ref 0–37)
Albumin: 3.2 g/dL — ABNORMAL LOW (ref 3.5–5.2)
Alkaline Phosphatase: 54 U/L (ref 39–117)
Anion gap: 17 — ABNORMAL HIGH (ref 5–15)
BUN: 22 mg/dL (ref 6–23)
CO2: 22 meq/L (ref 19–32)
CREATININE: 1.56 mg/dL — AB (ref 0.50–1.10)
Calcium: 9.1 mg/dL (ref 8.4–10.5)
Chloride: 100 mEq/L (ref 96–112)
GFR calc Af Amer: 32 mL/min — ABNORMAL LOW (ref >90)
GFR calc non Af Amer: 28 mL/min — ABNORMAL LOW (ref >90)
GLUCOSE: 111 mg/dL — AB (ref 70–99)
Potassium: 4.2 mEq/L (ref 3.7–5.3)
SODIUM: 139 meq/L (ref 137–147)
Total Bilirubin: 0.3 mg/dL (ref 0.3–1.2)
Total Protein: 7 g/dL (ref 6.0–8.3)

## 2014-07-25 LAB — TROPONIN I: Troponin I: <0.3 ng/mL (ref <0.30)

## 2014-07-25 LAB — MRSA PCR SCREENING: MRSA by PCR: NEGATIVE

## 2014-07-25 MED ORDER — HYDRALAZINE HCL 20 MG/ML IJ SOLN
5.0000 mg | Freq: Once | INTRAMUSCULAR | Status: AC
  Administered 2014-07-25: 5 mg via INTRAVENOUS
  Filled 2014-07-25: qty 1

## 2014-07-25 MED ORDER — FUROSEMIDE 10 MG/ML IJ SOLN
40.0000 mg | Freq: Two times a day (BID) | INTRAMUSCULAR | Status: DC
  Administered 2014-07-25 – 2014-07-27 (×5): 40 mg via INTRAVENOUS
  Filled 2014-07-25 (×9): qty 4

## 2014-07-25 NOTE — Progress Notes (Signed)
[facility] PROGRESS NOTE  Victoria Banks:[id number] DOB: [birth year] DOA: 07/24/2014 PCP: Mathews Argyle, MD  Assessment/Plan: Active Problems:   Acute respiratory failure with hypoxia - resolving on current diuretic regimen. - Most likely 2ary to acute heart failure. Echocardiogram pending - supplemental oxygen as needed    Heart failure - Continue lasix 40 mg IV bid - improving - Etiology pending echocardiogram results.    CKD (chronic kidney disease) stage 4, GFR 15-29 ml/min - S creatinine stable at 1.5 - Producing good diuresis  Essential HTN - Currently on Hydralazine, BP values have not been all that well controlled. - will continue to monitor and consider adjusting antihypertensive regimen pending BP values.  Code Status: full Family Communication: none at bedside Disposition Plan: Pending continued improvement in condition.   Consultants:  None  Procedures:  Echocardiogram currently pending  Antibiotics:  None  HPI/Subjective: Pt has no new complaints. No acute issues reported overnight. Reports feeling better today  Objective: Filed Vitals:   07/25/14 1024  BP: 133/46  Pulse:   Temp:   Resp:     Intake/Output Summary (Last 24 hours) at 07/25/14 1046 Last data filed at 07/25/14 0934  Gross per 24 hour  Intake    240 ml  Output   2950 ml  Net  -2710 ml   Filed Weights   07/24/14 1207 07/24/14 2000 07/25/14 0537  Weight: 54.432 kg (120 lb) 54.522 kg (120 lb 3.2 oz) 53.479 kg (117 lb 14.4 oz)    Exam:   General:  Pt in nad, alert and awake  Cardiovascular: rrr, no mrg  Respiratory: Rhales Bl at bases, no wheezes  Abdomen: soft, NT, ND  Musculoskeletal: no cyanosis or clubbing   Data Reviewed: Basic Metabolic Panel:  Recent Labs Lab 07/24/14 1219 07/24/14 2134 07/25/14 0123  NA 132*  --  139  K 4.4  --  4.2  CL 94*  --  100  CO2 21  --  22  GLUCOSE 123*  --  111*  BUN 23  --  22  CREATININE 1.55* 1.65*  1.56*  CALCIUM 9.4  --  9.1   Liver Function Tests:  Recent Labs Lab 07/24/14 1219 07/25/14 0123  AST 25 18  ALT 26 22  ALKPHOS 54 54  BILITOT 0.4 0.3  PROT 7.3 7.0  ALBUMIN 3.5 3.2*   No results found for this basename: LIPASE, AMYLASE,  in the last 168 hours No results found for this basename: AMMONIA,  in the last 168 hours CBC:  Recent Labs Lab 07/24/14 1219 07/24/14 2134 07/25/14 0123  WBC 8.1 6.8 6.4  NEUTROABS 6.3  --  3.7  HGB 11.7* 11.9* 11.6*  HCT 34.8* 35.7* 34.3*  MCV 86.1 84.4 84.5  PLT 221 251 219   Cardiac Enzymes:  Recent Labs Lab 07/24/14 2134 07/25/14 0133 07/25/14 0823  TROPONINI <0.30 <0.30 <0.30   BNP (last 3 results)  Recent Labs  07/24/14 1307  PROBNP 3852.0*   CBG: No results found for this basename: GLUCAP,  in the last 168 hours  Recent Results (from the past 240 hour(s))  MRSA PCR SCREENING     Status: None   Collection Time    07/24/14 11:29 PM      Result Value Ref Range Status   MRSA by PCR NEGATIVE  NEGATIVE Final   Comment:            The GeneXpert MRSA Assay (FDA     approved for NASAL specimens  only), is one component of a     comprehensive MRSA colonization     surveillance program. It is not     intended to diagnose MRSA     infection nor to guide or     monitor treatment for     MRSA infections.     Studies: Dg Chest 2 View  07/24/2014   CLINICAL DATA:  80 year old female with shortness of breath. Initial encounter.  EXAM: CHEST  2 VIEW  COMPARISON:  08/12/2012.  FINDINGS: Small bilateral pleural effusions may represent changes of mild pulmonary edema superimposed upon chronic lung changes.  No segmental consolidation.  Cardiomegaly post CABG and valve replacement.  Calcified mildly tortuous aorta.  IMPRESSION: Small bilateral pleural effusions may represent changes of mild pulmonary edema superimposed upon chronic lung changes.  No segmental consolidation.  Cardiomegaly post CABG and valve replacement.   Calcified mildly tortuous aorta.   Electronically Signed   By: Chauncey Cruel M.D.   On: 07/24/2014 13:34    Scheduled Meds: . aspirin EC  81 mg Oral Daily  . furosemide  40 mg Intravenous BID  . heparin  5,000 Units Subcutaneous 3 times per day  . hydrALAZINE  10 mg Oral TID  . LORazepam  0.5 mg Oral QHS  . potassium chloride SA  20 mEq Oral BID  . pravastatin  40 mg Oral QPM  . sodium chloride  3 mL Intravenous Q12H  . sodium chloride  3 mL Intravenous Q12H   Continuous Infusions:    Time spent: > 35 minutes    Velvet Bathe  [facility] Pager [phone] If 7PM-7AM, please contact night-coverage at [email or web address], password [facility] 07/25/2014, 10:46 AM  LOS: 1 day

## 2014-07-25 NOTE — Evaluation (Signed)
Occupational Therapy Evaluation Patient Details Name: Victoria Banks MRN: [id number] DOB: 1921-03-10 Today's Date: 07/25/2014    History of Present Illness Pt adm with acute resp failure and CHF. PMH - CABG   Clinical Impression   Pt was living independently prior to admission.  She reports feeling much better since admission and is breathing easier.  Pt is still 02 dependent and demonstrates mild balance deficits and generalized weakness interfering with ADL and ADL transfer.  Will likely progress well.  Will follow to educate in safety, energy conservation and ADL.     Follow Up Recommendations  Home health OT;Supervision - Intermittent    Equipment Recommendations       Recommendations for Other Services       Precautions / Restrictions Precautions Precautions: Fall Precaution Comments: check sats off 02      Mobility Bed Mobility Overal bed mobility: Modified Independent                Transfers Overall transfer level: Needs assistance Equipment used: 1 person hand held assist Transfers: Sit to/from Stand Sit to Stand: Min assist         General transfer comment: assist for balance    Balance Overall balance assessment: Needs assistance Sitting-balance support: No upper extremity supported Sitting balance-Leahy Scale: Good     Standing balance support: No upper extremity supported Standing balance-Leahy Scale: Fair                              ADL Overall ADL's : Needs assistance/impaired Eating/Feeding: Independent;Sitting   Grooming: Supervision/safety;Standing;Wash/dry hands   Upper Body Bathing: Set up;Sitting   Lower Body Bathing: Minimal assistance;Sit to/from stand   Upper Body Dressing : Set up;Sitting   Lower Body Dressing: Minimal assistance;Sit to/from stand   Toilet Transfer: Minimal Teacher, English as a foreign language;Ambulation   Toileting- Clothing Manipulation and Hygiene: Minimal assistance;Sit to/from stand        Functional mobility during ADLs: Minimal assistance (hand held) General ADL Comments: Pt fatigues somewhat easily.     Vision                     Perception     Praxis      Pertinent Vitals/Pain Pain Assessment: No/denies pain     Hand Dominance Right   Extremity/Trunk Assessment Upper Extremity Assessment Upper Extremity Assessment: Overall WFL for tasks assessed   Lower Extremity Assessment Lower Extremity Assessment: Defer to PT evaluation       Communication Communication Communication: No difficulties   Cognition Arousal/Alertness: Awake/alert Behavior During Therapy: WFL for tasks assessed/performed Overall Cognitive Status: Within Functional Limits for tasks assessed                     General Comments       Exercises       Shoulder Instructions      Home Living Family/patient expects to be discharged to:: Private residence [facility]) Living Arrangements: Alone Available Help at Discharge: Family;Friend(s);Available PRN/intermittently Type of Home: Independent living facility Home Access: Level entry     Home Layout: One level     Bathroom Shower/Tub: Occupational psychologist: Handicapped height     Home Equipment: Shower seat - built in;Grab bars - toilet;Grab bars - tub/shower   Additional Comments: Pt drives.  Has one meal per day in the dining room.      Prior Functioning/Environment Level of Independence:  Independent        Comments: Still drives    OT Diagnosis: Generalized weakness   OT Problem List: Impaired balance (sitting and/or standing);Decreased activity tolerance;Decreased knowledge of use of DME or AE;Cardiopulmonary status limiting activity   OT Treatment/Interventions: Self-care/ADL training;Balance training;Patient/family education;Energy conservation;DME and/or AE instruction;Therapeutic activities    OT Goals(Current goals can be found in the care plan section) Acute Rehab OT  Goals Patient Stated Goal: Return home OT Goal Formulation: With patient Time For Goal Achievement: 08/01/14 Potential to Achieve Goals: Good ADL Goals Pt Will Perform Grooming: with modified independence;standing Pt Will Perform Lower Body Bathing: with modified independence;sit to/from stand Pt Will Perform Lower Body Dressing: with modified independence;sit to/from stand Pt Will Transfer to Toilet: with modified independence;ambulating;bedside commode Pt Will Perform Toileting - Clothing Manipulation and hygiene: with modified independence;sit to/from stand Pt Will Perform Tub/Shower Transfer: with modified independence;ambulating Additional ADL Goal #1: Pt will utilize energy conservation strategies during ADL and mobility independently.  OT Frequency: Min 2X/week   Barriers to D/C:            Co-evaluation              End of Session Equipment Utilized During Treatment: Gait belt  Activity Tolerance: Patient tolerated treatment well Patient left: in bed;with call bell/phone within reach;with family/visitor present   Time: 1511-1540 OT Time Calculation (min): 29 min Charges:  OT General Charges $OT Visit: 1 Procedure OT Evaluation $Initial OT Evaluation Tier I: 1 Procedure OT Treatments $Self Care/Home Management : 8-22 mins G-Codes:    Victoria Banks 07/25/2014, 3:49 PM [phone]

## 2014-07-25 NOTE — Progress Notes (Signed)
UR completed Ranya Fiddler K. Kiala Faraj, RN, BSN, MSHL, CCM  07/25/2014 1:34 PM

## 2014-07-25 NOTE — Progress Notes (Signed)
  Echocardiogram 2D Echocardiogram has been performed.  Victoria Banks M 07/25/2014, 12:07 PM

## 2014-07-25 NOTE — Evaluation (Signed)
Physical Therapy Evaluation Patient Details Name: Victoria Banks MRN: [id number] DOB: Dec 30, 1920 Today's Date: 07/25/2014   History of Present Illness  Pt adm with acute resp failure and CHF. PMH - CABG  Clinical Impression  Pt admitted with above. Pt currently with functional limitations due to the deficits listed below (see PT Problem List).  Pt will benefit from skilled PT to increase their independence and safety with mobility to allow discharge to the venue listed below.       Follow Up Recommendations Home health PT;Supervision - Intermittent    Equipment Recommendations  Other (comment) (to be determined)    Recommendations for Other Services       Precautions / Restrictions Precautions Precautions: Fall      Mobility  Bed Mobility                  Transfers Overall transfer level: Needs assistance Equipment used: 1 person hand held assist Transfers: Sit to/from Stand Sit to Stand: Min assist         General transfer comment: assist for balance  Ambulation/Gait Ambulation/Gait assistance: Min assist Ambulation Distance (Feet): 125 Feet Assistive device: 1 person hand held assist Gait Pattern/deviations: Step-through pattern;Decreased step length - right;Decreased step length - left;Narrow base of support     General Gait Details: Assist for balance.  Stairs            Wheelchair Mobility    Modified Rankin (Stroke Patients Only)       Balance Overall balance assessment: Needs assistance Sitting-balance support: No upper extremity supported Sitting balance-Leahy Scale: Good     Standing balance support: No upper extremity supported Standing balance-Leahy Scale: Fair                               Pertinent Vitals/Pain Pain Assessment: No/denies pain    Home Living Family/patient expects to be discharged to:: Private residence ([place] independent living) Living Arrangements: Alone   Type of Home:  Independent living facility Home Access: Level entry     Home Layout: One level Home Equipment: None      Prior Function Level of Independence: Independent         Comments: Still drives     Hand Dominance        Extremity/Trunk Assessment   Upper Extremity Assessment: Defer to OT evaluation           Lower Extremity Assessment: Generalized weakness         Communication   Communication: No difficulties  Cognition Arousal/Alertness: Awake/alert Behavior During Therapy: WFL for tasks assessed/performed Overall Cognitive Status: Within Functional Limits for tasks assessed                      General Comments      Exercises        Assessment/Plan    PT Assessment Patient needs continued PT services  PT Diagnosis Difficulty walking;Generalized weakness   PT Problem List Decreased strength;Decreased activity tolerance;Decreased balance;Decreased mobility  PT Treatment Interventions DME instruction;Gait training;Balance training;Functional mobility training;Therapeutic activities;Therapeutic exercise;Patient/family education   PT Goals (Current goals can be found in the Care Plan section) Acute Rehab PT Goals Patient Stated Goal: Return home PT Goal Formulation: With patient Time For Goal Achievement: 08/01/14 Potential to Achieve Goals: Good    Frequency Min 3X/week   Barriers to discharge        Co-evaluation  End of Session Equipment Utilized During Treatment: Oxygen Activity Tolerance: Patient tolerated treatment well Patient left: in chair;with call bell/phone within reach;with family/visitor present Nurse Communication: Mobility status         Time: 1212-1228 PT Time Calculation (min): 16 min   Charges:   PT Evaluation $Initial PT Evaluation Tier I: 1 Procedure PT Treatments $Gait Training: 8-22 mins   PT G Codes:          Tyreona Panjwani Aug 06, 2014, 12:54 PM  [employer] PT [phone]

## 2014-07-25 NOTE — Progress Notes (Addendum)
Patient blood pressure has consistently been elevated.  Always above 308 systolic.  This AM it is 213, recheck at 657 systolic.  Patient states she did not take her noon dose of hydralazine 10mg  yesterday due to being in the ED.  I gave her 2200 dose.  Paged Baltazar Najjar, MD about high blood pressure.  Awaiting orders. Will continue to monitor.   MD ordered one time dose of hydralazine IV 5mg . Will administer and recheck b/p.

## 2014-07-25 NOTE — Progress Notes (Signed)
Patient is a resident of [facility]. CSW spoke to [facility] at [facility]. She stated that patient is currently a resident of their [place] and was extremely independent prior to hospitalization; she was still driving etc.  She is hopeful that patient can return to her apartment at the facility but will accept at ALF level if indicated per PT/MD recommendation.  Lorie Phenix. Ebonique Good, [place]

## 2014-07-25 NOTE — Progress Notes (Signed)
UR completed 

## 2014-07-26 ENCOUNTER — Encounter (HOSPITAL_COMMUNITY): Payer: Self-pay | Admitting: Radiology

## 2014-07-26 ENCOUNTER — Inpatient Hospital Stay (HOSPITAL_COMMUNITY): Payer: Medicare Other

## 2014-07-26 LAB — CBC WITH DIFFERENTIAL/PLATELET
Basophils Absolute: 0 10*3/uL (ref 0.0–0.1)
Basophils Relative: 1 % (ref 0–1)
EOS ABS: 0.5 10*3/uL (ref 0.0–0.7)
Eosinophils Relative: 8 % — ABNORMAL HIGH (ref 0–5)
HCT: 34.1 % — ABNORMAL LOW (ref 36.0–46.0)
Hemoglobin: 11 g/dL — ABNORMAL LOW (ref 12.0–15.0)
LYMPHS PCT: 32 % (ref 12–46)
Lymphs Abs: 2.1 10*3/uL (ref 0.7–4.0)
MCH: 28.3 pg (ref 26.0–34.0)
MCHC: 32.3 g/dL (ref 30.0–36.0)
MCV: 87.7 fL (ref 78.0–100.0)
Monocytes Absolute: 0.6 10*3/uL (ref 0.1–1.0)
Monocytes Relative: 9 % (ref 3–12)
NEUTROS ABS: 3.3 10*3/uL (ref 1.7–7.7)
Neutrophils Relative %: 50 % (ref 43–77)
PLATELETS: 228 10*3/uL (ref 150–400)
RBC: 3.89 MIL/uL (ref 3.87–5.11)
RDW: 13.7 % (ref 11.5–15.5)
WBC: 6.5 10*3/uL (ref 4.0–10.5)

## 2014-07-26 LAB — COMPREHENSIVE METABOLIC PANEL
ALT: 16 U/L (ref 0–35)
ANION GAP: 13 (ref 5–15)
AST: 13 U/L (ref 0–37)
Albumin: 2.9 g/dL — ABNORMAL LOW (ref 3.5–5.2)
Alkaline Phosphatase: 46 U/L (ref 39–117)
BUN: 32 mg/dL — ABNORMAL HIGH (ref 6–23)
CALCIUM: 8.5 mg/dL (ref 8.4–10.5)
CO2: 25 mEq/L (ref 19–32)
CREATININE: 1.89 mg/dL — AB (ref 0.50–1.10)
Chloride: 102 mEq/L (ref 96–112)
GFR, EST AFRICAN AMERICAN: 26 mL/min — AB (ref >90)
GFR, EST NON AFRICAN AMERICAN: 22 mL/min — AB (ref >90)
GLUCOSE: 105 mg/dL — AB (ref 70–99)
Potassium: 4.3 mEq/L (ref 3.7–5.3)
Sodium: 140 mEq/L (ref 137–147)
TOTAL PROTEIN: 6.4 g/dL (ref 6.0–8.3)
Total Bilirubin: 0.2 mg/dL — ABNORMAL LOW (ref 0.3–1.2)

## 2014-07-26 MED ORDER — DIPHENHYDRAMINE-ZINC ACETATE 2-0.1 % EX CREA
TOPICAL_CREAM | Freq: Three times a day (TID) | CUTANEOUS | Status: DC | PRN
  Administered 2014-07-26: 22:00:00 via TOPICAL
  Filled 2014-07-26: qty 28

## 2014-07-26 MED ORDER — LOSARTAN POTASSIUM 50 MG PO TABS
100.0000 mg | ORAL_TABLET | Freq: Every day | ORAL | Status: DC
  Administered 2014-07-26 – 2014-07-27 (×2): 100 mg via ORAL
  Filled 2014-07-26 (×2): qty 2

## 2014-07-26 MED ORDER — HYDRALAZINE HCL 20 MG/ML IJ SOLN
10.0000 mg | Freq: Four times a day (QID) | INTRAMUSCULAR | Status: DC | PRN
  Administered 2014-07-26: 10 mg via INTRAVENOUS
  Filled 2014-07-26: qty 1

## 2014-07-26 MED ORDER — DIPHENHYDRAMINE HCL 12.5 MG/5ML PO ELIX
6.2500 mg | ORAL_SOLUTION | Freq: Once | ORAL | Status: AC
  Administered 2014-07-26: 6.25 mg via ORAL
  Filled 2014-07-26: qty 5

## 2014-07-26 MED ORDER — SODIUM CHLORIDE 0.9 % IV SOLN
INTRAVENOUS | Status: DC
  Administered 2014-07-26: 1000 mL via INTRAVENOUS

## 2014-07-26 MED ORDER — NIFEDIPINE ER OSMOTIC RELEASE 90 MG PO TB24
90.0000 mg | ORAL_TABLET | Freq: Every day | ORAL | Status: DC
  Administered 2014-07-26 – 2014-07-27 (×2): 90 mg via ORAL
  Filled 2014-07-26 (×2): qty 1

## 2014-07-26 NOTE — Progress Notes (Signed)
Pt's BP 209/78 manually before  administration of cozaar 100mg .po.     Dr. Wendee Beavers informed and instructed to recheck pt's  BP at 1400 and call and let him know the result.  Will continue to monitor.  Karie Kirks, Therapist, sports.

## 2014-07-26 NOTE — Progress Notes (Signed)
Physical Therapy Treatment Patient Details Name: NALAYSIA MANGANIELLO MRN: [id number] DOB: 09-May-1921 Today's Date: 08-15-2014    History of Present Illness Pt adm with acute resp failure and CHF. PMH - CABG    PT Comments    Pt making steady progress.  Follow Up Recommendations  Home health PT;Supervision - Intermittent     Equipment Recommendations  Other (comment) (to be determined)    Recommendations for Other Services       Precautions / Restrictions Precautions Precautions: Fall    Mobility  Bed Mobility                  Transfers Overall transfer level: Needs assistance Equipment used: None Transfers: Sit to/from Stand;Stand Pivot Transfers Sit to Stand: Supervision Stand pivot transfers: Supervision          Ambulation/Gait Ambulation/Gait assistance: Min guard;Min assist Ambulation Distance (Feet): 200 Feet Assistive device: None Gait Pattern/deviations: Staggering left;Step-through pattern;Decreased stride length     General Gait Details: Occasional stagger requiring min A to correct. Pt does not want to use a walker if possible.   Stairs            Wheelchair Mobility    Modified Rankin (Stroke Patients Only)       Balance     Sitting balance-Leahy Scale: Good       Standing balance-Leahy Scale: Fair                      Cognition Arousal/Alertness: Awake/alert Behavior During Therapy: WFL for tasks assessed/performed Overall Cognitive Status: Within Functional Limits for tasks assessed                      Exercises      General Comments        Pertinent Vitals/Pain Pain Assessment: No/denies pain    Home Living                      Prior Function            PT Goals (current goals can now be found in the care plan section) Progress towards PT goals: Progressing toward goals    Frequency  Min 3X/week    PT Plan Current plan remains appropriate    Co-evaluation              End of Session   Activity Tolerance: Patient tolerated treatment well Patient left: in chair;with call bell/phone within reach;with family/visitor present     Time: 0856-0909 PT Time Calculation (min): 13 min  Charges:  $Gait Training: 8-22 mins                    G Codes:      Jack Mineau Aug 15, 2014, 9:16 AM  [employer] PT [phone]

## 2014-07-26 NOTE — Discharge Summary (Addendum)
Physician Discharge Summary  Victoria Banks CHE:[id number] DOB: 1921-09-25 DOA: 07/24/2014  PCP: Victoria Argyle, MD  Admit date: 07/24/2014 Discharge date: 07/27/2014  Time spent: > 35 minutes  Recommendations for Outpatient Follow-up:  1. D/c back to independent living with OT/PT/nursing services  Discharge Diagnoses:  Active Problems:   Acute respiratory failure with hypoxia   Heart failure   CKD (chronic kidney disease) stage 4, GFR 15-29 ml/min   Discharge Condition: stable  Diet recommendation: heart healthy/low sodium  Filed Weights   07/25/14 0537 07/26/14 0527 07/27/14 0449  Weight: 53.479 kg (117 lb 14.4 oz) 53.2 kg (117 lb 4.6 oz) 53.9 kg (118 lb 13.3 oz)    History of present illness:  80 y/o presenting with acute respiratory failure, hypoxia, and SOB. Was diagnosed with chf exacerbation  Hospital Course:  Acute diastolic HF - Echocardiogram with normal EF but grade 2 DD - Improved on lasix 40 mg IV BID. Pt reports feeling back at baseline - Heart failure Home health orders placed on discharge - 1-2 wk f/u with pcp or cardiologist after d/c  Hypertension - Patient was placed back on her home medications and subsequently had improved blood pressure control.  Elevated serum creatinine - Most likely due to Lasix administration. Most likely prerenal given BUN to creatinine ratio of 2 to 1. We'll recommend holding Lasix for today and tomorrow and start on November 1. Patient is aware  Procedures:  Echocardiogram  Consultations:  none  Discharge Exam: Filed Vitals:   07/27/14 1348  BP: 155/62  Pulse: 80  Temp: 98.8 F (37.1 C)  Resp: 16    General: pt in nad, alert and awake Cardiovascular: rrr, no mrg Respiratory: cta bl, no wheezes, speaking in full sentences on room air.  Discharge Instructions You were cared for by a hospitalist during your hospital stay. If you have any questions about your discharge medications or the care you  received while you were in the hospital after you are discharged, you can call the unit and asked to speak with the hospitalist on call if the hospitalist that took care of you is not available. Once you are discharged, your primary care physician will handle any further medical issues. Please note that NO REFILLS for any discharge medications will be authorized once you are discharged, as it is imperative that you return to your primary care physician (or establish a relationship with a primary care physician if you do not have one) for your aftercare needs so that they can reassess your need for medications and monitor your lab values.  Discharge Instructions   Call MD for:  difficulty breathing, headache or visual disturbances    Complete by:  As directed      Call MD for:  temperature >100.4    Complete by:  As directed      Diet - low sodium heart healthy    Complete by:  As directed      Discharge instructions    Complete by:  As directed   Patient to f/u with cardiologist or pcp within the next 1 to 2 weeks after discharge.     Increase activity slowly    Complete by:  As directed           Current Discharge Medication List    CONTINUE these medications which have CHANGED   Details  furosemide (LASIX) 80 MG tablet Take 1 tablet (80 mg total) by mouth daily.      CONTINUE these medications which  have NOT CHANGED   Details  aspirin 81 MG tablet Take 81 mg by mouth daily.    calcium citrate-vitamin D (CALCIUM CITRATE +) 315-200 MG-UNIT per tablet Take 1 tablet by mouth 2 (two) times daily.    hydrALAZINE (APRESOLINE) 10 MG tablet Take 1 tablet (10 mg total) by mouth 3 (three) times daily. Qty: 90 tablet, Refills: 3    ipratropium (ATROVENT) 0.03 % nasal spray Place 2 sprays into both nostrils at bedtime as needed for rhinitis.     ketotifen (THERA TEARS ALLERGY) 0.025 % ophthalmic solution Place 1 drop into both eyes as needed (for dry eyes).    LORazepam (ATIVAN) 0.5 MG  tablet Take 0.5 mg by mouth at bedtime.     losartan (COZAAR) 100 MG tablet Take 1 tablet (100 mg total) by mouth daily. Qty: 30 tablet, Refills: 3    MELATONIN PO Take 1 tablet by mouth at bedtime.     Multiple Vitamins-Minerals (CENTRUM SILVER ULTRA WOMENS PO) Take 1 tablet by mouth daily.    NIFEdipine (ADALAT CC) 90 MG 24 hr tablet Take 1 tablet (90 mg total) by mouth daily. Qty: 30 tablet, Refills: 0    potassium chloride SA (K-DUR,KLOR-CON) 20 MEQ tablet Take 20 mEq by mouth 2 (two) times daily.    pravastatin (PRAVACHOL) 40 MG tablet Take 1 tablet (40 mg total) by mouth every evening. Qty: 30 tablet, Refills: 6      STOP taking these medications     cloNIDine (CATAPRES - DOSED IN MG/24 HR) 0.2 mg/24hr patch        Allergies  Allergen Reactions  . Aceon [Perindopril] Other (See Comments)    Chest Pain  . Actonel [Risedronate Sodium] Other (See Comments)    Unknown  . Allegra [Fexofenadine] Other (See Comments)    BACK PAIN  . Beta Adrenergic Blockers Other (See Comments)    Serious cough both times she tried.  . Biaxin [Clarithromycin] Nausea Only  . Ciprofloxacin Nausea Only  . Crestor [Rosuvastatin] Itching  . Fosamax [Alendronate Sodium] Other (See Comments)    Muscle pain  . Lipitor [Atorvastatin] Itching  . Promethazine Hcl Other (See Comments)    Unknown  . Simvastatin Rash  . Sulfa Antibiotics Other (See Comments)    GI Upset  . Welchol [Colesevelam Hcl] Rash   Follow-up Information   Follow up with [facility]. (Equ[facility] trader, Physical Therapy and Occupational services to start within 24-48 hours of hospital discharge)    Contact information:   [address] [place] [address] [phone]        The results of significant diagnostics from this hospitalization (including imaging, microbiology, ancillary and laboratory) are listed below for reference.    Significant Diagnostic Studies: Dg Chest 2  View  07/24/2014   CLINICAL DATA:  80 year old female with shortness of breath. Initial encounter.  EXAM: CHEST  2 VIEW  COMPARISON:  08/12/2012.  FINDINGS: Small bilateral pleural effusions may represent changes of mild pulmonary edema superimposed upon chronic lung changes.  No segmental consolidation.  Cardiomegaly post CABG and valve replacement.  Calcified mildly tortuous aorta.  IMPRESSION: Small bilateral pleural effusions may represent changes of mild pulmonary edema superimposed upon chronic lung changes.  No segmental consolidation.  Cardiomegaly post CABG and valve replacement.  Calcified mildly tortuous aorta.   Electronically Signed   By: Chauncey Cruel M.D.   On: 07/24/2014 13:34    Microbiology: Recent Results (from the past 240 hour(s))  MRSA PCR  SCREENING     Status: None   Collection Time    07/24/14 11:29 PM      Result Value Ref Range Status   MRSA by PCR NEGATIVE  NEGATIVE Final   Comment:            The GeneXpert MRSA Assay (FDA     approved for NASAL specimens     only), is one component of a     comprehensive MRSA colonization     surveillance program. It is not     intended to diagnose MRSA     infection nor to guide or     monitor treatment for     MRSA infections.     Labs: Basic Metabolic Panel:  Recent Labs Lab 07/24/14 1219 07/24/14 2134 07/25/14 0123 07/26/14 0550 07/27/14 0310  NA 132*  --  139 140 136*  K 4.4  --  4.2 4.3 4.4  CL 94*  --  100 102 100  CO2 21  --  22 25 23   GLUCOSE 123*  --  111* 105* 107*  BUN 23  --  22 32* 35*  CREATININE 1.55* 1.65* 1.56* 1.89* 1.91*  CALCIUM 9.4  --  9.1 8.5 8.4   Liver Function Tests:  Recent Labs Lab 07/24/14 1219 07/25/14 0123 07/26/14 0550 07/27/14 0310  AST 25 18 13 13   ALT 26 22 16 13   ALKPHOS 54 54 46 43  BILITOT 0.4 0.3 0.2* <0.2*  PROT 7.3 7.0 6.4 6.3  ALBUMIN 3.5 3.2* 2.9* 2.9*   No results found for this basename: LIPASE, AMYLASE,  in the last 168 hours No results found for this  basename: AMMONIA,  in the last 168 hours CBC:  Recent Labs Lab 07/24/14 1219 07/24/14 2134 07/25/14 0123 07/26/14 0550 07/27/14 0310  WBC 8.1 6.8 6.4 6.5 7.2  NEUTROABS 6.3  --  3.7 3.3 3.6  HGB 11.7* 11.9* 11.6* 11.0* 11.1*  HCT 34.8* 35.7* 34.3* 34.1* 33.8*  MCV 86.1 84.4 84.5 87.7 85.8  PLT 221 251 219 228 261   Cardiac Enzymes:  Recent Labs Lab 07/24/14 2134 07/25/14 0133 07/25/14 0823  TROPONINI <0.30 <0.30 <0.30   BNP: BNP (last 3 results)  Recent Labs  07/24/14 1307  PROBNP 3852.0*   CBG: No results found for this basename: GLUCAP,  in the last 168 hours     Signed:  Velvet Bathe  [facility] 07/27/2014, 2:33 PM  Addendum:  Discharge was held yesterday 07/26/2014 secondary to elevated blood pressures. Blood pressures well controlled currently. Patient ready for discharge and cleared medically. Patient would like follow-up appointment with equal cardiology. She should see a cardiologist within the next 1-2 weeks.

## 2014-07-26 NOTE — Care Management Note (Addendum)
  Page 2 of 2   07/26/2014     2:37:19 PM CARE MANAGEMENT NOTE 07/26/2014  Patient:  ORLENE, SALMONS   Account Number:  [id number]  Date Initiated:  07/26/2014  Documentation initiated by:  Maretta Overdorf  Subjective/Objective Assessment:   Acute Respiratory failure with hypoxia, Pleural effusion     Action/Plan:   CM to follow for disposition needs   Anticipated DC Date:  07/26/2014   Anticipated DC Plan:  [place]  In-house referral  NA      DC Planning Services  CM consult      [facility] Choice  HOME HEALTH   Choice offered to / List presented to:  C-1 Patient        [place] arranged  HH-2 PT      [place].   Status of service:  Completed, signed off Medicare Important Message given?  NA - LOS <3 / Initial given by admissions (If response is "NO", the following Medicare IM given date fields will be blank) Date Medicare IM given:   Medicare IM given by:   Date Additional Medicare IM given:   Additional Medicare IM given by:    Discharge Disposition:  [place]  Per UR Regulation:  Reviewed for med. necessity/level of care/duration of stay  If discussed at [place] of Stay Meetings, dates discussed:    Comments:  Oather Muilenburg RN, BSN, MSHL, CCM  Nurse - Case Manager,  (Unit [place])  [phone]  07/26/2014 Social:  From [facility] DME:  RW Dispo:  ILF with [facility]HS:  PT ([facility] notified)

## 2014-07-26 NOTE — Progress Notes (Signed)
Pt c/o t his evening that left arm felt numb and notified Dr Wendee Beavers and ordered CT scan Lt ar for today.  Will continue to monitor.  Karie Kirks, RN,

## 2014-07-26 NOTE — Progress Notes (Signed)
Call received from Dr. Pamelia Hoit informing nurse not to check BP now, to check it after she she has received her dose of procardia, which has ben ordered.  Pt and daughter at bedside made aware.  Verbalized understanding.  Karie Kirks, Therapist, sports.

## 2014-07-26 NOTE — Progress Notes (Signed)
Occupational Therapy Treatment Patient Details Name: Victoria Banks MRN: [id number] DOB: 10/09/1920 Today's Date: 07/26/2014    History of present illness Pt adm with acute resp failure and CHF. PMH - CABG   OT comments  Pt with improvement in endurance and mobility for ADL.  Stood x 5 minutes at sink to perform grooming. Performed sit to stand multiple times at sink during bathing. Instructed in energy conservation and reviewed handout. Pt on RA with sats at 94% with activity.  Follow Up Recommendations  Home health OT;Supervision - Intermittent    Equipment Recommendations  None recommended by OT    Recommendations for Other Services      Precautions / Restrictions Precautions Precautions: Fall Precaution Comments: check sats off 02       Mobility Bed Mobility Overal bed mobility:  (pt up in chair)                Transfers Overall transfer level: Needs assistance Equipment used: None Transfers: Sit to/from Stand Sit to Stand: Supervision Stand pivot transfers: Supervision       General transfer comment: for safety    Balance     Sitting balance-Leahy Scale: Good       Standing balance-Leahy Scale: Fair                     ADL Overall ADL's : Needs assistance/impaired     Grooming: Oral care;Wash/dry face;Brushing hair;Wash/dry hands;Supervision/safety;Standing   Upper Body Bathing: Set up;Sitting Upper Body Bathing Details (indicate cue type and reason): at sink Lower Body Bathing: Supervison/ safety;Sit to/from stand   Upper Body Dressing : Set up;Sitting   Lower Body Dressing: Supervision/safety;Sit to/from stand   Toilet Transfer: Sales executive;Ambulation   Toileting- Clothing Manipulation and Hygiene: Supervision/safety;Sit to/from stand       Functional mobility during ADLs: Supervision/safety (in room, no device) General ADL Comments: Pt with good endurance for standing activities at sink.       Vision                     Perception     Praxis      Cognition   Behavior During Therapy: WFL for tasks assessed/performed Overall Cognitive Status: Within Functional Limits for tasks assessed                       Extremity/Trunk Assessment               Exercises     Shoulder Instructions       General Comments      Pertinent Vitals/ Pain       Pain Assessment: No/denies pain  Home Living                                          Prior Functioning/Environment              Frequency Min 2X/week     Progress Toward Goals  OT Goals(current goals can now be found in the care plan section)  Progress towards OT goals: Progressing toward goals  Acute Rehab OT Goals Patient Stated Goal: Return home Time For Goal Achievement: 08/01/14  Plan Discharge plan remains appropriate    Co-evaluation                 End of Session  Activity Tolerance Patient tolerated treatment well   Patient Left in chair;with call bell/phone within reach;with nursing/sitter in room   Nurse Communication Other (comment) (bath completed)        Time: 5364-6803 OT Time Calculation (min): 39 min  Charges: OT General Charges $OT Visit: 1 Procedure OT Treatments $Self Care/Home Management : 38-52 mins  Malka So 07/26/2014, 11:07 AM [phone]

## 2014-07-26 NOTE — Progress Notes (Signed)
Pt's BP 184/63 after hydralazine 10mg  iv given. At 1642.  Dr. Wendee Beavers informed and instructed ok to give scheduled hydralazine 10mg  po and also pt not to be d/c to day.  Pt and her daughter made aware.  Will continue to monitor.  Karie Kirks, Therapist, sports.

## 2014-07-26 NOTE — Progress Notes (Signed)
Pharmacist HF Discharge Medication Education  Patient was educated on process for home medication reconciliation and provided written instructions as well. Patient was also counseled on HF medications and changes to be made upon arriving home. She was provided with a medication bag to be brought with her to her follow up clinic visits with her current medications.   Harolyn Rutherford, PharmD Clinical Pharmacist - Resident Pager: [phone] Pharmacy: [phone] 07/26/2014 2:24 PM

## 2014-07-27 LAB — COMPREHENSIVE METABOLIC PANEL
ALT: 13 U/L (ref 0–35)
AST: 13 U/L (ref 0–37)
Albumin: 2.9 g/dL — ABNORMAL LOW (ref 3.5–5.2)
Alkaline Phosphatase: 43 U/L (ref 39–117)
Anion gap: 13 (ref 5–15)
BUN: 35 mg/dL — ABNORMAL HIGH (ref 6–23)
CO2: 23 meq/L (ref 19–32)
Calcium: 8.4 mg/dL (ref 8.4–10.5)
Chloride: 100 mEq/L (ref 96–112)
Creatinine, Ser: 1.91 mg/dL — ABNORMAL HIGH (ref 0.50–1.10)
GFR calc non Af Amer: 22 mL/min — ABNORMAL LOW (ref >90)
GFR, EST AFRICAN AMERICAN: 25 mL/min — AB (ref >90)
Glucose, Bld: 107 mg/dL — ABNORMAL HIGH (ref 70–99)
Potassium: 4.4 mEq/L (ref 3.7–5.3)
SODIUM: 136 meq/L — AB (ref 137–147)
TOTAL PROTEIN: 6.3 g/dL (ref 6.0–8.3)
Total Bilirubin: <0.2 mg/dL — ABNORMAL LOW (ref 0.3–1.2)

## 2014-07-27 LAB — CBC WITH DIFFERENTIAL/PLATELET
BASOS PCT: 0 % (ref 0–1)
Basophils Absolute: 0 10*3/uL (ref 0.0–0.1)
EOS ABS: 0.5 10*3/uL (ref 0.0–0.7)
Eosinophils Relative: 7 % — ABNORMAL HIGH (ref 0–5)
HEMATOCRIT: 33.8 % — AB (ref 36.0–46.0)
HEMOGLOBIN: 11.1 g/dL — AB (ref 12.0–15.0)
Lymphocytes Relative: 35 % (ref 12–46)
Lymphs Abs: 2.5 10*3/uL (ref 0.7–4.0)
MCH: 28.2 pg (ref 26.0–34.0)
MCHC: 32.8 g/dL (ref 30.0–36.0)
MCV: 85.8 fL (ref 78.0–100.0)
MONO ABS: 0.6 10*3/uL (ref 0.1–1.0)
Monocytes Relative: 8 % (ref 3–12)
NEUTROS PCT: 50 % (ref 43–77)
Neutro Abs: 3.6 10*3/uL (ref 1.7–7.7)
Platelets: 261 10*3/uL (ref 150–400)
RBC: 3.94 MIL/uL (ref 3.87–5.11)
RDW: 13.8 % (ref 11.5–15.5)
WBC: 7.2 10*3/uL (ref 4.0–10.5)

## 2014-07-27 MED ORDER — FUROSEMIDE 80 MG PO TABS: 80.0000 mg | ORAL_TABLET | Freq: Every day | ORAL | Status: DC

## 2014-07-27 NOTE — Progress Notes (Signed)
Pt with 6 beats wide QRS. MD on call paged. Reported to day shift RN. Pt asymptomatic and sleeping at time

## 2014-07-27 NOTE — Progress Notes (Signed)
Patient is being discharged home. Pt has been provided with discharge instructions. RN went over discharge instructions with the patient and answered all questions that she had. She is being transported home by her daughter

## 2014-07-27 NOTE — Progress Notes (Signed)
Physical Therapy Treatment Patient Details Name: Victoria Banks MRN: [id number] DOB: March 12, 1921 Today's Date: 06-Aug-2014    History of Present Illness Pt adm with acute resp failure and CHF. PMH - CABG    PT Comments    Pt steadier using rolling walker. Recommend rolling walker for home.  Follow Up Recommendations  Home health PT;Supervision - Intermittent     Equipment Recommendations  Rolling walker with 5" wheels    Recommendations for Other Services       Precautions / Restrictions Precautions Precautions: Fall Restrictions Weight Bearing Restrictions: No    Mobility  Bed Mobility                  Transfers Overall transfer level: Modified independent   Transfers: Sit to/from Stand Sit to Stand: Modified independent (Device/Increase time)            Ambulation/Gait Ambulation/Gait assistance: Supervision Ambulation Distance (Feet): 225 Feet Assistive device: Rolling walker (2 wheeled) Gait Pattern/deviations: Step-through pattern;Decreased stride length     General Gait Details: Pt steadier using rolling walker.   Stairs            Wheelchair Mobility    Modified Rankin (Stroke Patients Only)       Balance Overall balance assessment: Needs assistance Sitting-balance support: No upper extremity supported Sitting balance-Leahy Scale: Good     Standing balance support: No upper extremity supported Standing balance-Leahy Scale: Fair                      Cognition Arousal/Alertness: Awake/alert Behavior During Therapy: WFL for tasks assessed/performed Overall Cognitive Status: Within Functional Limits for tasks assessed                      Exercises      General Comments        Pertinent Vitals/Pain Pain Assessment: No/denies pain    Home Living                      Prior Function            PT Goals (current goals can now be found in the care plan section) Progress towards PT goals:  Progressing toward goals    Frequency  Min 3X/week    PT Plan Current plan remains appropriate    Co-evaluation             End of Session   Activity Tolerance: Patient tolerated treatment well Patient left: in chair;with call bell/phone within reach     Time: 9390-3009 PT Time Calculation (min): 13 min  Charges:  $Gait Training: 8-22 mins                    G Codes:      Mylisa Brunson 2014-08-06, 9:54 AM  [employer] PT [phone]

## 2014-07-30 DIAGNOSIS — I514 Myocarditis, unspecified: Secondary | ICD-10-CM | POA: Diagnosis not present

## 2014-07-30 DIAGNOSIS — N184 Chronic kidney disease, stage 4 (severe): Secondary | ICD-10-CM | POA: Diagnosis not present

## 2014-07-30 DIAGNOSIS — I129 Hypertensive chronic kidney disease with stage 1 through stage 4 chronic kidney disease, or unspecified chronic kidney disease: Secondary | ICD-10-CM | POA: Diagnosis not present

## 2014-07-30 DIAGNOSIS — I2510 Atherosclerotic heart disease of native coronary artery without angina pectoris: Secondary | ICD-10-CM | POA: Diagnosis not present

## 2014-07-30 DIAGNOSIS — Z23 Encounter for immunization: Secondary | ICD-10-CM | POA: Diagnosis not present

## 2014-07-30 DIAGNOSIS — I5031 Acute diastolic (congestive) heart failure: Secondary | ICD-10-CM | POA: Diagnosis not present

## 2014-07-31 ENCOUNTER — Encounter: Payer: Medicare Other | Admitting: Cardiology

## 2014-07-31 DIAGNOSIS — I5033 Acute on chronic diastolic (congestive) heart failure: Secondary | ICD-10-CM | POA: Diagnosis not present

## 2014-07-31 DIAGNOSIS — R600 Localized edema: Secondary | ICD-10-CM | POA: Diagnosis not present

## 2014-07-31 DIAGNOSIS — I129 Hypertensive chronic kidney disease with stage 1 through stage 4 chronic kidney disease, or unspecified chronic kidney disease: Secondary | ICD-10-CM | POA: Diagnosis not present

## 2014-07-31 DIAGNOSIS — N183 Chronic kidney disease, stage 3 (moderate): Secondary | ICD-10-CM | POA: Diagnosis not present

## 2014-07-31 DIAGNOSIS — Z79899 Other long term (current) drug therapy: Secondary | ICD-10-CM | POA: Diagnosis not present

## 2014-08-02 ENCOUNTER — Telehealth: Payer: Self-pay | Admitting: Cardiology

## 2014-08-02 NOTE — Telephone Encounter (Signed)
Pt Signed ROI to have records sent to [facility], I called and Spoke With her and Let Her Know [facility] has [place], she asked For ROI to be Destroyed.

## 2014-08-06 DIAGNOSIS — Z79899 Other long term (current) drug therapy: Secondary | ICD-10-CM | POA: Diagnosis not present

## 2014-08-14 ENCOUNTER — Encounter (HOSPITAL_COMMUNITY): Payer: Self-pay

## 2014-08-14 ENCOUNTER — Ambulatory Visit (HOSPITAL_COMMUNITY)
Admission: RE | Admit: 2014-08-14 | Discharge: 2014-08-14 | Disposition: A | Discharge status: Home / Self Care | Payer: Medicare Other | Source: Ambulatory Visit | Attending: Cardiology | Admitting: Cardiology

## 2014-08-14 VITALS — BP 186/72 | HR 96 | Wt 118.4 lb

## 2014-08-14 DIAGNOSIS — E785 Hyperlipidemia, unspecified: Secondary | ICD-10-CM | POA: Diagnosis not present

## 2014-08-14 DIAGNOSIS — Z7982 Long term (current) use of aspirin: Secondary | ICD-10-CM | POA: Insufficient documentation

## 2014-08-14 DIAGNOSIS — I129 Hypertensive chronic kidney disease with stage 1 through stage 4 chronic kidney disease, or unspecified chronic kidney disease: Secondary | ICD-10-CM | POA: Diagnosis not present

## 2014-08-14 DIAGNOSIS — K219 Gastro-esophageal reflux disease without esophagitis: Secondary | ICD-10-CM | POA: Diagnosis not present

## 2014-08-14 DIAGNOSIS — I359 Nonrheumatic aortic valve disorder, unspecified: Secondary | ICD-10-CM | POA: Insufficient documentation

## 2014-08-14 DIAGNOSIS — I2510 Atherosclerotic heart disease of native coronary artery without angina pectoris: Secondary | ICD-10-CM | POA: Diagnosis not present

## 2014-08-14 DIAGNOSIS — I10 Essential (primary) hypertension: Secondary | ICD-10-CM | POA: Diagnosis not present

## 2014-08-14 DIAGNOSIS — Z79899 Other long term (current) drug therapy: Secondary | ICD-10-CM | POA: Insufficient documentation

## 2014-08-14 DIAGNOSIS — I491 Atrial premature depolarization: Secondary | ICD-10-CM | POA: Insufficient documentation

## 2014-08-14 DIAGNOSIS — E042 Nontoxic multinodular goiter: Secondary | ICD-10-CM | POA: Diagnosis not present

## 2014-08-14 DIAGNOSIS — N184 Chronic kidney disease, stage 4 (severe): Secondary | ICD-10-CM | POA: Insufficient documentation

## 2014-08-14 DIAGNOSIS — I5032 Chronic diastolic (congestive) heart failure: Secondary | ICD-10-CM | POA: Insufficient documentation

## 2014-08-14 DIAGNOSIS — Z951 Presence of aortocoronary bypass graft: Secondary | ICD-10-CM | POA: Diagnosis not present

## 2014-08-14 LAB — BASIC METABOLIC PANEL
Anion gap: 15 (ref 5–15)
BUN: 28 mg/dL — AB (ref 6–23)
CHLORIDE: 99 meq/L (ref 96–112)
CO2: 23 meq/L (ref 19–32)
CREATININE: 1.47 mg/dL — AB (ref 0.50–1.10)
Calcium: 9.5 mg/dL (ref 8.4–10.5)
GFR calc non Af Amer: 30 mL/min — ABNORMAL LOW (ref >90)
GFR, EST AFRICAN AMERICAN: 35 mL/min — AB (ref >90)
GLUCOSE: 141 mg/dL — AB (ref 70–99)
POTASSIUM: 4.4 meq/L (ref 3.7–5.3)
Sodium: 137 mEq/L (ref 137–147)

## 2014-08-14 LAB — PRO B NATRIURETIC PEPTIDE: Pro B Natriuretic peptide (BNP): 1863 pg/mL — ABNORMAL HIGH (ref 0–450)

## 2014-08-14 MED ORDER — HYDRALAZINE HCL 25 MG PO TABS: 25.0000 mg | ORAL_TABLET | Freq: Three times a day (TID) | ORAL | Status: DC

## 2014-08-14 NOTE — Patient Instructions (Signed)
Doing great!  Increase hydralazine to 25 mg tablet 3 times daily. Can take 2 of your 10mg  tablets 3 times daily until you get refill of correct dose.  Follow up with our office in 3 weeks.  Happy Thanksgiving!  Do the following things EVERYDAY: 1) Weigh yourself in the morning before breakfast. Write it down and keep it in a log. 2) Take your medicines as prescribed 3) Eat low salt foods-Limit salt (sodium) to 2000 mg per day.  4) Stay as active as you can everyday 5) Limit all fluids for the day to less than 2 liters

## 2014-08-14 NOTE — Progress Notes (Signed)
Patient ID: Victoria Banks, female   DOB: 02-21-1921, 80 y.o.   MRN: [id number] PCP: Dr. Felipa Eth  80 yo with history of bioprosthetic AVR, CAD, and chronic diastolic CHF presents for cardiology evaluation after recent admission with CHF in 10/15. Patient states that prior to this admission, she had been started on clonidine for elevated BP.  This caused a dry mouth, so her Lasix was decreased to 40 mg daily.  Prior to this, she had been on Lasix 80 mg daily long-term.  She then developed gradually worsening exertional dyspnea and [facility] on chronic diastolic CHF on 73/53/29.  She was diuresed with IV Lasix and actually developed AKI.    She was discharged and is now back at [facility].  She is breathing well now.  She feels like she is back at her prior baseline. She is taking Lasix 80 mg daily. No orthopnea or PND.  She has not been particularly active since getting back home but denies dyspnea walking around her house or out to her car.  No lightheadedness.  BP has been running high, 186/72 today. No chest pain.   ECG: NSR with PACs  Labs (10/15) with K 4.4, creatinine 1.9, HCT 33.8  PMH: 1. HTN: Clonidine caused a dry mouth. 2. Hyperlipidemia 3. GERD 4. Aortic stenosis: s/p bioprosthetic aortic valve replacement in 2/11.  5. CAD: s/p SVG-OM in 2/11 with AVR.  6. Tremor 7. Multinodular goiter 8. Chronic diastolic CHF: Echo (92/42) with EF 55-60%, mild LVH, moderate diastolic dysfunction, normal bioprosthetic aortic valve, mild MR, PA systolic pressure 37 mmHg.   SH: Nonsmoker, lives at [facility] independent living, daughter is in town.   FH: No premature CAD.  ROS: All systems reviewed and negative except as per HPI.   Current Outpatient Prescriptions  Medication Sig Dispense Refill  . aspirin 81 MG tablet Take 81 mg by mouth daily.    . calcium citrate-vitamin D (CALCIUM CITRATE +) 315-200 MG-UNIT per tablet Take 1 tablet by mouth 2  (two) times daily.    . furosemide (LASIX) 80 MG tablet Take 1 tablet (80 mg total) by mouth daily.    . hydrALAZINE (APRESOLINE) 25 MG tablet Take 1 tablet (25 mg total) by mouth 3 (three) times daily. 90 tablet 3  . ipratropium (ATROVENT) 0.03 % nasal spray Place 2 sprays into both nostrils at bedtime as needed for rhinitis.     Marland Kitchen ketotifen (THERA TEARS ALLERGY) 0.025 % ophthalmic solution Place 1 drop into both eyes as needed (for dry eyes).    . LORazepam (ATIVAN) 0.5 MG tablet Take 0.5 mg by mouth at bedtime.     Marland Kitchen losartan (COZAAR) 100 MG tablet Take 1 tablet (100 mg total) by mouth daily. 30 tablet 3  . MELATONIN PO Take 1 tablet by mouth at bedtime.     . Multiple Vitamins-Minerals (CENTRUM SILVER ULTRA WOMENS PO) Take 1 tablet by mouth daily.    Marland Kitchen NIFEdipine (ADALAT CC) 90 MG 24 hr tablet Take 1 tablet (90 mg total) by mouth daily. 30 tablet 0  . potassium chloride SA (K-DUR,KLOR-CON) 20 MEQ tablet Take 20 mEq by mouth 2 (two) times daily.    . pravastatin (PRAVACHOL) 40 MG tablet Take 1 tablet (40 mg total) by mouth every evening. 30 tablet 6   No current facility-administered medications for this encounter.   BP 186/72 mmHg  Pulse 96  Wt 118 lb 6.4 oz (53.706 kg)  SpO2 96%  General: NAD Neck: No JVD, no thyromegaly or thyroid nodule.  Lungs: Clear to auscultation bilaterally with normal respiratory effort. CV: Nondisplaced PMI.  Heart regular S1/S2, no S3/S4, 2/6 early SEM RUSB.  Trace ankle edema.  No carotid bruit.  Normal pedal pulses.  Abdomen: Soft, nontender, no hepatosplenomegaly, no distention.  Skin: Intact without lesions or rashes.  Neurologic: Alert and oriented x 3.  Psych: Normal affect. Extremities: No clubbing or cyanosis.  HEENT: Normal.   Assessment/Plan: 1. Chronic diastolic CHF: Patient actually seems to be doing well now that she is back on Lasix 80 mg daily.  She had been taking this dose x years and developed CHF exacerbation when it was cut in half.  She currently does not appear volume overloaded on exam.  - Continue Lasix 80 mg daily.   - BMET/BNP today.  2. HTN: BP quite high today, she took all her meds.  I will increase hydralazine to 25 mg tid. Continue current nifedipine XL and losartan.  If creatinine rises any further, may need to cut back on losartan. 3. CKD: Will check BMET today.  She had AKI in the hospital.  4. Bioprosthetic aortic valve: Valve looked ok on 10/15 echo. 5. CAD: s/p CABG with SVG-OM at time of AVR.  She is on ASA 81 and statin. No chest pain.   Loralie Champagne 08/15/2014

## 2014-08-15 DIAGNOSIS — I5032 Chronic diastolic (congestive) heart failure: Secondary | ICD-10-CM | POA: Insufficient documentation

## 2014-08-15 NOTE — Addendum Note (Signed)
Encounter addended by: Georga Kaufmann, CCT on: 08/15/2014  7:53 AM<BR>     Documentation filed: Charges VN

## 2014-08-21 ENCOUNTER — Other Ambulatory Visit: Payer: Self-pay

## 2014-08-27 DIAGNOSIS — I2510 Atherosclerotic heart disease of native coronary artery without angina pectoris: Secondary | ICD-10-CM | POA: Diagnosis not present

## 2014-08-27 DIAGNOSIS — Z79899 Other long term (current) drug therapy: Secondary | ICD-10-CM | POA: Diagnosis not present

## 2014-08-27 DIAGNOSIS — I5031 Acute diastolic (congestive) heart failure: Secondary | ICD-10-CM | POA: Diagnosis not present

## 2014-08-27 DIAGNOSIS — I5033 Acute on chronic diastolic (congestive) heart failure: Secondary | ICD-10-CM | POA: Diagnosis not present

## 2014-08-27 DIAGNOSIS — N189 Chronic kidney disease, unspecified: Secondary | ICD-10-CM | POA: Diagnosis not present

## 2014-08-27 DIAGNOSIS — R300 Dysuria: Secondary | ICD-10-CM | POA: Diagnosis not present

## 2014-08-27 DIAGNOSIS — I514 Myocarditis, unspecified: Secondary | ICD-10-CM | POA: Diagnosis not present

## 2014-08-27 DIAGNOSIS — N183 Chronic kidney disease, stage 3 (moderate): Secondary | ICD-10-CM | POA: Diagnosis not present

## 2014-08-27 DIAGNOSIS — I129 Hypertensive chronic kidney disease with stage 1 through stage 4 chronic kidney disease, or unspecified chronic kidney disease: Secondary | ICD-10-CM | POA: Diagnosis not present

## 2014-09-04 ENCOUNTER — Encounter (HOSPITAL_COMMUNITY): Payer: Self-pay

## 2014-09-04 ENCOUNTER — Ambulatory Visit (HOSPITAL_COMMUNITY)
Admission: RE | Admit: 2014-09-04 | Discharge: 2014-09-04 | Disposition: A | Discharge status: Home / Self Care | Payer: Medicare Other | Source: Ambulatory Visit | Attending: Cardiology | Admitting: Cardiology

## 2014-09-04 VITALS — BP 166/58 | HR 86 | Wt 119.8 lb

## 2014-09-04 DIAGNOSIS — I129 Hypertensive chronic kidney disease with stage 1 through stage 4 chronic kidney disease, or unspecified chronic kidney disease: Secondary | ICD-10-CM | POA: Diagnosis not present

## 2014-09-04 DIAGNOSIS — I5032 Chronic diastolic (congestive) heart failure: Secondary | ICD-10-CM | POA: Diagnosis not present

## 2014-09-04 DIAGNOSIS — Z951 Presence of aortocoronary bypass graft: Secondary | ICD-10-CM | POA: Insufficient documentation

## 2014-09-04 DIAGNOSIS — I2510 Atherosclerotic heart disease of native coronary artery without angina pectoris: Secondary | ICD-10-CM | POA: Diagnosis not present

## 2014-09-04 DIAGNOSIS — I359 Nonrheumatic aortic valve disorder, unspecified: Secondary | ICD-10-CM | POA: Diagnosis not present

## 2014-09-04 DIAGNOSIS — K219 Gastro-esophageal reflux disease without esophagitis: Secondary | ICD-10-CM | POA: Insufficient documentation

## 2014-09-04 DIAGNOSIS — Z79899 Other long term (current) drug therapy: Secondary | ICD-10-CM | POA: Insufficient documentation

## 2014-09-04 DIAGNOSIS — Z7982 Long term (current) use of aspirin: Secondary | ICD-10-CM | POA: Insufficient documentation

## 2014-09-04 DIAGNOSIS — E042 Nontoxic multinodular goiter: Secondary | ICD-10-CM | POA: Insufficient documentation

## 2014-09-04 DIAGNOSIS — N184 Chronic kidney disease, stage 4 (severe): Secondary | ICD-10-CM | POA: Diagnosis not present

## 2014-09-04 DIAGNOSIS — Z953 Presence of xenogenic heart valve: Secondary | ICD-10-CM | POA: Insufficient documentation

## 2014-09-04 DIAGNOSIS — E785 Hyperlipidemia, unspecified: Secondary | ICD-10-CM | POA: Diagnosis not present

## 2014-09-04 DIAGNOSIS — N189 Chronic kidney disease, unspecified: Secondary | ICD-10-CM | POA: Diagnosis not present

## 2014-09-04 MED ORDER — NIFEDIPINE ER 60 MG PO TB24: 120.0000 mg | ORAL_TABLET | Freq: Every day | ORAL | Status: DC

## 2014-09-04 NOTE — Progress Notes (Signed)
Patient ID: Victoria Banks, female   DOB: 1920/11/16, 80 y.o.   MRN: [id number] PCP: Dr. Felipa Eth  80 yo with history of bioprosthetic AVR, CAD, and chronic diastolic CHF presents for cardiology followup after recent admission with CHF in 10/15. Patient states that prior to this admission, she had been started on clonidine for elevated BP.  This caused a dry mouth, so her Lasix was decreased to 40 mg daily.  Prior to this, she had been on Lasix 80 mg daily long-term.  She then developed gradually worsening exertional dyspnea and was admitted to [facility] with acute on chronic diastolic CHF on 79/15/05.  She was diuresed with IV Lasix and actually developed AKI.    She was discharged and is now back at [facility].  She is breathing well now.  She feels like she is back at her prior baseline. She is taking Lasix 80 mg daily. No orthopnea or PND.  She has not been particularly active since getting back home but denies dyspnea walking around her house or out to her car.  No lightheadedness.  No chest pain.  Weight is stable.  BP is still running high, 140s-160s when she takes it at home.  She has a hard time remembering to take the 2nd dose of hydralazine.   Labs (10/15): K 4.4, creatinine 1.9, HCT 33.8 Labs (11/15): K 4.4, creatinine 1.47, BNP 1863  PMH: 1. HTN: Clonidine caused a dry mouth. 2. Hyperlipidemia 3. GERD 4. Aortic stenosis: s/p bioprosthetic aortic valve replacement in 2/11.  5. CAD: s/p SVG-OM in 2/11 with AVR.  6. Tremor 7. Multinodular goiter 8. Chronic diastolic CHF: Echo (69/79) with EF 55-60%, mild LVH, moderate diastolic dysfunction, normal bioprosthetic aortic valve, mild MR, PA systolic pressure 37 mmHg.   SH: Nonsmoker, lives at [facility] independent living, daughter is in town.   FH: No premature CAD.  ROS: All systems reviewed and negative except as per HPI.   Current Outpatient Prescriptions  Medication Sig Dispense Refill  . aspirin 81 MG tablet  Take 81 mg by mouth daily.    . calcium citrate-vitamin D (CALCIUM CITRATE +) 315-200 MG-UNIT per tablet Take 1 tablet by mouth 2 (two) times daily.    . furosemide (LASIX) 80 MG tablet Take 1 tablet (80 mg total) by mouth daily.    . hydrALAZINE (APRESOLINE) 25 MG tablet Take 1 tablet (25 mg total) by mouth 3 (three) times daily. 90 tablet 3  . ipratropium (ATROVENT) 0.03 % nasal spray Place 2 sprays into both nostrils at bedtime as needed for rhinitis.     Marland Kitchen ketotifen (THERA TEARS ALLERGY) 0.025 % ophthalmic solution Place 1 drop into both eyes as needed (for dry eyes).    . LORazepam (ATIVAN) 0.5 MG tablet Take 0.5 mg by mouth at bedtime.     Marland Kitchen losartan (COZAAR) 100 MG tablet Take 1 tablet (100 mg total) by mouth daily. 30 tablet 3  . MELATONIN PO Take 1 tablet by mouth at bedtime.     . Multiple Vitamins-Minerals (CENTRUM SILVER ULTRA WOMENS PO) Take 1 tablet by mouth daily.    Marland Kitchen NIFEdipine (PROCARDIA-XL/ADALAT CC) 60 MG 24 hr tablet Take 2 tablets (120 mg total) by mouth daily. 60 tablet 3  . potassium chloride SA (K-DUR,KLOR-CON) 20 MEQ tablet Take 20 mEq by mouth 2 (two) times daily.    . pravastatin (PRAVACHOL) 40 MG tablet Take 1 tablet (40 mg total) by mouth every evening. 30 tablet 6  No current facility-administered medications for this encounter.   BP 166/58 mmHg  Pulse 86  Wt 119 lb 12.8 oz (54.341 kg)  SpO2 95% General: NAD Neck: No JVD, no thyromegaly or thyroid nodule.  Lungs: Clear to auscultation bilaterally with normal respiratory effort. CV: Nondisplaced PMI.  Heart regular S1/S2, no S3/S4, 2/6 early SEM RUSB.  No edema.  No carotid bruit.  Normal pedal pulses.  Abdomen: Soft, nontender, no hepatosplenomegaly, no distention.  Skin: Intact without lesions or rashes.  Neurologic: Alert and oriented x 3.  Psych: Normal affect. Extremities: No clubbing or cyanosis.  HEENT: Normal.   Assessment/Plan: 1. Chronic diastolic CHF: Patient actually seems to be doing well  now that she is back on Lasix 80 mg daily.  She had been taking this dose x years and developed CHF exacerbation when it was cut in half. She currently does not appear volume overloaded on exam.  - Continue Lasix 80 mg daily.    2. HTN: BP still high.  I will have her increase nifedipine XL to 120 mg daily.  3. CKD: Creatinine improved when last checked in 11/15.  Repeat creatinine again in 2 months.  4. Bioprosthetic aortic valve: Valve looked ok on 10/15 echo. 5. CAD: s/p CABG with SVG-OM at time of AVR.  She is on ASA 81 and statin. No chest pain.   Loralie Champagne 09/04/2014

## 2014-09-04 NOTE — Patient Instructions (Addendum)
Change your Nifedipine to 60 mg tablets, take 2 tabs (120 mg) daily  Your physician recommends that you schedule a follow-up appointment in: 2 months

## 2014-09-11 DIAGNOSIS — Z79899 Other long term (current) drug therapy: Secondary | ICD-10-CM | POA: Diagnosis not present

## 2014-10-05 ENCOUNTER — Encounter: Payer: Self-pay | Admitting: Cardiology

## 2014-10-17 ENCOUNTER — Other Ambulatory Visit: Payer: Self-pay

## 2014-10-17 MED ORDER — POTASSIUM CHLORIDE CRYS ER 20 MEQ PO TBCR: 20.0000 meq | EXTENDED_RELEASE_TABLET | Freq: Two times a day (BID) | ORAL | Status: DC

## 2014-10-31 ENCOUNTER — Ambulatory Visit (HOSPITAL_COMMUNITY)
Admission: RE | Admit: 2014-10-31 | Discharge: 2014-10-31 | Disposition: A | Discharge status: Home / Self Care | Payer: Medicare Other | Source: Ambulatory Visit | Attending: Internal Medicine | Admitting: Internal Medicine

## 2014-10-31 ENCOUNTER — Encounter (HOSPITAL_COMMUNITY): Payer: Self-pay

## 2014-10-31 VITALS — BP 150/50 | HR 73 | Resp 18 | Wt 121.8 lb

## 2014-10-31 DIAGNOSIS — N184 Chronic kidney disease, stage 4 (severe): Secondary | ICD-10-CM

## 2014-10-31 DIAGNOSIS — Z951 Presence of aortocoronary bypass graft: Secondary | ICD-10-CM | POA: Diagnosis not present

## 2014-10-31 DIAGNOSIS — I10 Essential (primary) hypertension: Secondary | ICD-10-CM | POA: Diagnosis not present

## 2014-10-31 DIAGNOSIS — N189 Chronic kidney disease, unspecified: Secondary | ICD-10-CM | POA: Diagnosis not present

## 2014-10-31 DIAGNOSIS — I2510 Atherosclerotic heart disease of native coronary artery without angina pectoris: Secondary | ICD-10-CM | POA: Diagnosis not present

## 2014-10-31 DIAGNOSIS — I5032 Chronic diastolic (congestive) heart failure: Secondary | ICD-10-CM | POA: Diagnosis not present

## 2014-10-31 DIAGNOSIS — Z954 Presence of other heart-valve replacement: Secondary | ICD-10-CM | POA: Insufficient documentation

## 2014-10-31 LAB — BASIC METABOLIC PANEL
Anion gap: 7 (ref 5–15)
BUN: 22 mg/dL (ref 6–23)
CALCIUM: 9 mg/dL (ref 8.4–10.5)
CO2: 28 mmol/L (ref 19–32)
Chloride: 104 mmol/L (ref 96–112)
Creatinine, Ser: 1.65 mg/dL — ABNORMAL HIGH (ref 0.50–1.10)
GFR calc Af Amer: 30 mL/min — ABNORMAL LOW (ref >90)
GFR, EST NON AFRICAN AMERICAN: 26 mL/min — AB (ref >90)
Glucose, Bld: 102 mg/dL — ABNORMAL HIGH (ref 70–99)
Potassium: 4.4 mmol/L (ref 3.5–5.1)
Sodium: 139 mmol/L (ref 135–145)

## 2014-10-31 MED ORDER — HYDRALAZINE HCL 25 MG PO TABS: 37.5000 mg | ORAL_TABLET | Freq: Three times a day (TID) | ORAL | Status: DC

## 2014-10-31 NOTE — Patient Instructions (Signed)
INCREASE Hydralazine to 37.5mg  (1.5 tablets) twice daily.  Follow up 4 months. Call us sooner if blood pressure does not improve.  Do the following things EVERYDAY: 1) Weigh yourself in the morning before breakfast. Write it down and keep it in a log. 2) Take your medicines as prescribed 3) Eat low salt foods-Limit salt (sodium) to 2000 mg per day.  4) Stay as active as you can everyday 5) Limit all fluids for the day to less than 2 liters

## 2014-10-31 NOTE — Progress Notes (Signed)
Patient ID: Victoria Banks, female   DOB: Jul 05, 1921, 81 y.o.   MRN: [id number] PCP: Dr. Felipa Eth  81 yo with history of bioprosthetic AVR, CAD, and chronic diastolic.  Admitted to [facility] with acute on chronic diastolic CHF on 34/19/62.  She was diuresed with IV Lasix and actually developed AKI.    81 She returns for follow up. Last visit nifedipine and hydralazine increased.  Overall feeing good. Denies SOB/Orhtoepna. SBP athome 120-140s. Weight at home 119-120 pounds. Exercising 3 times a week on stationary bike.   Labs (10/15): K 4.4, creatinine 1.9, HCT 33.8 Labs (11/15): K 4.4, creatinine 1.47, BNP 1863  PMH: 1. HTN: Clonidine caused a dry mouth. 2. Hyperlipidemia 3. GERD 4. Aortic stenosis: s/p bioprosthetic aortic valve replacement in 2/11.  5. CAD: s/p SVG-OM in 2/11 with AVR.  6. Tremor 7. Multinodular goiter 8. Chronic diastolic CHF: Echo (22/97) with EF 55-60%, mild LVH, moderate diastolic dysfunction, normal bioprosthetic aortic valve, mild MR, PA systolic pressure 37 mmHg.   SH: Nonsmoker, lives at [facility] independent living, daughter is in town.   FH: No premature CAD.  ROS: All systems reviewed and negative except as per HPI.   Current Outpatient Prescriptions  Medication Sig Dispense Refill  . aspirin 81 MG tablet Take 81 mg by mouth daily.    . calcium citrate-vitamin D (CALCIUM CITRATE +) 315-200 MG-UNIT per tablet Take 1 tablet by mouth 2 (two) times daily.    . furosemide (LASIX) 80 MG tablet Take 1 tablet (80 mg total) by mouth daily.    . hydrALAZINE (APRESOLINE) 25 MG tablet Take 1 tablet (25 mg total) by mouth 3 (three) times daily. 90 tablet 3  . ipratropium (ATROVENT) 0.03 % nasal spray Place 2 sprays into both nostrils at bedtime as needed for rhinitis.     Marland Kitchen ketotifen (THERA TEARS ALLERGY) 0.025 % ophthalmic solution Place 1 drop into both eyes as needed (for dry eyes).    . LORazepam (ATIVAN) 0.5 MG tablet Take 0.5 mg by mouth at  bedtime.     Marland Kitchen losartan (COZAAR) 100 MG tablet Take 1 tablet (100 mg total) by mouth daily. 30 tablet 3  . MELATONIN PO Take 1 tablet by mouth at bedtime.     . Multiple Vitamins-Minerals (CENTRUM SILVER ULTRA WOMENS PO) Take 1 tablet by mouth daily.    Marland Kitchen NIFEdipine (PROCARDIA-XL/ADALAT CC) 60 MG 24 hr tablet Take 2 tablets (120 mg total) by mouth daily. 60 tablet 3  . potassium chloride SA (K-DUR,KLOR-CON) 20 MEQ tablet Take 1 tablet (20 mEq total) by mouth 2 (two) times daily. 60 tablet 6  . pravastatin (PRAVACHOL) 40 MG tablet Take 1 tablet (40 mg total) by mouth every evening. 30 tablet 6   No current facility-administered medications for this encounter.   BP 150/50 mmHg  Pulse 73  Resp 18  Wt 121 lb 12 oz (55.225 kg)  SpO2 98% General: NAD Neck: No JVD, no thyromegaly or thyroid nodule.  Lungs: Clear to auscultation bilaterally with normal respiratory effort. CV: Nondisplaced PMI.  Heart regular S1/S2, no S3/S4, 2/6 early SEM RUSB.  No edema.  No carotid bruit.  Normal pedal pulses.  Abdomen: Soft, nontender, no hepatosplenomegaly, no distention.  Skin: Intact without lesions or rashes.  Neurologic: Alert and oriented x 3.  Psych: Normal affect. Extremities: No clubbing or cyanosis.  HEENT: Normal.   Assessment/Plan: 1. Chronic diastolic CHF:  - Volume status stable. Continue Lasix 80 mg daily.  2. HTN: BP improved but a little higher than I would like.  Reviewed home SBP and seems to be in the 140s. Continue current BP and increase hydralazine 37.5 mg tid.  3. CKD:  Repeat BMET today.   4. Bioprosthetic aortic valve: Valve looked ok on 10/15 echo. 5. CAD: s/p CABG with SVG-OM at time of AVR.  She is on ASA 81 and statin. No chest pain.   Follow up 4 months   Eulonda Andalon Np-C  10/31/2014

## 2014-11-08 ENCOUNTER — Telehealth (HOSPITAL_COMMUNITY): Payer: Self-pay | Admitting: Vascular Surgery

## 2014-11-08 NOTE — Telephone Encounter (Signed)
PT called her bp is still running high, she was here 10/31/14 her bp medicine was increased pt states that its not working

## 2014-11-08 NOTE — Telephone Encounter (Signed)
Attempted to call, no answer.  Per Darrick Grinder NP-C continue current medication regimen, BP is okay at current value as long as patient is not symptomatic.  Will attempt to call again tomorrow and as her to continue to keep track of BP.

## 2014-11-16 ENCOUNTER — Other Ambulatory Visit: Payer: Self-pay | Admitting: Cardiology

## 2014-11-21 ENCOUNTER — Telehealth (HOSPITAL_COMMUNITY): Payer: Self-pay | Admitting: Vascular Surgery

## 2014-11-21 NOTE — Telephone Encounter (Signed)
Pt called when she was in here FEB 3 she was told to take an half of Hydralazine for her BP.Marland Kitchen Her bp is still running high 160's .Marland Kitchen Please advise

## 2014-11-29 DIAGNOSIS — Z961 Presence of intraocular lens: Secondary | ICD-10-CM | POA: Diagnosis not present

## 2014-11-29 DIAGNOSIS — H02831 Dermatochalasis of right upper eyelid: Secondary | ICD-10-CM | POA: Diagnosis not present

## 2014-11-29 DIAGNOSIS — H02834 Dermatochalasis of left upper eyelid: Secondary | ICD-10-CM | POA: Diagnosis not present

## 2014-11-29 DIAGNOSIS — H04123 Dry eye syndrome of bilateral lacrimal glands: Secondary | ICD-10-CM | POA: Diagnosis not present

## 2014-12-21 ENCOUNTER — Other Ambulatory Visit: Payer: Self-pay | Admitting: Cardiology

## 2014-12-24 ENCOUNTER — Other Ambulatory Visit: Payer: Self-pay | Admitting: Cardiology

## 2014-12-31 ENCOUNTER — Other Ambulatory Visit: Payer: Self-pay | Admitting: Cardiology

## 2014-12-31 DIAGNOSIS — I5022 Chronic systolic (congestive) heart failure: Secondary | ICD-10-CM

## 2015-01-15 DIAGNOSIS — Z79899 Other long term (current) drug therapy: Secondary | ICD-10-CM | POA: Diagnosis not present

## 2015-01-15 DIAGNOSIS — M810 Age-related osteoporosis without current pathological fracture: Secondary | ICD-10-CM | POA: Diagnosis not present

## 2015-01-15 DIAGNOSIS — I129 Hypertensive chronic kidney disease with stage 1 through stage 4 chronic kidney disease, or unspecified chronic kidney disease: Secondary | ICD-10-CM | POA: Diagnosis not present

## 2015-01-15 DIAGNOSIS — N183 Chronic kidney disease, stage 3 (moderate): Secondary | ICD-10-CM | POA: Diagnosis not present

## 2015-01-15 DIAGNOSIS — E049 Nontoxic goiter, unspecified: Secondary | ICD-10-CM | POA: Diagnosis not present

## 2015-01-15 DIAGNOSIS — Z0000 Encounter for general adult medical examination without abnormal findings: Secondary | ICD-10-CM | POA: Diagnosis not present

## 2015-01-15 DIAGNOSIS — Z1389 Encounter for screening for other disorder: Secondary | ICD-10-CM | POA: Diagnosis not present

## 2015-01-30 DIAGNOSIS — I129 Hypertensive chronic kidney disease with stage 1 through stage 4 chronic kidney disease, or unspecified chronic kidney disease: Secondary | ICD-10-CM | POA: Diagnosis not present

## 2015-01-30 DIAGNOSIS — Z79899 Other long term (current) drug therapy: Secondary | ICD-10-CM | POA: Diagnosis not present

## 2015-01-30 DIAGNOSIS — N183 Chronic kidney disease, stage 3 (moderate): Secondary | ICD-10-CM | POA: Diagnosis not present

## 2015-02-21 ENCOUNTER — Other Ambulatory Visit: Payer: Self-pay | Admitting: Cardiology

## 2015-02-22 DIAGNOSIS — I129 Hypertensive chronic kidney disease with stage 1 through stage 4 chronic kidney disease, or unspecified chronic kidney disease: Secondary | ICD-10-CM | POA: Diagnosis not present

## 2015-02-22 DIAGNOSIS — N183 Chronic kidney disease, stage 3 (moderate): Secondary | ICD-10-CM | POA: Diagnosis not present

## 2015-03-25 ENCOUNTER — Other Ambulatory Visit (HOSPITAL_COMMUNITY): Payer: Self-pay | Admitting: Adult Health

## 2015-03-26 ENCOUNTER — Ambulatory Visit (HOSPITAL_COMMUNITY)
Admission: RE | Admit: 2015-03-26 | Discharge: 2015-03-26 | Disposition: A | Discharge status: Home / Self Care | Payer: Medicare Other | Source: Ambulatory Visit | Attending: Cardiology | Admitting: Cardiology

## 2015-03-26 VITALS — BP 198/76 | HR 79 | Wt 120.0 lb

## 2015-03-26 DIAGNOSIS — Z951 Presence of aortocoronary bypass graft: Secondary | ICD-10-CM | POA: Diagnosis not present

## 2015-03-26 DIAGNOSIS — I5032 Chronic diastolic (congestive) heart failure: Secondary | ICD-10-CM | POA: Insufficient documentation

## 2015-03-26 DIAGNOSIS — I2510 Atherosclerotic heart disease of native coronary artery without angina pectoris: Secondary | ICD-10-CM | POA: Insufficient documentation

## 2015-03-26 DIAGNOSIS — K219 Gastro-esophageal reflux disease without esophagitis: Secondary | ICD-10-CM | POA: Diagnosis not present

## 2015-03-26 DIAGNOSIS — Z7982 Long term (current) use of aspirin: Secondary | ICD-10-CM | POA: Insufficient documentation

## 2015-03-26 DIAGNOSIS — Z953 Presence of xenogenic heart valve: Secondary | ICD-10-CM | POA: Insufficient documentation

## 2015-03-26 DIAGNOSIS — E785 Hyperlipidemia, unspecified: Secondary | ICD-10-CM | POA: Diagnosis not present

## 2015-03-26 DIAGNOSIS — I10 Essential (primary) hypertension: Secondary | ICD-10-CM | POA: Insufficient documentation

## 2015-03-26 DIAGNOSIS — Z79899 Other long term (current) drug therapy: Secondary | ICD-10-CM | POA: Insufficient documentation

## 2015-03-26 LAB — BASIC METABOLIC PANEL
ANION GAP: 9 (ref 5–15)
BUN: 29 mg/dL — ABNORMAL HIGH (ref 6–20)
CHLORIDE: 102 mmol/L (ref 101–111)
CO2: 26 mmol/L (ref 22–32)
CREATININE: 1.86 mg/dL — AB (ref 0.44–1.00)
Calcium: 9.7 mg/dL (ref 8.9–10.3)
GFR calc non Af Amer: 22 mL/min — ABNORMAL LOW (ref >60)
GFR, EST AFRICAN AMERICAN: 26 mL/min — AB (ref >60)
Glucose, Bld: 113 mg/dL — ABNORMAL HIGH (ref 65–99)
Potassium: 4.3 mmol/L (ref 3.5–5.1)
SODIUM: 137 mmol/L (ref 135–145)

## 2015-03-26 MED ORDER — HYDRALAZINE HCL 50 MG PO TABS: 75.0000 mg | ORAL_TABLET | Freq: Three times a day (TID) | ORAL | Status: DC

## 2015-03-26 MED ORDER — HYDRALAZINE HCL 25 MG PO TABS: 75.0000 mg | ORAL_TABLET | Freq: Three times a day (TID) | ORAL | Status: DC

## 2015-03-26 NOTE — Progress Notes (Signed)
Patient ID: Victoria Banks, female   DOB: [birth year], 81 y.o.   MRN: [id number] PCP: Dr. Felipa Banks  81 yo with history of bioprosthetic AVR, CAD, and chronic diastolic.  Admitted to [facility] with acute on chronic diastolic CHF on 00/34/91.  She was diuresed with IV Lasix and developed AKI.    She returns for follow up. Last visit nifedipine XL and hydralazine increased. Overall feeling good. Denies SOB/Orthopnea. Has no problem on flat ground, doesn't try stairs due to joint issues. SBP at home 160s. Weight at home 117-18 pounds. Exercising 3 times a week on stationary bike. PCP decreased Losartan to 50 mg daily and Lasix to 80 mg alternating with 40 mg daily after creatinine noted to rise from 1.65=>1.81. Denies any headaches, changes in vision, dizziness/lightheadedness, or nosebleeds. Has leg cramps at night. Does have a chronic dry cough.  Labs (10/15): K 4.4, creatinine 1.9, HCT 33.8 Labs (11/15): K 4.4, creatinine 1.47, BNP 1863 Labs (2/16) K 4.4, creatinine 1.65 Labs (02/22/15) K 4.2, creatinine 1.81 (by Dr Victoria Banks her PCP)  PMH: 1. HTN: Clonidine caused a dry mouth. BB caused serious cough 2. Hyperlipidemia 3. GERD 4. Aortic stenosis: s/p bioprosthetic aortic valve replacement in 2/11.  5. CAD: s/p SVG-OM in 2/11 with AVR.  6. Tremor 7. Multinodular goiter 8. Chronic diastolic CHF: Echo (79/15) with EF 55-60%, mild LVH, moderate diastolic dysfunction, normal bioprosthetic aortic valve, mild MR, PA systolic pressure 37 mmHg.   SH: Nonsmoker, lives at [facility] living, daughter is in town.   FH: No premature CAD.  ROS: All systems reviewed and negative except as per HPI.   Current Outpatient Prescriptions  Medication Sig Dispense Refill  . aspirin 81 MG tablet Take 81 mg by mouth daily.    . calcium citrate-vitamin D (CALCIUM CITRATE +) 315-200 MG-UNIT per tablet Take 1 tablet by mouth 2 (two) times daily.    . furosemide (LASIX) 80 MG tablet Take 1  tab every other day alternating with 1/2 tab every other day    . hydrALAZINE (APRESOLINE) 50 MG tablet Take 1.5 tablets (75 mg total) by mouth 3 (three) times daily. 135 tablet 3  . ipratropium (ATROVENT) 0.03 % nasal spray Place 2 sprays into both nostrils at bedtime as needed for rhinitis.     Marland Kitchen ketotifen (THERA TEARS ALLERGY) 0.025 % ophthalmic solution Place 1 drop into both eyes as needed (for dry eyes).    . LORazepam (ATIVAN) 0.5 MG tablet Take 0.5 mg by mouth at bedtime.     Marland Kitchen losartan (COZAAR) 100 MG tablet Take 50 mg by mouth daily.    Marland Kitchen MELATONIN PO Take 1 tablet by mouth at bedtime.     . Multiple Vitamins-Minerals (CENTRUM SILVER ULTRA WOMENS PO) Take 1 tablet by mouth daily.    Marland Kitchen NIFEdipine (PROCARDIA XL/ADALAT-CC) 60 MG 24 hr tablet TAKE (2) TABLETS DAILY. 60 tablet 6  . potassium chloride SA (K-DUR,KLOR-CON) 20 MEQ tablet Take 1 tablet (20 mEq total) by mouth 2 (two) times daily. 60 tablet 6  . pravastatin (PRAVACHOL) 40 MG tablet TAKE 1 TABLET IN THE P.M. 30 tablet 6   No current facility-administered medications for this encounter.   BP 198/76 mmHg  Pulse 79  Wt 120 lb (54.432 kg)  SpO2 94% General: NAD Neck: JVD not elevated, Bounding carotid pulse. No thyromegaly or thyroid nodule.  Lungs: Clear to auscultation bilaterally with normal respiratory effort. CV: Nondisplaced PMI.  Heart regular S1/S2, no S3/S4, 2/6 early  SEM RUSB that radiates to carotids.  No edema. Normal pedal pulses.  Abdomen: Soft, nontender, no hepatosplenomegaly, no distention.  Skin: Intact without lesions or rashes.  Neurologic: Alert and oriented x 3.  Psych: Normal affect. Extremities: No clubbing or cyanosis.  HEENT: Normal.   Assessment/Plan: 1. Chronic diastolic CHF:  Volume status stable, NYHA class II.  - Lasix decreased to alternating doses of 80 mg and 40 mg daily by PCP.  Think it is reasonable to continue this regimen. 2. HTN: SBP very high in clinic today at 198.  Reviewed home  SBPs and seem to be in the 160s.  - Continue Nifedipine 120 mg daily. - Increased Hydralazine to 75 mg tid.  - She has been unable to tolerate beta blockers and clonidine.  Spironolactone not ideal with elevated creatinine.  - Will consider increasing Losartan back up if HTN not improved. - She will check BP daily, we will call her in 2 wks for readings.  3. CKD:  Repeat BMET today.   4. Bioprosthetic aortic valve: Valve looked ok on 10/15 echo. 5. CAD: s/p CABG with SVG-OM at time of AVR.  She is on ASA 81 and statin. No chest pain.   Victoria Banks 03/26/2015

## 2015-03-26 NOTE — Patient Instructions (Signed)
Increase Hydralazine to 75 mg (3 tabs) Three times a day   Labs today  We will call you in 2 weeks to see how your BP is doing  Your physician recommends that you schedule a follow-up appointment in: 2 months

## 2015-04-09 ENCOUNTER — Encounter (HOSPITAL_COMMUNITY): Payer: Medicare Other

## 2015-04-10 ENCOUNTER — Telehealth (HOSPITAL_COMMUNITY): Payer: Self-pay

## 2015-04-10 MED ORDER — LOSARTAN POTASSIUM 50 MG PO TABS: 50.0000 mg | ORAL_TABLET | Freq: Two times a day (BID) | ORAL | Status: DC

## 2015-04-10 NOTE — Telephone Encounter (Signed)
Increase losartan to 50 mg bid.  She will need BMET in 7 days.

## 2015-04-10 NOTE — Telephone Encounter (Signed)
Per Dr Aundra Dubin, advised patient to increase Losartan to 50mg  BID for continued high SBP at home of 160-180s.  Patient had been taking 50mg  once daily.  Aware and agreeable.  Will call our office to update [place] on her SBP in a few days.  Rx updated to preferred pharmacy electronically.  Renee Pain

## 2015-04-10 NOTE — Telephone Encounter (Signed)
Patient called to update [place] on home BP readings for last 2 weeks per Dr. Claris Gladden recommendation from her recent appointment with [facility]F [facility]. Patient states in am her BP readings are anywhere from 160-737 systolic. In the afternoon her BP is 106'Y systolic.  Per Dr. Claris Gladden note from her last appointment with [place]... 2. HTN: SBP very high in clinic today at 198. Reviewed home SBPs and seem to be in the 160s.  - Continue Nifedipine 120 mg daily. - Increased Hydralazine to 75 mg tid.  - She has been unable to tolerate beta blockers and clonidine. Spironolactone not ideal with elevated creatinine.  - Will consider increasing Losartan back up if HTN not improved. - She will check BP daily, we will call her in 2 wks for readings.   Will forward to MD for further guidance/instruction for patient.  Renee Pain

## 2015-05-12 DIAGNOSIS — Z23 Encounter for immunization: Secondary | ICD-10-CM | POA: Diagnosis not present

## 2015-05-14 DIAGNOSIS — H5213 Myopia, bilateral: Secondary | ICD-10-CM | POA: Diagnosis not present

## 2015-05-14 DIAGNOSIS — H3531 Nonexudative age-related macular degeneration: Secondary | ICD-10-CM | POA: Diagnosis not present

## 2015-05-14 DIAGNOSIS — H35372 Puckering of macula, left eye: Secondary | ICD-10-CM | POA: Diagnosis not present

## 2015-05-23 ENCOUNTER — Other Ambulatory Visit: Payer: Self-pay | Admitting: Cardiology

## 2015-06-04 ENCOUNTER — Ambulatory Visit (HOSPITAL_COMMUNITY)
Admission: RE | Admit: 2015-06-04 | Discharge: 2015-06-04 | Disposition: A | Discharge status: Home / Self Care | Payer: Medicare Other | Source: Ambulatory Visit | Attending: Cardiology | Admitting: Cardiology

## 2015-06-04 VITALS — BP 188/64 | HR 80 | Wt 121.8 lb

## 2015-06-04 DIAGNOSIS — R059 Cough, unspecified: Secondary | ICD-10-CM

## 2015-06-04 DIAGNOSIS — I2510 Atherosclerotic heart disease of native coronary artery without angina pectoris: Secondary | ICD-10-CM

## 2015-06-04 DIAGNOSIS — N184 Chronic kidney disease, stage 4 (severe): Secondary | ICD-10-CM

## 2015-06-04 DIAGNOSIS — I5032 Chronic diastolic (congestive) heart failure: Secondary | ICD-10-CM

## 2015-06-04 DIAGNOSIS — Z951 Presence of aortocoronary bypass graft: Secondary | ICD-10-CM | POA: Diagnosis not present

## 2015-06-04 DIAGNOSIS — I509 Heart failure, unspecified: Secondary | ICD-10-CM | POA: Insufficient documentation

## 2015-06-04 DIAGNOSIS — R05 Cough: Secondary | ICD-10-CM

## 2015-06-04 DIAGNOSIS — J449 Chronic obstructive pulmonary disease, unspecified: Secondary | ICD-10-CM | POA: Insufficient documentation

## 2015-06-04 DIAGNOSIS — I359 Nonrheumatic aortic valve disorder, unspecified: Secondary | ICD-10-CM

## 2015-06-04 LAB — BASIC METABOLIC PANEL
Anion gap: 9 (ref 5–15)
BUN: 26 mg/dL — ABNORMAL HIGH (ref 6–20)
CALCIUM: 9.5 mg/dL (ref 8.9–10.3)
CO2: 27 mmol/L (ref 22–32)
Chloride: 101 mmol/L (ref 101–111)
Creatinine, Ser: 2 mg/dL — ABNORMAL HIGH (ref 0.44–1.00)
GFR, EST AFRICAN AMERICAN: 24 mL/min — AB (ref >60)
GFR, EST NON AFRICAN AMERICAN: 21 mL/min — AB (ref >60)
GLUCOSE: 111 mg/dL — AB (ref 65–99)
POTASSIUM: 4 mmol/L (ref 3.5–5.1)
Sodium: 137 mmol/L (ref 135–145)

## 2015-06-04 LAB — LIPID PANEL
CHOLESTEROL: 201 mg/dL — AB (ref 0–200)
HDL: 56 mg/dL (ref >40)
LDL CALC: 105 mg/dL — AB (ref 0–99)
TRIGLYCERIDES: 201 mg/dL — AB (ref <150)
Total CHOL/HDL Ratio: 3.6 RATIO
VLDL: 40 mg/dL (ref 0–40)

## 2015-06-04 LAB — BRAIN NATRIURETIC PEPTIDE: B NATRIURETIC PEPTIDE 5: 925.3 pg/mL — AB (ref 0.0–100.0)

## 2015-06-04 MED ORDER — OMEPRAZOLE 20 MG PO CPDR: 20.0000 mg | DELAYED_RELEASE_CAPSULE | Freq: Every day | ORAL | Status: DC

## 2015-06-04 MED ORDER — SPIRONOLACTONE 25 MG PO TABS: 12.5000 mg | ORAL_TABLET | Freq: Every day | ORAL | Status: DC

## 2015-06-04 MED ORDER — POTASSIUM CHLORIDE CRYS ER 20 MEQ PO TBCR: 20.0000 meq | EXTENDED_RELEASE_TABLET | Freq: Every day | ORAL | Status: DC

## 2015-06-04 MED ORDER — HYDRALAZINE HCL 100 MG PO TABS: 100.0000 mg | ORAL_TABLET | Freq: Three times a day (TID) | ORAL | Status: DC

## 2015-06-04 NOTE — Patient Instructions (Signed)
Increase Hydrazine to 100 mg Three times a day, you can take 2 of your 50 mg tablets until you run out, THEN we have sent you in a prescription for 100 mg tablets  Decrease Potassium to 20 meq DAILY  Start Spironolactone 12.5 mg (1/2 tab) daily  Start Prilosec 20 mg (over the counter) daily for 2 week to see if this helps with your cough, if it doesn't you can stop after 2 weeks  Labs today  Chest x-ray  Labs in 10 days, WHEN YOU COME FOR LABS PLEASE BRING A LIST OF YOUR BP READINGS FOR [place] TO REVIEW  Your physician recommends that you schedule a follow-up appointment in: 6 weeks

## 2015-06-04 NOTE — Progress Notes (Signed)
Patient ID: Victoria Banks, female   DOB: August 15, 1921, 81 y.o.   MRN: [id number] PCP: Dr. Felipa Eth  81 yo with history of bioprosthetic AVR, CAD, and chronic diastolic.  Admitted to [facility] with acute on chronic diastolic CHF on 97/58/83.  She was diuresed with IV Lasix and developed AKI.    She returns for follow up. Overall feeling good. Denies SOB/Orthopnea. Has no problem on flat ground, doesn't try stairs due to joint issues. Uses exercise bike and Nustep.  Able to do all ADLs. SBP at home 160s-180s still (she checks it daily). Taking all her BP meds.  Weight stable. She has had a chronic cough for a month, gets a "tickle" in her throat.  Some reflux but thinks it's controlled on ranitidine.   Labs (10/15): K 4.4, creatinine 1.9, HCT 33.8, LDL 145 Labs (11/15): K 4.4, creatinine 1.47, BNP 1863 Labs (2/16) K 4.4, creatinine 1.65 Labs (02/22/15) K 4.2, creatinine 1.81 (by Dr Felipa Eth her PCP) Labs (6/16): K 4.3, creatinine 1.86  PMH: 1. HTN: Clonidine caused a dry mouth. BB caused serious cough 2. Hyperlipidemia 3. GERD 4. Aortic stenosis: s/p bioprosthetic aortic valve replacement in 2/11.  5. CAD: s/p SVG-OM in 2/11 with AVR.  6. Tremor 7. Multinodular goiter 8. Chronic diastolic CHF: Echo (25/49) with EF 55-60%, mild LVH, moderate diastolic dysfunction, normal bioprosthetic aortic valve, mild MR, PA systolic pressure 37 mmHg.   SH: Nonsmoker, lives at [facility], daughter is in town.   FH: No premature CAD.  ROS: All systems reviewed and negative except as per HPI.   Current Outpatient Prescriptions  Medication Sig Dispense Refill  . aspirin 81 MG tablet Take 81 mg by mouth daily.    . calcium citrate-vitamin D (CALCIUM CITRATE +) 315-200 MG-UNIT per tablet Take 1 tablet by mouth 2 (two) times daily.    . furosemide (LASIX) 80 MG tablet Take 1 tab every other day alternating with 1/2 tab every other day    . hydrALAZINE (APRESOLINE) 100 MG  tablet Take 1 tablet (100 mg total) by mouth 3 (three) times daily. 90 tablet 3  . ipratropium (ATROVENT) 0.03 % nasal spray Place 2 sprays into both nostrils at bedtime as needed for rhinitis.     Marland Kitchen ketotifen (THERA TEARS ALLERGY) 0.025 % ophthalmic solution Place 1 drop into both eyes as needed (for dry eyes).    . LORazepam (ATIVAN) 0.5 MG tablet Take 0.5 mg by mouth at bedtime.     Marland Kitchen losartan (COZAAR) 50 MG tablet Take 1 tablet (50 mg total) by mouth 2 (two) times daily. 60 tablet 6  . MELATONIN PO Take 1 tablet by mouth at bedtime.     . Multiple Vitamins-Minerals (CENTRUM SILVER ULTRA WOMENS PO) Take 1 tablet by mouth daily.    Marland Kitchen NIFEdipine (PROCARDIA XL/ADALAT-CC) 60 MG 24 hr tablet TAKE (2) TABLETS DAILY. 60 tablet 6  . potassium chloride SA (K-DUR,KLOR-CON) 20 MEQ tablet Take 1 tablet (20 mEq total) by mouth daily. 60 tablet 0  . pravastatin (PRAVACHOL) 40 MG tablet TAKE 1 TABLET IN THE P.M. 30 tablet 6  . omeprazole (PRILOSEC) 20 MG capsule Take 1 capsule (20 mg total) by mouth daily.    Marland Kitchen spironolactone (ALDACTONE) 25 MG tablet Take 0.5 tablets (12.5 mg total) by mouth daily. 15 tablet 3   No current facility-administered medications for this encounter.   BP 188/64 mmHg  Pulse 80  Wt 121 lb 12.8 oz (55.248 kg)  SpO2  94% General: NAD Neck: JVD not elevated, Bounding carotid pulse. No thyromegaly or thyroid nodule.  Lungs: Occasional crackles at bases bilaterally.  CV: Nondisplaced PMI.  Heart regular S1/S2, no S3/S4, 2/6 early SEM RUSB that radiates to carotids.  Trace ankle edema. Normal pedal pulses.  Abdomen: Soft, nontender, no hepatosplenomegaly, no distention.  Skin: Intact without lesions or rashes.  Neurologic: Alert and oriented x 3.  Psych: Normal affect. Extremities: No clubbing or cyanosis.  HEENT: Normal.   Assessment/Plan: 1. Chronic diastolic CHF:  Volume status stable, NYHA class II.  She looks euvolemic on exam and weight is stable, continue current Lasix.  BMET/BNP today.  2. HTN: BP remains high at home and here today.  - Continue Nifedipine 120 mg daily.  Continue losartan 50 mg bid.  - Increase Hydralazine to 100 mg tid.  - She has been unable to tolerate beta blockers and clonidine.  Given stable creatinine recently, I will add spironolactone 12.5 mg daily and decrease KCl to 20 mEq once daily.  She will need BMET in 10 days. - She will check BP daily and will call in 2 wks with readings.  3. CKD:  BMET today.   4. Bioprosthetic aortic valve: Valve looked ok on 10/15 echo. 5. CAD: s/p CABG with SVG-OM at time of AVR.  She is on ASA 81 and statin. No chest pain. Check lipids today.   Loralie Champagne 06/04/2015

## 2015-06-11 ENCOUNTER — Other Ambulatory Visit (HOSPITAL_COMMUNITY): Payer: Medicare Other

## 2015-06-12 ENCOUNTER — Ambulatory Visit (HOSPITAL_COMMUNITY)
Admission: RE | Admit: 2015-06-12 | Discharge: 2015-06-12 | Disposition: A | Discharge status: Home / Self Care | Payer: Medicare Other | Source: Ambulatory Visit | Attending: Internal Medicine | Admitting: Internal Medicine

## 2015-06-12 DIAGNOSIS — I5032 Chronic diastolic (congestive) heart failure: Secondary | ICD-10-CM

## 2015-06-12 DIAGNOSIS — I5022 Chronic systolic (congestive) heart failure: Secondary | ICD-10-CM | POA: Diagnosis not present

## 2015-06-12 LAB — BASIC METABOLIC PANEL
ANION GAP: 9 (ref 5–15)
BUN: 24 mg/dL — ABNORMAL HIGH (ref 6–20)
CO2: 21 mmol/L — AB (ref 22–32)
Calcium: 8.6 mg/dL — ABNORMAL LOW (ref 8.9–10.3)
Chloride: 100 mmol/L — ABNORMAL LOW (ref 101–111)
Creatinine, Ser: 1.9 mg/dL — ABNORMAL HIGH (ref 0.44–1.00)
GFR calc Af Amer: 25 mL/min — ABNORMAL LOW (ref >60)
GFR, EST NON AFRICAN AMERICAN: 22 mL/min — AB (ref >60)
GLUCOSE: 147 mg/dL — AB (ref 65–99)
POTASSIUM: 4.5 mmol/L (ref 3.5–5.1)
Sodium: 130 mmol/L — ABNORMAL LOW (ref 135–145)

## 2015-06-25 ENCOUNTER — Other Ambulatory Visit: Payer: Self-pay | Admitting: Cardiology

## 2015-07-01 DIAGNOSIS — Z79899 Other long term (current) drug therapy: Secondary | ICD-10-CM | POA: Diagnosis not present

## 2015-07-01 DIAGNOSIS — I350 Nonrheumatic aortic (valve) stenosis: Secondary | ICD-10-CM | POA: Diagnosis not present

## 2015-07-01 DIAGNOSIS — I129 Hypertensive chronic kidney disease with stage 1 through stage 4 chronic kidney disease, or unspecified chronic kidney disease: Secondary | ICD-10-CM | POA: Diagnosis not present

## 2015-07-01 DIAGNOSIS — N183 Chronic kidney disease, stage 3 (moderate): Secondary | ICD-10-CM | POA: Diagnosis not present

## 2015-07-01 DIAGNOSIS — F419 Anxiety disorder, unspecified: Secondary | ICD-10-CM | POA: Diagnosis not present

## 2015-08-08 DIAGNOSIS — H353211 Exudative age-related macular degeneration, right eye, with active choroidal neovascularization: Secondary | ICD-10-CM | POA: Diagnosis not present

## 2015-08-09 DIAGNOSIS — I129 Hypertensive chronic kidney disease with stage 1 through stage 4 chronic kidney disease, or unspecified chronic kidney disease: Secondary | ICD-10-CM | POA: Diagnosis not present

## 2015-08-09 DIAGNOSIS — N184 Chronic kidney disease, stage 4 (severe): Secondary | ICD-10-CM | POA: Diagnosis not present

## 2015-09-05 ENCOUNTER — Other Ambulatory Visit: Payer: Self-pay | Admitting: Geriatric Medicine

## 2015-09-05 DIAGNOSIS — Z79899 Other long term (current) drug therapy: Secondary | ICD-10-CM | POA: Diagnosis not present

## 2015-09-05 DIAGNOSIS — I5030 Unspecified diastolic (congestive) heart failure: Secondary | ICD-10-CM | POA: Diagnosis not present

## 2015-09-05 DIAGNOSIS — N184 Chronic kidney disease, stage 4 (severe): Secondary | ICD-10-CM

## 2015-09-05 DIAGNOSIS — I129 Hypertensive chronic kidney disease with stage 1 through stage 4 chronic kidney disease, or unspecified chronic kidney disease: Secondary | ICD-10-CM | POA: Diagnosis not present

## 2015-09-11 ENCOUNTER — Encounter (HOSPITAL_COMMUNITY): Payer: Self-pay | Admitting: Emergency Medicine

## 2015-09-11 ENCOUNTER — Inpatient Hospital Stay (HOSPITAL_COMMUNITY)
Admission: EM | Admit: 2015-09-11 | Discharge: 2015-09-19 | DRG: 291 | Disposition: A | Discharge status: Home / Self Care | Payer: Medicare Other | Attending: Internal Medicine | Admitting: Internal Medicine

## 2015-09-11 ENCOUNTER — Emergency Department (HOSPITAL_COMMUNITY): Payer: Medicare Other

## 2015-09-11 DIAGNOSIS — E785 Hyperlipidemia, unspecified: Secondary | ICD-10-CM | POA: Diagnosis present

## 2015-09-11 DIAGNOSIS — I10 Essential (primary) hypertension: Secondary | ICD-10-CM | POA: Diagnosis not present

## 2015-09-11 DIAGNOSIS — Z952 Presence of prosthetic heart valve: Secondary | ICD-10-CM | POA: Insufficient documentation

## 2015-09-11 DIAGNOSIS — R069 Unspecified abnormalities of breathing: Secondary | ICD-10-CM | POA: Diagnosis not present

## 2015-09-11 DIAGNOSIS — I509 Heart failure, unspecified: Secondary | ICD-10-CM | POA: Diagnosis not present

## 2015-09-11 DIAGNOSIS — I359 Nonrheumatic aortic valve disorder, unspecified: Secondary | ICD-10-CM | POA: Diagnosis not present

## 2015-09-11 DIAGNOSIS — Z951 Presence of aortocoronary bypass graft: Secondary | ICD-10-CM | POA: Diagnosis not present

## 2015-09-11 DIAGNOSIS — I2510 Atherosclerotic heart disease of native coronary artery without angina pectoris: Secondary | ICD-10-CM | POA: Diagnosis present

## 2015-09-11 DIAGNOSIS — I5033 Acute on chronic diastolic (congestive) heart failure: Secondary | ICD-10-CM | POA: Diagnosis not present

## 2015-09-11 DIAGNOSIS — Z953 Presence of xenogenic heart valve: Secondary | ICD-10-CM | POA: Diagnosis not present

## 2015-09-11 DIAGNOSIS — R0600 Dyspnea, unspecified: Secondary | ICD-10-CM | POA: Diagnosis not present

## 2015-09-11 DIAGNOSIS — E876 Hypokalemia: Secondary | ICD-10-CM | POA: Diagnosis present

## 2015-09-11 DIAGNOSIS — N179 Acute kidney failure, unspecified: Secondary | ICD-10-CM | POA: Diagnosis not present

## 2015-09-11 DIAGNOSIS — J9601 Acute respiratory failure with hypoxia: Secondary | ICD-10-CM | POA: Diagnosis not present

## 2015-09-11 DIAGNOSIS — I130 Hypertensive heart and chronic kidney disease with heart failure and stage 1 through stage 4 chronic kidney disease, or unspecified chronic kidney disease: Principal | ICD-10-CM | POA: Diagnosis present

## 2015-09-11 DIAGNOSIS — I25810 Atherosclerosis of coronary artery bypass graft(s) without angina pectoris: Secondary | ICD-10-CM | POA: Insufficient documentation

## 2015-09-11 DIAGNOSIS — K219 Gastro-esophageal reflux disease without esophagitis: Secondary | ICD-10-CM | POA: Diagnosis present

## 2015-09-11 DIAGNOSIS — I350 Nonrheumatic aortic (valve) stenosis: Secondary | ICD-10-CM | POA: Diagnosis present

## 2015-09-11 DIAGNOSIS — Z888 Allergy status to other drugs, medicaments and biological substances status: Secondary | ICD-10-CM

## 2015-09-11 DIAGNOSIS — R32 Unspecified urinary incontinence: Secondary | ICD-10-CM | POA: Diagnosis present

## 2015-09-11 DIAGNOSIS — R0602 Shortness of breath: Secondary | ICD-10-CM | POA: Diagnosis not present

## 2015-09-11 DIAGNOSIS — N184 Chronic kidney disease, stage 4 (severe): Secondary | ICD-10-CM | POA: Diagnosis present

## 2015-09-11 DIAGNOSIS — Z7982 Long term (current) use of aspirin: Secondary | ICD-10-CM

## 2015-09-11 DIAGNOSIS — M810 Age-related osteoporosis without current pathological fracture: Secondary | ICD-10-CM | POA: Diagnosis present

## 2015-09-11 DIAGNOSIS — N289 Disorder of kidney and ureter, unspecified: Secondary | ICD-10-CM | POA: Diagnosis not present

## 2015-09-11 DIAGNOSIS — Z882 Allergy status to sulfonamides status: Secondary | ICD-10-CM

## 2015-09-11 DIAGNOSIS — Z79899 Other long term (current) drug therapy: Secondary | ICD-10-CM | POA: Diagnosis not present

## 2015-09-11 DIAGNOSIS — Z954 Presence of other heart-valve replacement: Secondary | ICD-10-CM | POA: Diagnosis not present

## 2015-09-11 HISTORY — DX: Chronic diastolic (congestive) heart failure: I50.32

## 2015-09-11 LAB — I-STAT TROPONIN, ED: Troponin i, poc: 0.06 ng/mL (ref 0.00–0.08)

## 2015-09-11 LAB — COMPREHENSIVE METABOLIC PANEL
ALT: 16 U/L (ref 14–54)
AST: 23 U/L (ref 15–41)
Albumin: 3.2 g/dL — ABNORMAL LOW (ref 3.5–5.0)
Alkaline Phosphatase: 51 U/L (ref 38–126)
Anion gap: 11 (ref 5–15)
BILIRUBIN TOTAL: 0.5 mg/dL (ref 0.3–1.2)
BUN: 25 mg/dL — AB (ref 6–20)
CALCIUM: 9 mg/dL (ref 8.9–10.3)
CO2: 23 mmol/L (ref 22–32)
CREATININE: 2.09 mg/dL — AB (ref 0.44–1.00)
Chloride: 102 mmol/L (ref 101–111)
GFR, EST AFRICAN AMERICAN: 23 mL/min — AB (ref >60)
GFR, EST NON AFRICAN AMERICAN: 19 mL/min — AB (ref >60)
GLUCOSE: 125 mg/dL — AB (ref 65–99)
POTASSIUM: 4.9 mmol/L (ref 3.5–5.1)
Sodium: 136 mmol/L (ref 135–145)
TOTAL PROTEIN: 6.7 g/dL (ref 6.5–8.1)

## 2015-09-11 LAB — CBC WITH DIFFERENTIAL/PLATELET
BASOS ABS: 0 10*3/uL (ref 0.0–0.1)
Basophils Relative: 0 %
EOS PCT: 3 %
Eosinophils Absolute: 0.2 10*3/uL (ref 0.0–0.7)
HEMATOCRIT: 36 % (ref 36.0–46.0)
Hemoglobin: 11.7 g/dL — ABNORMAL LOW (ref 12.0–15.0)
LYMPHS PCT: 15 %
Lymphs Abs: 1.2 10*3/uL (ref 0.7–4.0)
MCH: 28.5 pg (ref 26.0–34.0)
MCHC: 32.5 g/dL (ref 30.0–36.0)
MCV: 87.8 fL (ref 78.0–100.0)
MONO ABS: 0.7 10*3/uL (ref 0.1–1.0)
MONOS PCT: 8 %
NEUTROS ABS: 6.2 10*3/uL (ref 1.7–7.7)
Neutrophils Relative %: 74 %
PLATELETS: 253 10*3/uL (ref 150–400)
RBC: 4.1 MIL/uL (ref 3.87–5.11)
RDW: 14.4 % (ref 11.5–15.5)
WBC: 8.3 10*3/uL (ref 4.0–10.5)

## 2015-09-11 LAB — I-STAT ARTERIAL BLOOD GAS, ED
Acid-Base Excess: 1 mmol/L (ref 0.0–2.0)
BICARBONATE: 24.6 meq/L — AB (ref 20.0–24.0)
O2 SAT: 90 %
PCO2 ART: 34.8 mmHg — AB (ref 35.0–45.0)
PH ART: 7.457 — AB (ref 7.350–7.450)
PO2 ART: 55 mmHg — AB (ref 80.0–100.0)
TCO2: 26 mmol/L (ref 0–100)

## 2015-09-11 LAB — BRAIN NATRIURETIC PEPTIDE: B Natriuretic Peptide: 1681 pg/mL — ABNORMAL HIGH (ref 0.0–100.0)

## 2015-09-11 MED ORDER — PANTOPRAZOLE SODIUM 40 MG PO TBEC
40.0000 mg | DELAYED_RELEASE_TABLET | Freq: Every day | ORAL | Status: DC
  Administered 2015-09-12 – 2015-09-19 (×8): 40 mg via ORAL
  Filled 2015-09-11 (×8): qty 1

## 2015-09-11 MED ORDER — ASPIRIN EC 81 MG PO TBEC
81.0000 mg | DELAYED_RELEASE_TABLET | Freq: Every day | ORAL | Status: DC
  Administered 2015-09-11 – 2015-09-19 (×9): 81 mg via ORAL
  Filled 2015-09-11 (×9): qty 1

## 2015-09-11 MED ORDER — FUROSEMIDE 10 MG/ML IJ SOLN
80.0000 mg | Freq: Two times a day (BID) | INTRAMUSCULAR | Status: DC
  Administered 2015-09-12: 80 mg via INTRAVENOUS
  Filled 2015-09-11: qty 8

## 2015-09-11 MED ORDER — ASPIRIN 81 MG PO TABS: 81.0000 mg | ORAL_TABLET | Freq: Every day | ORAL | Status: DC

## 2015-09-11 MED ORDER — SODIUM CHLORIDE 0.9 % IJ SOLN: 3.0000 mL | INTRAMUSCULAR | Status: DC | PRN

## 2015-09-11 MED ORDER — PRAVASTATIN SODIUM 40 MG PO TABS
40.0000 mg | ORAL_TABLET | Freq: Every day | ORAL | Status: DC
  Administered 2015-09-12 – 2015-09-18 (×7): 40 mg via ORAL
  Filled 2015-09-11 (×7): qty 1

## 2015-09-11 MED ORDER — SODIUM CHLORIDE 0.9 % IJ SOLN
3.0000 mL | Freq: Two times a day (BID) | INTRAMUSCULAR | Status: DC
  Administered 2015-09-11 – 2015-09-19 (×15): 3 mL via INTRAVENOUS

## 2015-09-11 MED ORDER — HYDRALAZINE HCL 50 MG PO TABS
100.0000 mg | ORAL_TABLET | Freq: Three times a day (TID) | ORAL | Status: DC
  Administered 2015-09-11: 100 mg via ORAL
  Filled 2015-09-11 (×3): qty 2

## 2015-09-11 MED ORDER — LORAZEPAM 0.5 MG PO TABS
0.5000 mg | ORAL_TABLET | Freq: Every day | ORAL | Status: DC
  Administered 2015-09-11 – 2015-09-18 (×8): 0.5 mg via ORAL
  Filled 2015-09-11 (×9): qty 1

## 2015-09-11 MED ORDER — FUROSEMIDE 10 MG/ML IJ SOLN
80.0000 mg | Freq: Once | INTRAMUSCULAR | Status: AC
  Administered 2015-09-11: 80 mg via INTRAVENOUS
  Filled 2015-09-11: qty 8

## 2015-09-11 MED ORDER — ENOXAPARIN SODIUM 30 MG/0.3ML ~~LOC~~ SOLN
30.0000 mg | SUBCUTANEOUS | Status: DC
  Administered 2015-09-11 – 2015-09-18 (×8): 30 mg via SUBCUTANEOUS
  Filled 2015-09-11 (×8): qty 0.3

## 2015-09-11 MED ORDER — NITROGLYCERIN 2 % TD OINT
1.0000 [in_us] | TOPICAL_OINTMENT | Freq: Four times a day (QID) | TRANSDERMAL | Status: DC
  Administered 2015-09-11 – 2015-09-12 (×3): 1 [in_us] via TOPICAL
  Filled 2015-09-11 (×3): qty 30
  Filled 2015-09-11: qty 1
  Filled 2015-09-11 (×2): qty 30

## 2015-09-11 MED ORDER — SODIUM CHLORIDE 0.9 % IV SOLN: 250.0000 mL | INTRAVENOUS | Status: DC | PRN

## 2015-09-11 MED ORDER — NITROGLYCERIN IN D5W 200-5 MCG/ML-% IV SOLN
0.0000 ug/min | Freq: Once | INTRAVENOUS | Status: AC
  Administered 2015-09-11: 100 ug/min via INTRAVENOUS
  Filled 2015-09-11: qty 250

## 2015-09-11 MED ORDER — ACETAMINOPHEN 325 MG PO TABS: 650.0000 mg | ORAL_TABLET | ORAL | Status: DC | PRN

## 2015-09-11 MED ORDER — NIFEDIPINE ER 60 MG PO TB24
120.0000 mg | ORAL_TABLET | Freq: Every day | ORAL | Status: DC
  Administered 2015-09-12: 120 mg via ORAL
  Filled 2015-09-11: qty 2

## 2015-09-11 MED ORDER — ONDANSETRON HCL 4 MG/2ML IJ SOLN: 4.0000 mg | Freq: Four times a day (QID) | INTRAMUSCULAR | Status: DC | PRN

## 2015-09-11 MED ORDER — KETOTIFEN FUMARATE 0.025 % OP SOLN: 1.0000 [drp] | OPHTHALMIC | Status: DC | PRN

## 2015-09-11 NOTE — ED Provider Notes (Signed)
I saw and evaluated the patient, reviewed the resident's note and I agree with the findings and plan.  Here with dyspnea. Recently dc Losartan.  Exam with hypoxia, bilateral crackles, LE edema. No focality in lungs. Likely CHF exacerbation, will tx appropriately.    Merrily Pew, MD 09/11/15 [phone]

## 2015-09-11 NOTE — H&P (Signed)
History and Physical  Victoria Banks [id number] DOB: March 07, 1921 DOA: 09/11/2015   PCP: Mathews Argyle, MD  Referring Physician: ED/ Dr. Kathi Simpers  Chief Complaint: dyspnea  HPI:  81 year old female with a history of hypertension, hyperlipidemia, coronary artery disease, bioprosthetic aortic valve and diastolic CHF presented with one-week history of worsening shortness of breath. The patient went to see her primary care provider on 09/05/15 at which time, the patient's furosemide was increased to 80 mg daily. The patient previously took alternating days between 40 and 80 mg. At that time, her losartan was also discontinued because of worsening creatinine. Unfortunately, the patient's dyspnea continued to worsened causing her to present to the emergency department. The patient also complains of increasing lower extremity edema and increasing abdominal girth over the past week. In addition, she has had symptoms of orthopnea having to sleep in a recliner for the past 3 nights. She denies any fevers, chills, chest pain, nausea, vomiting, diarrhea, abdominal pain, dysuria, hematuria. She has a nonproductive cough without hemoptysis. The patient is a resident at [facility].  She endorses compliance with all her medications, but she states that she has been a little liberal with her fluid intake.  In the emergency department, the patient was started on a nitroglycerin drip. Upon presentation she was noted to be hypoxemic with oxygen saturation of 83% on room air. The patient's shortness of breath improved with the nitroglycerin drip, although she required 5 L nasal cannula for oxygen saturation 92-93 percent. BMP revealed potassium 149, sodium 146, serum creatinine 2.09. CBC was essentially unremarkable. ABG showed 7.45/34/55/26 on room air. Chest x-ray showed pulmonary edema. BNP was 1681. EKG shows sinus rhythm without any concerning ST-T wave changes Assessment/Plan: Acute respiratory  failure with hypoxia -Secondary to CHF exacerbation -Wean for oxygen saturation greater than 92% Acute on chronic diastolic CHF -[id number] echocardiogram EF 55-60%, grade 2 DD -Repeat echocardiogram -Furosemide 80 mg IV twice a day--I have ordered a dose to be given stat in the emergency department -Discontinue nitroglycerin drip -Start nitroglycerin paste; continue hydralazine -Patient has intolerance to beta blockers -Daily weights -Strict I's and O's -Hold the patient's Aldactone in the setting of potassium trending up CKD stage IV -Baseline creatinine 1.6-1.8 -Monitor on diuresis -Discontinue losartan Hypertension  -Continue hydralazine  -Add nitroglycerin paste  -Continue nifedipine  Bioprosthetic aortic valve/corn artery disease with history of CABG -Echocardiogram to reassess  -Continue aspirin       Past Medical History  Diagnosis Date  . Allergic rhinitis   . Hypertension   . Fibrocystic breast disease   . GERD (gastroesophageal reflux disease)   . Aortic stenosis, severe 08/2009    s/p AVR w pericardial tissue valve 10/2009  . Osteoporosis 12/2010    Reclast given  . Multinodular goiter   . Coronary artery disease 10/2009    S/P SVG to OM   . CRD (chronic renal disease), stage III   . Positive for microalbuminuria     On tevetan  . Benign familial tremor   . Atelectasis     scarring at basis on CXR   History reviewed. No pertinent past surgical history. Social History:  reports that she has never smoked. She does not have any smokeless tobacco history on file. She reports that she does not drink alcohol or use illicit drugs.   History reviewed. No pertinent family history.reviewed   Allergies  Allergen Reactions  . Aceon [Perindopril] Other (See Comments)    Chest Pain  .  Beta Adrenergic Blockers Other (See Comments)    Serious cough both times she tried.  Edd Fabian [Risedronate Sodium] Other (See Comments)    Unknown  . Allegra [Fexofenadine]  Other (See Comments)    BACK PAIN  . Biaxin [Clarithromycin] Nausea Only  . Ciprofloxacin Nausea Only  . Crestor [Rosuvastatin] Itching  . Fosamax [Alendronate Sodium] Other (See Comments)    Muscle pain  . Lipitor [Atorvastatin] Itching  . Promethazine Hcl Other (See Comments)    Unknown  . Simvastatin Rash  . Sulfa Antibiotics Other (See Comments)    GI Upset  . Welchol [Colesevelam Hcl] Rash      Prior to Admission medications   Medication Sig Start Date End Date Taking? Authorizing Provider  aspirin 81 MG tablet Take 81 mg by mouth daily.    Historical Provider, MD  calcium citrate-vitamin D (CALCIUM CITRATE +) 315-200 MG-UNIT per tablet Take 1 tablet by mouth 2 (two) times daily.    Historical Provider, MD  furosemide (LASIX) 80 MG tablet Take 1 tab every other day alternating with 1/2 tab every other day    Historical Provider, MD  hydrALAZINE (APRESOLINE) 100 MG tablet Take 1 tablet (100 mg total) by mouth 3 (three) times daily. 06/04/15   Larey Dresser, MD  ipratropium (ATROVENT) 0.03 % nasal spray Place 2 sprays into both nostrils at bedtime as needed for rhinitis.     Historical Provider, MD  ketotifen (THERA TEARS ALLERGY) 0.025 % ophthalmic solution Place 1 drop into both eyes as needed (for dry eyes).    Historical Provider, MD  LORazepam (ATIVAN) 0.5 MG tablet Take 0.5 mg by mouth at bedtime.     Historical Provider, MD  losartan (COZAAR) 50 MG tablet Take 1 tablet (50 mg total) by mouth 2 (two) times daily. 04/10/15   Larey Dresser, MD  MELATONIN PO Take 1 tablet by mouth at bedtime.     Historical Provider, MD  Multiple Vitamins-Minerals (CENTRUM SILVER ULTRA WOMENS PO) Take 1 tablet by mouth daily.    Historical Provider, MD  NIFEdipine (PROCARDIA XL/ADALAT-CC) 60 MG 24 hr tablet TAKE (2) TABLETS DAILY. 01/01/15   Larey Dresser, MD  omeprazole (PRILOSEC) 20 MG capsule Take 1 capsule (20 mg total) by mouth daily. 06/04/15   Larey Dresser, MD  potassium chloride SA  (K-DUR,KLOR-CON) 20 MEQ tablet TAKE 1 TABLET TWICE DAILY. 06/25/15   Larey Dresser, MD  pravastatin (PRAVACHOL) 40 MG tablet TAKE 1 TABLET IN THE P.M. 02/27/15   Jolaine Artist, MD  spironolactone (ALDACTONE) 25 MG tablet Take 0.5 tablets (12.5 mg total) by mouth daily. 06/04/15   Larey Dresser, MD    Review of Systems:  Constitutional:  No weight loss, night sweats, Fevers, chills, fatigue.  Head&Eyes: No headache.  No vision loss.  No eye pain or scotoma ENT:  No Difficulty swallowing,Tooth/dental problems,Sore throat,  No ear ache, post nasal drip,  Cardio-vascular:  No chest pain, Orthopnea, PND, swelling in lower extremities,  dizziness, palpitations  GI:  No  abdominal pain, nausea, vomiting, diarrhea, loss of appetite, hematochezia, melena, heartburn, indigestion, Resp:  No coughing up of blood .No wheezing.No chest wall deformity  Skin:  no rash or lesions.  GU:  no dysuria, change in color of urine, no urgency or frequency. No flank pain.  Musculoskeletal:  No joint pain or swelling. No decreased range of motion. No back pain.  Psych:  No change in mood or affect. No depression or anxiety.  Neurologic: No headache, no dysesthesia, no focal weakness, no vision loss. No syncope  Physical Exam: Filed Vitals:   09/11/15 1815 09/11/15 1830 09/11/15 1845 09/11/15 1900  BP: 150/85 153/61 155/70 158/67  Pulse: 69 67 71 72  Temp:      TempSrc:      Resp: 28 28 18 24   SpO2: 91% 89% 89% 91%   General:  A&O x 3, NAD, nontoxic, pleasant/cooperative Head/Eye: No conjunctival hemorrhage, no icterus, [place]/AT, No nystagmus ENT:  No icterus,  No thrush, good dentition, no pharyngeal exudate Neck:  No masses, no lymphadenpathy, no bruits CV:  RRR, no rub, no gallop, no S3 Lung:  Bilateral crackles. No wheezing. Good air movement.  Abdomen: soft/NT, +BS, nondistended, no peritoneal signs Ext: No cyanosis, No rashes, No petechiae, No lymphangitis, 2+ LE edema Neuro: CNII-XII  intact, strength 4/5 in bilateral upper and lower extremities, no dysmetria  Labs on Admission:  Basic Metabolic Panel:  Recent Labs Lab 09/11/15 1715  NA 136  K 4.9  CL 102  CO2 23  GLUCOSE 125*  BUN 25*  CREATININE 2.09*  CALCIUM 9.0   Liver Function Tests:  Recent Labs Lab 09/11/15 1715  AST 23  ALT 16  ALKPHOS 51  BILITOT 0.5  PROT 6.7  ALBUMIN 3.2*   No results for input(s): LIPASE, AMYLASE in the last 168 hours. No results for input(s): AMMONIA in the last 168 hours. CBC:  Recent Labs Lab 09/11/15 1715  WBC 8.3  NEUTROABS 6.2  HGB 11.7*  HCT 36.0  MCV 87.8  PLT 253   Cardiac Enzymes: No results for input(s): CKTOTAL, CKMB, CKMBINDEX, TROPONINI in the last 168 hours. BNP: Invalid input(s): POCBNP CBG: No results for input(s): GLUCAP in the last 168 hours.  Radiological Exams on Admission: Dg Chest Portable 1 View  09/11/2015  CLINICAL DATA:  Shortness of breath. EXAM: PORTABLE CHEST 1 VIEW COMPARISON:  June 04, 2015 FINDINGS: There are bilateral pleural effusions with cardiomegaly and moderate interstitial edema. There is bibasilar airspace consolidation as well. There is extensive atherosclerotic change in aorta. Patient is status post coronary artery bypass grafting and aortic valve replacement. No bone lesions. No adenopathy appreciable. IMPRESSION: Congestive heart failure. Airspace consolidation in the bases may represent alveolar edema but also may represent superimposed pneumonia. Both congestive heart failure and pneumonia may present concurrently. Electronically Signed   By: Lowella Grip III M.D.   On: 09/11/2015 17:48    EKG: Independently reviewed. Sinus rhythm, no ST-T wave changes    Time spent:60 minutes Code Status:   FULL Family Communication:   Daughter updated at bedside   Victoria Bunyan, DO  [facility] Pager [phone]  If 7PM-7AM, please contact night-coverage [email or web address] Password [facility] 09/11/2015, 7:16  PM

## 2015-09-11 NOTE — ED Notes (Signed)
Attempted to call report

## 2015-09-11 NOTE — ED Notes (Signed)
Pt here from home with c/o sob  , sats 75 % on room air by ems , pt arrived on NRB , pt 83 % on room air in ED , Hx of chf

## 2015-09-11 NOTE — ED Notes (Signed)
Nitro drip discontinued per Dr.Tat's note; nitro paste applied to L chest

## 2015-09-11 NOTE — ED Notes (Signed)
Pt assisted from bedside commode to bed; sats 83% on room air. Pt placed on 4L, sats improving, 91% on 4L. Bilateral pitting edema noted. Pt denies chest pain

## 2015-09-11 NOTE — ED Provider Notes (Signed)
CSN: [id number]     Arrival date & time 09/11/15  1652 History   First MD Initiated Contact with Patient 09/11/15 1652     Chief Complaint  Patient presents with  . Shortness of Breath    Patient is a 81 y.o. female presenting with shortness of breath. The history is provided by the patient.  Shortness of Breath Severity:  Severe Onset quality:  Gradual Duration:  2 days Timing:  Constant Progression:  Worsening Chronicity:  New Relieved by:  Oxygen Worsened by:  Exertion Associated symptoms: cough   Associated symptoms: no abdominal pain, no chest pain, no fever, no headaches, no neck pain, no rash, no sore throat and no vomiting     Past Medical History  Diagnosis Date  . Allergic rhinitis   . Hypertension   . Fibrocystic breast disease   . GERD (gastroesophageal reflux disease)   . Aortic stenosis, severe 08/2009    s/p AVR w pericardial tissue valve 10/2009  . Osteoporosis 12/2010    Reclast given  . Multinodular goiter   . Coronary artery disease 10/2009    S/P SVG to OM   . CRD (chronic renal disease), stage III   . Positive for microalbuminuria     On tevetan  . Benign familial tremor   . Atelectasis     scarring at basis on CXR   History reviewed. No pertinent past surgical history. History reviewed. No pertinent family history. Social History  Substance Use Topics  . Smoking status: Never Smoker   . Smokeless tobacco: None  . Alcohol Use: No   OB History    No data available     Review of Systems  Constitutional: Negative for fever.  HENT: Negative for rhinorrhea and sore throat.   Eyes: Negative for visual disturbance.  Respiratory: Positive for cough and shortness of breath. Negative for chest tightness.   Cardiovascular: Positive for leg swelling. Negative for chest pain and palpitations.  Gastrointestinal: Negative for nausea, vomiting, abdominal pain and constipation.  Genitourinary: Negative for dysuria and hematuria.  Musculoskeletal:  Negative for back pain and neck pain.  Skin: Negative for rash.  Neurological: Negative for dizziness and headaches.  Psychiatric/Behavioral: Negative for confusion.  All other systems reviewed and are negative.     Allergies  Aceon; Beta adrenergic blockers; Actonel; Allegra; Biaxin; Ciprofloxacin; Crestor; Fosamax; Lipitor; Promethazine hcl; Simvastatin; Sulfa antibiotics; and Welchol  Home Medications   Prior to Admission medications   Medication Sig Start Date End Date Taking? Authorizing Provider  aspirin 81 MG tablet Take 81 mg by mouth daily.   Yes Historical Provider, MD  calcium citrate-vitamin D (CALCIUM CITRATE +) 315-200 MG-UNIT per tablet Take 1 tablet by mouth 2 (two) times daily.   Yes Historical Provider, MD  furosemide (LASIX) 80 MG tablet Take 1 tab every other day alternating with 1/2 tab every other day   Yes Historical Provider, MD  ipratropium (ATROVENT) 0.03 % nasal spray Place 2 sprays into both nostrils at bedtime as needed for rhinitis.    Yes Historical Provider, MD  ketotifen (THERA TEARS ALLERGY) 0.025 % ophthalmic solution Place 1 drop into both eyes as needed (for dry eyes).   Yes Historical Provider, MD  LORazepam (ATIVAN) 0.5 MG tablet Take 0.5 mg by mouth at bedtime.    Yes Historical Provider, MD  MELATONIN PO Take 1 tablet by mouth at bedtime.    Yes Historical Provider, MD  Multiple Vitamins-Minerals (CENTRUM SILVER ULTRA WOMENS PO) Take 1 tablet by  mouth daily.   Yes Historical Provider, MD  NIFEdipine (PROCARDIA XL/ADALAT-CC) 60 MG 24 hr tablet TAKE (2) TABLETS DAILY. Patient taking differently: TAKE 90 MG TWICE DAILY 01/01/15  Yes Larey Dresser, MD  potassium chloride SA (K-DUR,KLOR-CON) 20 MEQ tablet TAKE 1 TABLET TWICE DAILY. 06/25/15  Yes Larey Dresser, MD  pravastatin (PRAVACHOL) 40 MG tablet TAKE 1 TABLET IN THE P.M. 02/27/15  Yes Shaune Pascal Bensimhon, MD   BP 185/68 mmHg  Pulse 74  Temp(Src) 97.5 F (36.4 C) (Oral)  Resp 22  Ht 5\' 1"  (1.549  m)  Wt 52.345 kg  BMI 21.82 kg/m2  SpO2 93% Physical Exam  Constitutional: She is oriented to person, place, and time. She appears well-developed and well-nourished. No distress.  HENT:  Head: Normocephalic and atraumatic.  Mouth/Throat: Oropharynx is clear and moist.  Eyes: EOM are normal. Pupils are equal, round, and reactive to light.  Neck: Neck supple. No JVD present.  Cardiovascular: Normal rate, regular rhythm, normal heart sounds and intact distal pulses.  Exam reveals no gallop.   No murmur heard. Pulmonary/Chest: Accessory muscle usage present. Tachypnea noted. She has no wheezes. She has rales (bilateral, diffuse).  Abdominal: Soft. She exhibits no distension. There is no tenderness.  Musculoskeletal: Normal range of motion. She exhibits edema (BLE, pitting). She exhibits no tenderness.  Neurological: She is alert and oriented to person, place, and time. No cranial nerve deficit. She exhibits normal muscle tone.  Skin: Skin is warm and dry. No rash noted.  Psychiatric: Her behavior is normal.    ED Course  Procedures  None   Labs Review Labs Reviewed  CBC WITH DIFFERENTIAL/PLATELET - Abnormal; Notable for the following:    Hemoglobin 11.7 (*)    All other components within normal limits  COMPREHENSIVE METABOLIC PANEL - Abnormal; Notable for the following:    Glucose, Bld 125 (*)    BUN 25 (*)    Creatinine, Ser 2.09 (*)    Albumin 3.2 (*)    GFR calc non Af Amer 19 (*)    GFR calc Af Amer 23 (*)    All other components within normal limits  BRAIN NATRIURETIC PEPTIDE - Abnormal; Notable for the following:    B Natriuretic Peptide 1681.0 (*)    All other components within normal limits  I-STAT ARTERIAL BLOOD GAS, ED - Abnormal; Notable for the following:    pH, Arterial 7.457 (*)    pCO2 arterial 34.8 (*)    pO2, Arterial 55.0 (*)    Bicarbonate 24.6 (*)    All other components within normal limits  BASIC METABOLIC PANEL  I-STAT TROPOININ, ED    Imaging  Review Dg Chest Portable 1 View  09/11/2015  CLINICAL DATA:  Shortness of breath. EXAM: PORTABLE CHEST 1 VIEW COMPARISON:  June 04, 2015 FINDINGS: There are bilateral pleural effusions with cardiomegaly and moderate interstitial edema. There is bibasilar airspace consolidation as well. There is extensive atherosclerotic change in aorta. Patient is status post coronary artery bypass grafting and aortic valve replacement. No bone lesions. No adenopathy appreciable. IMPRESSION: Congestive heart failure. Airspace consolidation in the bases may represent alveolar edema but also may represent superimposed pneumonia. Both congestive heart failure and pneumonia may present concurrently. Electronically Signed   By: Lowella Grip III M.D.   On: 09/11/2015 17:48   I have personally reviewed and evaluated these images and lab results as part of my medical decision-making.   MDM   Final diagnoses:  Acute on chronic  diastolic CHF (congestive heart failure) ([place])    81 year old female with a history of heart failure with no prior oxygen requirement who presents with progressively worsening shortness of breath worse with exertion since yesterday. She is hypoxic requiring 4 L nasal cannula to maintain oxygen saturation greater than 90%. He has bilateral lower and upper crackles. Chest x-ray concerning for pulmonary edema secondary to CHF exacerbation. Patient is hypertensive and nitroglycerin drip began at 100 mcg/m and titrated to effect. And proved in the ED. Nitroglycerin drip was the escalated. No positive pressure ventilation required. No fever, normal white count. No EKG changes to suggest cardiac ischemia at this time. Doubt pneumonia. Will admit for acute on chronic heart failure exacerbation.  Discussed with Dr. Dayna Barker.   Gustavus Bryant, MD 09/11/15 [id number]  Merrily Pew, MD 09/12/15 [id number]

## 2015-09-12 ENCOUNTER — Other Ambulatory Visit: Payer: Medicare Other

## 2015-09-12 ENCOUNTER — Inpatient Hospital Stay (HOSPITAL_COMMUNITY): Payer: Medicare Other

## 2015-09-12 DIAGNOSIS — I509 Heart failure, unspecified: Secondary | ICD-10-CM

## 2015-09-12 LAB — BASIC METABOLIC PANEL
Anion gap: 10 (ref 5–15)
BUN: 21 mg/dL — AB (ref 6–20)
CHLORIDE: 103 mmol/L (ref 101–111)
CO2: 25 mmol/L (ref 22–32)
CREATININE: 2.11 mg/dL — AB (ref 0.44–1.00)
Calcium: 8.5 mg/dL — ABNORMAL LOW (ref 8.9–10.3)
GFR calc Af Amer: 22 mL/min — ABNORMAL LOW (ref >60)
GFR calc non Af Amer: 19 mL/min — ABNORMAL LOW (ref >60)
Glucose, Bld: 108 mg/dL — ABNORMAL HIGH (ref 65–99)
POTASSIUM: 3.7 mmol/L (ref 3.5–5.1)
Sodium: 138 mmol/L (ref 135–145)

## 2015-09-12 MED ORDER — NIFEDIPINE ER OSMOTIC RELEASE 90 MG PO TB24
90.0000 mg | ORAL_TABLET | Freq: Two times a day (BID) | ORAL | Status: DC
  Administered 2015-09-13 – 2015-09-19 (×13): 90 mg via ORAL
  Filled 2015-09-12 (×16): qty 1

## 2015-09-12 MED ORDER — FUROSEMIDE 10 MG/ML IJ SOLN
80.0000 mg | Freq: Three times a day (TID) | INTRAMUSCULAR | Status: DC
  Administered 2015-09-12 – 2015-09-13 (×4): 80 mg via INTRAVENOUS
  Filled 2015-09-12 (×4): qty 8

## 2015-09-12 MED ORDER — ISOSORBIDE MONONITRATE ER 30 MG PO TB24
30.0000 mg | ORAL_TABLET | Freq: Every day | ORAL | Status: DC
  Administered 2015-09-12 – 2015-09-14 (×3): 30 mg via ORAL
  Filled 2015-09-12 (×3): qty 1

## 2015-09-12 MED ORDER — NIFEDIPINE ER 60 MG PO TB24
60.0000 mg | ORAL_TABLET | Freq: Once | ORAL | Status: AC
  Administered 2015-09-12: 60 mg via ORAL
  Filled 2015-09-12: qty 1

## 2015-09-12 NOTE — Progress Notes (Signed)
Pt a/o, no c/o pain, pt has SOB at rest, pt educated on using BSC while she is SOB and O2 put up to 4L, pt had echo done today awaiting results, VSS, pt stable

## 2015-09-12 NOTE — Progress Notes (Signed)
pts O2 sats have been in 90's throughout shift however in the am she was on 3L but her O2 has been bumped up throughout the shift and is now on 5L, pt states she feels a little better with her breathing however, MD notified d/t pt's difficulty maintaning sats so continuous pulse ox ordered, and pt given incentive spirometry, HOB elevated, VSS safety maintained

## 2015-09-12 NOTE — Progress Notes (Signed)
[facility] Progress Note   EDEL DEGRAVE  [id number]  DOB: 1921/03/22  DOA: 09/11/2015 PCP: Mathews Argyle, MD  Brief narrative: Victoria Banks is a 81 y.o. female with a history of coronary artery disease, bioprosthetic aortic valve, hypertension, hyperlipidemia and diastolic heart failure who presents to the hospital with shortness of breath. Furosemide was recently increased from 40 & 80 mg every other day to 80 mg daily.   Subjective: Feels that her shortness of breath has improved a little. No complaint of cough or chest pain. Ankles have been more swollen than usual  Assessment/Plan: Principal Problem:   Acute on chronic diastolic CHF (congestive heart failure)  -She has not had much urine output in the past 24 hours therefore will increase furosemide from 80 mg twice a day to 3 times a day --Continue to follow I and O and daily weights -Repeat echo performed today does not reveal any changes-has normal EF and bioprosthesis appears to be working appropriately  Active Problems:   Acute respiratory failure with hypoxia  -See above-requiring 4 L of oxygen-pulse ox 90%    Aortic valve disorder -Bioprosthetic valve    Essential hypertension, benign -Continue hydralazine, nifedipine and furosemide-Imdur added to decrease preload    CKD (chronic kidney disease) stage 4, GFR 15-29 ml/min  -Follow creatinine with diuretics    Code Status:     Code Status Orders        Start     Ordered   09/11/15 2053  Full code   Continuous     09/11/15 2052    Advance Directive Documentation        Most Recent Value   Type of Advance Directive  Living will   Pre-existing out of facility DNR order (yellow form or pink MOST form)     "MOST" Form in Place?       Family Communication:  Disposition Plan: Home when stable DVT prophylaxis: Lovenox Consultants: Procedures: 2-D echo  Antibiotics: Anti-infectives    None      Objective: Filed Weights   09/11/15 2100 09/12/15 0605  Weight: 52.345 kg (115 lb 6.4 oz) 52.799 kg (116 lb 6.4 oz)    Intake/Output Summary (Last 24 hours) at 09/12/15 1426 Last data filed at 09/12/15 1359  Gross per 24 hour  Intake    720 ml  Output   1351 ml  Net   -631 ml     Vitals Filed Vitals:   09/12/15 0130 09/12/15 0605 09/12/15 0846 09/12/15 1215  BP: 167/63 164/58 161/66 145/62  Pulse: 77 83 81 79  Temp: 97.6 F (36.4 C) 97.7 F (36.5 C) 97.7 F (36.5 C) 98.2 F (36.8 C)  TempSrc: Oral Oral Oral Oral  Resp: 20 20 20 20   Height:      Weight:  52.799 kg (116 lb 6.4 oz)    SpO2: 93% 92% 91% 90%    Exam:  General:  Pt is alert, not in acute distress  HEENT: No icterus, No thrush, oral mucosa moist  Cardiovascular: regular rate and rhythm, S1/S2 No murmur  Respiratory: Coarse crackles at bilateral bases  Abdomen: Soft, +Bowel sounds, non tender, non distended, no guarding  MSK: no cyanosis or clubbing - bilateral pitting edema 2+  Data Reviewed: Basic Metabolic Panel:  Recent Labs Lab 09/11/15 1715 09/12/15 0555  NA 136 138  K 4.9 3.7  CL 102 103  CO2 23 25  GLUCOSE 125* 108*  BUN 25* 21*  CREATININE 2.09* 2.11*  CALCIUM  9.0 8.5*   Liver Function Tests:  Recent Labs Lab 09/11/15 1715  AST 23  ALT 16  ALKPHOS 51  BILITOT 0.5  PROT 6.7  ALBUMIN 3.2*   No results for input(s): LIPASE, AMYLASE in the last 168 hours. No results for input(s): AMMONIA in the last 168 hours. CBC:  Recent Labs Lab 09/11/15 1715  WBC 8.3  NEUTROABS 6.2  HGB 11.7*  HCT 36.0  MCV 87.8  PLT 253   Cardiac Enzymes: No results for input(s): CKTOTAL, CKMB, CKMBINDEX, TROPONINI in the last 168 hours. BNP (last 3 results)  Recent Labs  06/04/15 1157 09/11/15 1715  BNP 925.3* 1681.0*    ProBNP (last 3 results) No results for input(s): PROBNP in the last 8760 hours.  CBG: No results for input(s): GLUCAP in the last 168 hours.  No results found for this or any previous  visit (from the past 240 hour(s)).   Studies: Dg Chest Portable 1 View  09/11/2015  CLINICAL DATA:  Shortness of breath. EXAM: PORTABLE CHEST 1 VIEW COMPARISON:  June 04, 2015 FINDINGS: There are bilateral pleural effusions with cardiomegaly and moderate interstitial edema. There is bibasilar airspace consolidation as well. There is extensive atherosclerotic change in aorta. Patient is status post coronary artery bypass grafting and aortic valve replacement. No bone lesions. No adenopathy appreciable. IMPRESSION: Congestive heart failure. Airspace consolidation in the bases may represent alveolar edema but also may represent superimposed pneumonia. Both congestive heart failure and pneumonia may present concurrently. Electronically Signed   By: Lowella Grip III M.D.   On: 09/11/2015 17:48    Scheduled Meds:  Scheduled Meds: . aspirin EC  81 mg Oral Daily  . enoxaparin (LOVENOX) injection  30 mg Subcutaneous Q24H  . furosemide  80 mg Intravenous TID  . hydrALAZINE  100 mg Oral TID  . isosorbide mononitrate  30 mg Oral Daily  . LORazepam  0.5 mg Oral QHS  . [START ON 09/13/2015] NIFEdipine  90 mg Oral BID  . NIFEdipine  60 mg Oral Once  . pantoprazole  40 mg Oral Daily  . pravastatin  40 mg Oral q1800  . sodium chloride  3 mL Intravenous Q12H   Continuous Infusions:   Time spent on care of this patient: OD 5 min   [place], MD 09/12/2015, 2:26 PM  LOS: 1 day   [facility] Office  [phone] Pager - Text Page per [email or web address] If 7PM-7AM, please contact night-coverage [email or web address]

## 2015-09-12 NOTE — Progress Notes (Signed)
  Echocardiogram 2D Echocardiogram has been performed.  Victoria Banks 09/12/2015, 11:17 AM

## 2015-09-13 ENCOUNTER — Other Ambulatory Visit: Payer: Self-pay

## 2015-09-13 ENCOUNTER — Inpatient Hospital Stay (HOSPITAL_COMMUNITY): Payer: Medicare Other

## 2015-09-13 ENCOUNTER — Encounter (HOSPITAL_COMMUNITY): Payer: Self-pay | Admitting: Student

## 2015-09-13 LAB — BASIC METABOLIC PANEL
Anion gap: 10 (ref 5–15)
BUN: 24 mg/dL — AB (ref 6–20)
CALCIUM: 8 mg/dL — AB (ref 8.9–10.3)
CHLORIDE: 100 mmol/L — AB (ref 101–111)
CO2: 27 mmol/L (ref 22–32)
CREATININE: 2.21 mg/dL — AB (ref 0.44–1.00)
GFR calc Af Amer: 21 mL/min — ABNORMAL LOW (ref >60)
GFR calc non Af Amer: 18 mL/min — ABNORMAL LOW (ref >60)
Glucose, Bld: 130 mg/dL — ABNORMAL HIGH (ref 65–99)
Potassium: 3 mmol/L — ABNORMAL LOW (ref 3.5–5.1)
SODIUM: 137 mmol/L (ref 135–145)

## 2015-09-13 LAB — D-DIMER, QUANTITATIVE: D-Dimer, Quant: 0.9 ug/mL-FEU — ABNORMAL HIGH (ref 0.00–0.50)

## 2015-09-13 LAB — MAGNESIUM: Magnesium: 2.2 mg/dL (ref 1.7–2.4)

## 2015-09-13 MED ORDER — POTASSIUM CHLORIDE CRYS ER 20 MEQ PO TBCR
40.0000 meq | EXTENDED_RELEASE_TABLET | ORAL | Status: AC
  Administered 2015-09-13 (×2): 40 meq via ORAL
  Filled 2015-09-13 (×2): qty 2

## 2015-09-13 MED ORDER — FUROSEMIDE 10 MG/ML IJ SOLN
80.0000 mg | Freq: Two times a day (BID) | INTRAMUSCULAR | Status: DC
  Administered 2015-09-13 – 2015-09-17 (×9): 80 mg via INTRAVENOUS
  Filled 2015-09-13 (×9): qty 8

## 2015-09-13 NOTE — Progress Notes (Addendum)
[facility] Progress Note   [name]  [id number]  DOB: May 28, 1921  DOA: 09/11/2015 PCP: Mathews Argyle, MD  Brief narrative: Victoria Banks is a 81 y.o. female with a history of coronary artery disease, bioprosthetic aortic valve, hypertension, hyperlipidemia and diastolic heart failure who presents to the hospital with shortness of breath. Furosemide was recently increased from 40 & 80 mg every other day to 80 mg daily.   Subjective: Mild dyspnea on exertion. Dry cough. No chest pain.  Assessment/Plan: Principal Problem:   Acute on chronic diastolic CHF (congestive heart failure)  -increased furosemide from 80 mg twice a day to 3 times a day but still not having much output-creatinine rising-have consulted heart failure team --Continue to follow I and O and daily weights -Repeat echo performed today does not reveal any changes-has normal EF and bioprosthesis appears to be working appropriately  Active Problems:   Acute respiratory failure with hypoxia  -See above-requiring 5 L of oxygen-pulse ox 90% -Check d-dimer-unable to do CT scan due to creatinine-VQ will not be very helpful if she also has infiltrates related to edema-may only be able to see the perfusion- ADDENDUM- d dimer only mildly elevated- doubt PE - incentive spirometry    Aortic valve disorder -Bioprosthetic valve    Essential hypertension, benign -Continue nifedipine and Imdur -hydralazine added as well but patient is declining it stating that her doctor took her off of this-DC for now    CKD (chronic kidney disease) stage 4, GFR 15-29 ml/min  -creatinine rising with diuretics    Code Status:     Code Status Orders        Start     Ordered   09/11/15 2053  Full code   Continuous     09/11/15 2052    Advance Directive Documentation        Most Recent Value   Type of Advance Directive  Living will   Pre-existing out of facility DNR order (yellow form or pink MOST form)     "MOST" Form in Place?       Family Communication:  Disposition Plan: Home when stable DVT prophylaxis: Lovenox Consultants: Heart failure team Procedures: 2-D echo  Antibiotics: Anti-infectives    None      Objective: Filed Weights   09/11/15 2100 09/12/15 0605 09/13/15 0432  Weight: 52.345 kg (115 lb 6.4 oz) 52.799 kg (116 lb 6.4 oz) 52.345 kg (115 lb 6.4 oz)    Intake/Output Summary (Last 24 hours) at 09/13/15 1145 Last data filed at 09/13/15 [id number]  Gross per 24 hour  Intake    960 ml  Output   2000 ml  Net  -1040 ml     Vitals Filed Vitals:   09/12/15 2038 09/13/15 0432 09/13/15 0834 09/13/15 1105  BP: 164/59 151/67  158/56  Pulse: 116 94  73  Temp: 98.8 F (37.1 C) 98.6 F (37 C)    TempSrc: Oral Oral    Resp: 20 20    Height:      Weight:  52.345 kg (115 lb 6.4 oz)    SpO2: 93% 90% 90%     Exam:  General:  Pt is alert, not in acute distress  HEENT: No icterus, No thrush, oral mucosa moist  Cardiovascular: regular rate and rhythm, S1/S2 No murmur  Respiratory: Coarse crackles at bilateral bases  Abdomen: Soft, +Bowel sounds, non tender, non distended, no guarding  MSK: no cyanosis or clubbing - bilateral pitting edema 2+  Data Reviewed:  Basic Metabolic Panel:  Recent Labs Lab 09/11/15 1715 09/12/15 0555 09/13/15 0651 09/13/15 1025  NA 136 138 137  --   K 4.9 3.7 3.0*  --   CL 102 103 100*  --   CO2 23 25 27   --   GLUCOSE 125* 108* 130*  --   BUN 25* 21* 24*  --   CREATININE 2.09* 2.11* 2.21*  --   CALCIUM 9.0 8.5* 8.0*  --   MG  --   --   --  2.2   Liver Function Tests:  Recent Labs Lab 09/11/15 1715  AST 23  ALT 16  ALKPHOS 51  BILITOT 0.5  PROT 6.7  ALBUMIN 3.2*   No results for input(s): LIPASE, AMYLASE in the last 168 hours. No results for input(s): AMMONIA in the last 168 hours. CBC:  Recent Labs Lab 09/11/15 1715  WBC 8.3  NEUTROABS 6.2  HGB 11.7*  HCT 36.0  MCV 87.8  PLT 253   Cardiac Enzymes: No results  for input(s): CKTOTAL, CKMB, CKMBINDEX, TROPONINI in the last 168 hours. BNP (last 3 results)  Recent Labs  06/04/15 1157 09/11/15 1715  BNP 925.3* 1681.0*    ProBNP (last 3 results) No results for input(s): PROBNP in the last 8760 hours.  CBG: No results for input(s): GLUCAP in the last 168 hours.  No results found for this or any previous visit (from the past 240 hour(s)).   Studies: Dg Chest Portable 1 View  09/11/2015  CLINICAL DATA:  Shortness of breath. EXAM: PORTABLE CHEST 1 VIEW COMPARISON:  June 04, 2015 FINDINGS: There are bilateral pleural effusions with cardiomegaly and moderate interstitial edema. There is bibasilar airspace consolidation as well. There is extensive atherosclerotic change in aorta. Patient is status post coronary artery bypass grafting and aortic valve replacement. No bone lesions. No adenopathy appreciable. IMPRESSION: Congestive heart failure. Airspace consolidation in the bases may represent alveolar edema but also may represent superimposed pneumonia. Both congestive heart failure and pneumonia may present concurrently. Electronically Signed   By: Lowella Grip III M.D.   On: 09/11/2015 17:48    Scheduled Meds:  Scheduled Meds: . aspirin EC  81 mg Oral Daily  . enoxaparin (LOVENOX) injection  30 mg Subcutaneous Q24H  . furosemide  80 mg Intravenous TID  . isosorbide mononitrate  30 mg Oral Daily  . LORazepam  0.5 mg Oral QHS  . NIFEdipine  90 mg Oral BID  . pantoprazole  40 mg Oral Daily  . potassium chloride  40 mEq Oral Q4H  . pravastatin  40 mg Oral q1800  . sodium chloride  3 mL Intravenous Q12H   Continuous Infusions:   Time spent on care of this patient: 35 min   [place], MD 09/13/2015, 11:45 AM  LOS: 2 days   [facility] Office  [phone] Pager - Text Page per [email or web address] If 7PM-7AM, please contact night-coverage [email or web address]

## 2015-09-13 NOTE — Consult Note (Signed)
   [facility] Inpatient Consult   09/13/2015  Victoria Banks Apr 20, 1921 [id number] Patient evaluated for community based chronic disease management services with [facility] as a benefit of patient's Medicare Insurance. Spoke with patient at bedside to explain [facility] [facility] services. Patient endorses that she is at [facility] facility.  She endorses that her primary care provider is Dr. Lajean Manes. Consent form was signed.   Patient will receive post hospital discharge call and will be evaluated for monthly home visits for assessments and disease process education.  Left contact information and [facility] literature at bedside. Made Inpatient Case Manager aware that [facility] following. Of note, [facility] Care Management services does not replace or interfere with any services that are arranged by inpatient case management or social work.  For additional questions or referrals please contact:  Natividad Brood, RN BSN [facility] Liaison  [phone] business mobile phone

## 2015-09-13 NOTE — Care Management Note (Signed)
Case Management Note  Patient Details  Name: KERRIE COLLUMS MRN: [id number] Date of Birth: September 24, 1921  Subjective/Objective:    Admitted with CHF                Action/Plan: Patient lives at [employer]; daughter at bedside. HHC choice offered, patient chose [facility]. [facility] is contracted with [facility] for [facility] services but the patient does not like them and wants to use [facility] for HHPT/OT. [name] with [facility] made aware. Also [employer]ater ( rolling walker with seat) ordered- daughter will take it to her home. Attending MD at discharge please enter the face to face document as per Medicare requirements for [facility] services.  Expected Discharge Date:    possibly 09/18/2015              Expected Discharge Plan:  [place]  Discharge planning Services  CM Consult  Choice offered to:  Patient  DME Arranged:  Walker rolling with seat DME Agency:  [facility] Arranged:  PT, OT [facility] Agency:  [facility]  Status of Service:  In process, will continue to follow  Sherrilyn Rist [id number] 09/13/2015, 11:47 AM

## 2015-09-13 NOTE — Evaluation (Signed)
Physical Therapy Evaluation Patient Details Name: [facility] MRN: [id number] DOB: 12-03-1920 Today's Date: 09/13/2015   History of Present Illness  81 year old female with a history of hypertension, hyperlipidemia, coronary artery disease, bioprosthetic aortic valve and diastolic CHF presented with one-week history of worsening shortness of breath.  Clinical Impression  Pt presents with decreased strength and mobility and activity tolerance. Pt spO2 85% on 6LO2 during gait. Pt will benefit from continued acute PT services to address deficits, increase strength, mobility and balance. Pt does not want to have assistance at home. PT recommends 24 hour assistance due to decreased O2 sat with activity, pt needs assist managing O2 tubing with mobility.    Follow Up Recommendations Home health PT;Supervision/Assistance - 24 hour    Equipment Recommendations  Rolling walker with 5" wheels    Recommendations for Other Services OT consult     Precautions / Restrictions Restrictions Weight Bearing Restrictions: No      Mobility  Bed Mobility               General bed mobility comments: Pt in chair on PT arrival  Transfers Overall transfer level: Needs assistance Equipment used: Rolling walker (2 wheeled) Transfers: Sit to/from Stand Sit to Stand: Supervision         General transfer comment: cues for UE placement  Ambulation/Gait Ambulation/Gait assistance: Supervision Ambulation Distance (Feet): 80 Feet Assistive device: Rolling walker (2 wheeled)   Gait velocity: decreased   General Gait Details: no LOB, 2/4 dyspnea. spO2 dropped to 85% during gait on 6LO2, returned to 90% with seated rest  Stairs            Wheelchair Mobility    Modified Rankin (Stroke Patients Only)       Balance                                             Pertinent Vitals/Pain Pain Assessment: No/denies pain    Home Living Family/patient expects to be  discharged to:: Private residence Living Arrangements: Alone Available Help at Discharge: Available PRN/intermittently Type of Home: Independent living facility Home Access: Level entry     Home Layout: One level Home Equipment: None      Prior Function Level of Independence: Independent               Hand Dominance        Extremity/Trunk Assessment               Lower Extremity Assessment: Generalized weakness      Cervical / Trunk Assessment: Kyphotic  Communication   Communication: No difficulties  Cognition Arousal/Alertness: Awake/alert Behavior During Therapy: WFL for tasks assessed/performed Overall Cognitive Status: Within Functional Limits for tasks assessed                      General Comments      Exercises        Assessment/Plan    PT Assessment Patient needs continued PT services  PT Diagnosis Difficulty walking;Generalized weakness   PT Problem List Decreased strength;Decreased activity tolerance;Decreased balance;Decreased mobility;Cardiopulmonary status limiting activity  PT Treatment Interventions DME instruction;Balance training;Gait training;Functional mobility training;Therapeutic activities;Therapeutic exercise;Patient/family education   PT Goals (Current goals can be found in the Care Plan section) Acute Rehab PT Goals Patient Stated Goal: go home alone PT Goal Formulation: With patient Time For Goal  Achievement: 09/27/15 Potential to Achieve Goals: Good    Frequency Min 2X/week   Barriers to discharge Decreased caregiver support pt lives at independent living, not open to assisted living at this time    Co-evaluation               End of Session Equipment Utilized During Treatment: Oxygen;Gait belt Activity Tolerance: Patient tolerated treatment well Patient left: in chair;with call bell/phone within reach Nurse Communication: Mobility status         Time: [id number] PT Time Calculation (min)  (ACUTE ONLY): 15 min   Charges:   PT Evaluation $Initial PT Evaluation Tier I: 1 Procedure     PT G Codes:        Angelyse Heslin 2015-09-30, 8:37 AM

## 2015-09-13 NOTE — Care Management Important Message (Signed)
Important Message  Patient Details  Name: [facility] MRN: [id number] Date of Birth: Mar 13, 1921   Medicare Important Message Given:  Yes    Rahima Fleishman P Hanalei Glace 09/13/2015, 2:18 PM

## 2015-09-13 NOTE — Progress Notes (Signed)
Utilization review completed. Shalin Linders, RN, BSN. 

## 2015-09-13 NOTE — Consult Note (Signed)
CARDIOLOGY CONSULT NOTE   Patient ID: [facility] MRN: [id number], DOB/AGE: Feb 19, 1921   Admit date: 09/11/2015 Date of Consult: 09/13/2015 Reason for Consult: CHF Exacerbation   Primary Physician: Victoria Argyle, MD Primary Cardiologist: Previously Dr. Aundra Banks, Does not wish to be seen by him in the future.   HPI: Victoria Banks is a 81 y.o. female with past medical history of chronic diastolic CHF, HTN, CAD (s/p CABG in 2011 with SVG-OM), s/p AVR in 2011, and Stage 3 CKD (baseline 1.6-1.8) who presented [facility] on 09/11/2015 for worsening dyspnea over the past two weeks.  Patient reports her symptoms have consisted of dyspnea on exertion and at rest for the past few weeks. She endorses orthopnea, saying she has been sleeping in her recliner due to feeling like she would suffocate in her bed. Also reports increased lower extremity edema. Denies any chest pain, palpitations, nausea, vomiting, or PND. She was seen by her PCP on 09/05/2015 and her Lasix was increased to 80mg  daily (had previously been alternating between 40mg  and 80mg  daily). However, her symptoms persisted which prompted her to come to the ED.  She does not weigh daily but says her weight use to be between 118 lbs and 120 lbs. Noted to be 121 lbs during her last office visit on 06/04/2015. Her weight was 115 lbs on admission. Perhaps she has lost muscle and fat mass, so a new baseline weight will need to be established.  Since being admitted, she was started on Lasix 80mg  IV BID but this was switched to 80mg  TID yesterday. She diuresed -2.4L yesterday but only had a recorded net output of -1.4L this admission. She reports having one incontinence episode yesterday due to not making it to the restroom quickly enough. Her weight has remained stable at 115 lbs.  Her creatinine at time of admission was 2.09. This is elevated to 2.21 on 09/13/2015. BNP elevated to 1681.  She reports significant improvement in her  respiratory symptoms. Still on 3L [place] (no oxygen requirement PTA). Was requiring 5L at time of admission.  A repeat echocardiogram was obtained showing an EF of 55-60%, consistent with her previous EF in 06/2014. Also showed her prosthetic aortic valve was functioning normally.      Problem List  Past Medical History  Diagnosis Date  . Allergic rhinitis   . Hypertension   . Fibrocystic breast disease   . GERD (gastroesophageal reflux disease)   . Aortic stenosis, severe 08/2009    s/p AVR w pericardial tissue valve 10/2009  . Osteoporosis 12/2010    Reclast given  . Multinodular goiter   . Coronary artery disease 10/2009    S/P SVG to OM   . CRD (chronic renal disease), stage III   . Positive for microalbuminuria     On tevetan  . Benign familial tremor   . Atelectasis     scarring at basis on CXR    History reviewed. No pertinent past surgical history.   Allergies  Allergies  Allergen Reactions  . Aceon [Perindopril] Other (See Comments)    Chest Pain  . Beta Adrenergic Blockers Other (See Comments)    Serious cough both times she tried.  Marland Kitchen Hydralazine Hcl Cough  . Actonel [Risedronate Sodium] Other (See Comments)    Unknown  . Allegra [Fexofenadine] Other (See Comments)    BACK PAIN  . Biaxin [Clarithromycin] Nausea Only  . Ciprofloxacin Nausea Only  . Crestor [Rosuvastatin] Itching  . Fosamax [Alendronate Sodium]  Other (See Comments)    Muscle pain  . Lipitor [Atorvastatin] Itching  . Promethazine Hcl Other (See Comments)    Unknown  . Simvastatin Rash  . Sulfa Antibiotics Other (See Comments)    GI Upset  . Welchol [Colesevelam Hcl] Rash      Inpatient Medications  . aspirin EC  81 mg Oral Daily  . enoxaparin (LOVENOX) injection  30 mg Subcutaneous Q24H  . furosemide  80 mg Intravenous TID  . isosorbide mononitrate  30 mg Oral Daily  . LORazepam  0.5 mg Oral QHS  . NIFEdipine  90 mg Oral BID  . pantoprazole  40 mg Oral Daily  . potassium chloride   40 mEq Oral Q4H  . pravastatin  40 mg Oral q1800  . sodium chloride  3 mL Intravenous Q12H    Family History History reviewed. No pertinent family history.   Social History Social History   Social History  . Marital Status: Widowed    Spouse Name: N/A  . Number of Children: N/A  . Years of Education: N/A   Occupational History  . Not on file.   Social History Main Topics  . Smoking status: Never Smoker   . Smokeless tobacco: Not on file  . Alcohol Use: No  . Drug Use: No  . Sexual Activity: Not on file   Other Topics Concern  . Not on file   Social History Narrative     Review of Systems General:  No chills, fever, night sweats or weight changes.  Cardiovascular:  No chest pain, palpitations, or paroxysmal nocturnal dyspnea. Positive for orthopnea, dyspnea on exertion, and lower extremity edema. Dermatological: No rash, lesions/masses Respiratory: No cough, Positive for dyspnea Urologic: No hematuria, dysuria Abdominal:   No nausea, vomiting, diarrhea, bright red blood per rectum, melena, or hematemesis Neurologic:  No visual changes, wkns, changes in mental status. All other systems reviewed and are otherwise negative except as noted above.  Physical Exam Blood pressure 158/56, pulse 73, temperature 98.6 F (37 C), temperature source Oral, resp. rate 20, height 5\' 1"  (1.549 m), weight 115 lb 6.4 oz (52.345 kg), SpO2 90 %.  General: Pleasant, elderly female in NAD Psych: Normal affect. Neuro: Alert and oriented X 3. Moves all extremities spontaneously. HEENT: Normal  Neck: Supple without bruits. JVD elevated. Lungs:  Resp regular and unlabored, Rales at bases bilaterally. Heart: RRR no s3, s4, or murmurs. Crisp valve sounds. Abdomen: Soft, non-tender, non-distended, BS + x 4.  Extremities: No clubbing or cyanosis. 1+ pitting edema bilaterally, worse on right. DP/PT/Radials 2+ and equal bilaterally.  Labs No results for input(s): CKTOTAL, CKMB, TROPONINI in the  last 72 hours. Lab Results  Component Value Date   WBC 8.3 09/11/2015   HGB 11.7* 09/11/2015   HCT 36.0 09/11/2015   MCV 87.8 09/11/2015   PLT 253 09/11/2015    Recent Labs Lab 09/11/15 1715  09/13/15 0651  NA 136  < > 137  K 4.9  < > 3.0*  CL 102  < > 100*  CO2 23  < > 27  BUN 25*  < > 24*  CREATININE 2.09*  < > 2.21*  CALCIUM 9.0  < > 8.0*  PROT 6.7  --   --   BILITOT 0.5  --   --   ALKPHOS 51  --   --   ALT 16  --   --   AST 23  --   --   GLUCOSE 125*  < >  130*  < > = values in this interval not displayed. Lab Results  Component Value Date   CHOL 201* 06/04/2015   HDL 56 06/04/2015   LDLCALC 105* 06/04/2015   TRIG 201* 06/04/2015    Radiology/Studies Dg Chest Portable 1 View: 09/11/2015  CLINICAL DATA:  Shortness of breath. EXAM: PORTABLE CHEST 1 VIEW COMPARISON:  June 04, 2015 FINDINGS: There are bilateral pleural effusions with cardiomegaly and moderate interstitial edema. There is bibasilar airspace consolidation as well. There is extensive atherosclerotic change in aorta. Patient is status post coronary artery bypass grafting and aortic valve replacement. No bone lesions. No adenopathy appreciable. IMPRESSION: Congestive heart failure. Airspace consolidation in the bases may represent alveolar edema but also may represent superimposed pneumonia. Both congestive heart failure and pneumonia may present concurrently. Electronically Signed   By: Lowella Grip III M.D.   On: 09/11/2015 17:48    ECG: NSR, rate in 70's, no acute ST or T-wave changes.  ECHOCARDIOGRAM: 09/12/2015 Study Conclusions - Left ventricle: The cavity size was normal. Wall thickness was increased in a pattern of mild LVH. Systolic function was normal. The estimated ejection fraction was in the range of 55% to 60%. Wall motion was normal; there were no regional wall motion abnormalities. - Aortic valve: A bioprosthesis was present and functioning normally. There was very mild  stenosis. - Mitral valve: There was mild regurgitation. - Pulmonary arteries: PA peak pressure: 43 mm Hg (S). - Pericardium, extracardiac: There was a left pleural effusion.  ASSESSMENT AND PLAN  1. Acute on Chronic Diastolic CHF - presented with worsening dyspnea, orthopnea, and lower extremity edema over the past two weeks. PO Lasix had been increased to 80mg  daily (previously alternating between 40mg  and 80mg  daily) with no improvement in her symptoms. BNP elevated to 1681. - repeat echo shows preserved EF of 55-60%, unchanged from previous tracings.  - reports baseline weight of 118 - 120lbs but has not weighed in months. Was 115 lbs on admission and at this weight today. Still appears volume overloaded on exam. Will need to establish new dry weight. - Her net output is recorded as - 1.4L. This is likely inaccurate as she reports an incontinence episode yesterday due to not being able to make it to the restroom quickly enough. Had a reported net output of -2.4L so she likely diuresed around -3.0L yesterday. - Was on Lasix 80mg  IV BID and this was increased to lasix 80mg  TID to improve her output. - will likely need to decrease Lasix dosing in the setting of her AKI. Will change back to 80mg  BID for now, and may have to further decrease dose pending creatinine response.  - If she quits responding to Lasix, could consider Metolazone.  2. CAD - s/p CABG in 2011 with SVG-OM at the time of her AVR. - continue ASA, Procardia, and statin  3. AKI on Stage 3 CKD - baseline creatinine of 1.6 - 1.8. - had been elevated to 2.0 prior to admission in 05/2015. - 2.09 on 09/11/2015, trending upward to 2.21 on 09/13/2015. - obtaining renal US  4. S/P AVR - in 2011 - appears to be functioning normally on echo.   Signed, Erma Heritage, PA-C 09/13/2015, 12:01 PM Pager: [phone]   Attending Note:   The patient was seen and examined.  Agree with assessment and plan as noted above.   Changes made to the above note as needed.  She has normal LV systolic function .   Develops cardiorenal syndrome with  aggressive diuresis.   (occurred during her last admission )  Her creatinine is trending upward  We have decreased the Lasix to BID - she may need it reduced more than that if her creatinine continues to increase.    Thayer Headings, Brooke Bonito., MD, [facility] 09/13/2015, 3:45 PM [address],  [place] Pager [phone]

## 2015-09-13 NOTE — Progress Notes (Signed)
OT Cancellation Note  Patient Details Name: [facility] MRN: [id number] DOB: [birth year]   Cancelled Treatment:    Reason Eval/Treat Not Completed: Medical issues which prohibited therapy -- K+3.0. Will follow up for OT evaluation at a later time.  Gabreal Worton A 09/13/2015, 8:26 AM

## 2015-09-14 DIAGNOSIS — J9601 Acute respiratory failure with hypoxia: Secondary | ICD-10-CM

## 2015-09-14 DIAGNOSIS — Z954 Presence of other heart-valve replacement: Secondary | ICD-10-CM

## 2015-09-14 DIAGNOSIS — I25812 Atherosclerosis of bypass graft of coronary artery of transplanted heart without angina pectoris: Secondary | ICD-10-CM

## 2015-09-14 DIAGNOSIS — N184 Chronic kidney disease, stage 4 (severe): Secondary | ICD-10-CM

## 2015-09-14 DIAGNOSIS — I10 Essential (primary) hypertension: Secondary | ICD-10-CM

## 2015-09-14 DIAGNOSIS — I5033 Acute on chronic diastolic (congestive) heart failure: Secondary | ICD-10-CM

## 2015-09-14 DIAGNOSIS — N179 Acute kidney failure, unspecified: Secondary | ICD-10-CM | POA: Insufficient documentation

## 2015-09-14 LAB — BASIC METABOLIC PANEL
Anion gap: 9 (ref 5–15)
BUN: 25 mg/dL — AB (ref 6–20)
CHLORIDE: 101 mmol/L (ref 101–111)
CO2: 26 mmol/L (ref 22–32)
CREATININE: 2.24 mg/dL — AB (ref 0.44–1.00)
Calcium: 7.9 mg/dL — ABNORMAL LOW (ref 8.9–10.3)
GFR calc Af Amer: 21 mL/min — ABNORMAL LOW (ref >60)
GFR, EST NON AFRICAN AMERICAN: 18 mL/min — AB (ref >60)
GLUCOSE: 114 mg/dL — AB (ref 65–99)
Potassium: 3.6 mmol/L (ref 3.5–5.1)
Sodium: 136 mmol/L (ref 135–145)

## 2015-09-14 LAB — BRAIN NATRIURETIC PEPTIDE: B NATRIURETIC PEPTIDE 5: 1257.4 pg/mL — AB (ref 0.0–100.0)

## 2015-09-14 MED ORDER — ISOSORB DINITRATE-HYDRALAZINE 20-37.5 MG PO TABS
1.0000 | ORAL_TABLET | Freq: Two times a day (BID) | ORAL | Status: DC
  Administered 2015-09-14: 1 via ORAL
  Filled 2015-09-14 (×2): qty 1

## 2015-09-14 MED ORDER — METOLAZONE 2.5 MG PO TABS
2.5000 mg | ORAL_TABLET | Freq: Once | ORAL | Status: AC
  Administered 2015-09-14: 2.5 mg via ORAL
  Filled 2015-09-14: qty 1

## 2015-09-14 NOTE — Progress Notes (Signed)
[facility] PROGRESS NOTE    Progress Note   [facility] [id number] DOB: 1921-06-09 DOA: 09/11/2015 PCP: Mathews Argyle, MD   Brief Narrative:   [facility] is an 81 y.o. female past medical history of coronary artery sees, by a prostatic valve comes into the hospital for dyspnea, her furosemide was recently increased from 40-80  Assessment/Plan:   Acute respiratory failure with hypoxia ([place]) due to Acute on chronic diastolic CHF (congestive heart failure) (HCC)/Cardiorenal syndrome: Echocardiogram on 07/18/2015 showed a grade 2 diastolic heart failure. She seems to be fluid overloaded. She is on Lasix 80 mg IV twice a day, will discuss with cardiology to admit was on 2.5 mg. Patient could not tolerate an ACE inhibitor due to low GFR. Blood pressure continues to be elevated will add low-dose BiDil.  Aortic valve disorder: 2-D echo showing mild aortic stenosis.  Essential hypertension, benign: Currently elevated likely due to volume overload. I hope that with diuresis her blood pressure will improve. We'll DC her imdurand start her on BiDil in the morning.  Acute kidney injury on CKD (chronic kidney disease) stage 4, GFR 15-29 ml/min ([place]): Baseline creatinine is 1.8-1.9. Her creatinine continues to worsen despite IV diuresis. This likely due to cardiorenal syndrome. Continue IV Lasix at low-dose metolazone will discuss with cardiology.    DVT Prophylaxis - Lovenox ordered.  Family Communication:none Disposition Plan: Home in 2-3 days Code Status:     Code Status Orders        Start     Ordered   09/11/15 2053  Full code   Continuous     09/11/15 2052    Advance Directive Documentation        Most Recent Value   Type of Advance Directive  Living will   Pre-existing out of facility DNR order (yellow form or pink MOST form)     "MOST" Form in Place?          IV Access:    Peripheral IV   Procedures and diagnostic studies:   US  Renal  09/13/2015  CLINICAL DATA:  Acute renal insufficiency.  Initial encounter. EXAM: RENAL / URINARY TRACT ULTRASOUND COMPLETE COMPARISON:  None. FINDINGS: Right Kidney: Length: 8.6 cm 1.3 cm cystic lesion upper pole right kidney has some layering calcification or debris. Echogenicity appears mildly increased. No mass or hydronephrosis visualized. Left Kidney: Length: 9.2 cm. Echogenicity appears mildly increased. No mass or hydronephrosis visualized. Simple appearing cysts are identified measuring 1.8 and 2.4 cm respectively. Bladder: Appears normal for degree of bladder distention. IMPRESSION: Mild increased echogenicity could be compatible with medical renal disease. No evidence for hydronephrosis. Electronically Signed   By: Misty Stanley M.D.   On: 09/13/2015 14:40     Medical Consultants:    None.  Anti-Infectives:   Anti-infectives    None      Subjective:    [facility] she relates she is still short of breath only mildly improved.  Objective:    Filed Vitals:   09/13/15 1207 09/13/15 1805 09/13/15 2213 09/14/15 0400  BP: 160/63 154/61 160/62 141/58  Pulse: 79 79 74 77  Temp: 97.8 F (36.6 C)  99 F (37.2 C) 97.8 F (36.6 C)  TempSrc: Oral  Oral Oral  Resp: 18  18 18   Height:      Weight:    53.2 kg (117 lb 4.6 oz)  SpO2: 96% 93% 94% 90%    Intake/Output Summary (Last 24 hours) at 09/14/15 1036 Last  data filed at 09/14/15 0942  Gross per 24 hour  Intake   1060 ml  Output   1870 ml  Net   -810 ml   Filed Weights   09/12/15 0605 09/13/15 0432 09/14/15 0400  Weight: 52.799 kg (116 lb 6.4 oz) 52.345 kg (115 lb 6.4 oz) 53.2 kg (117 lb 4.6 oz)    Exam: Gen:  NAD Cardiovascular:  RRR. Positive JVD Chest and lungs:   Good air movement with crackles bilaterally halfway up her lungs. Abdomen:  Abdomen soft, NT/ND, + BS Extremities:  No C/E/C   Data Reviewed:    Labs: Basic Metabolic Panel:  Recent Labs Lab 09/11/15 1715 09/12/15 0555  09/13/15 0651 09/13/15 1025 09/14/15 0506  NA 136 138 137  --  136  K 4.9 3.7 3.0*  --  3.6  CL 102 103 100*  --  101  CO2 23 25 27   --  26  GLUCOSE 125* 108* 130*  --  114*  BUN 25* 21* 24*  --  25*  CREATININE 2.09* 2.11* 2.21*  --  2.24*  CALCIUM 9.0 8.5* 8.0*  --  7.9*  MG  --   --   --  2.2  --    GFR Estimated Creatinine Clearance: 12.1 mL/min (by C-G formula based on Cr of 2.24). Liver Function Tests:  Recent Labs Lab 09/11/15 1715  AST 23  ALT 16  ALKPHOS 51  BILITOT 0.5  PROT 6.7  ALBUMIN 3.2*   No results for input(s): LIPASE, AMYLASE in the last 168 hours. No results for input(s): AMMONIA in the last 168 hours. Coagulation profile No results for input(s): INR, PROTIME in the last 168 hours.  CBC:  Recent Labs Lab 09/11/15 1715  WBC 8.3  NEUTROABS 6.2  HGB 11.7*  HCT 36.0  MCV 87.8  PLT 253   Cardiac Enzymes: No results for input(s): CKTOTAL, CKMB, CKMBINDEX, TROPONINI in the last 168 hours. BNP (last 3 results) No results for input(s): PROBNP in the last 8760 hours. CBG: No results for input(s): GLUCAP in the last 168 hours. D-Dimer:  Recent Labs  09/13/15 1325  DDIMER 0.90*   Hgb A1c: No results for input(s): HGBA1C in the last 72 hours. Lipid Profile: No results for input(s): CHOL, HDL, LDLCALC, TRIG, CHOLHDL, LDLDIRECT in the last 72 hours. Thyroid function studies: No results for input(s): TSH, T4TOTAL, T3FREE, THYROIDAB in the last 72 hours.  Invalid input(s): FREET3 Anemia work up: No results for input(s): VITAMINB12, FOLATE, FERRITIN, TIBC, IRON, RETICCTPCT in the last 72 hours. Sepsis Labs:  Recent Labs Lab 09/11/15 1715  WBC 8.3   Microbiology No results found for this or any previous visit (from the past 240 hour(s)).   Medications:   . aspirin EC  81 mg Oral Daily  . enoxaparin (LOVENOX) injection  30 mg Subcutaneous Q24H  . furosemide  80 mg Intravenous BID  . isosorbide mononitrate  30 mg Oral Daily  .  LORazepam  0.5 mg Oral QHS  . NIFEdipine  90 mg Oral BID  . pantoprazole  40 mg Oral Daily  . pravastatin  40 mg Oral q1800  . sodium chloride  3 mL Intravenous Q12H   Continuous Infusions:   Time spent: 25 min   LOS: 3 days   [name]  [facility] Pager [phone]  *Please refer to [email or web address], password [email or web address] to get updated schedule on who will round on this patient, as hospitalists switch teams weekly. If 7PM-7AM, please contact night-coverage  at [email or web address], password [email or web address] for any overnight needs.  09/14/2015, 10:36 AM

## 2015-09-14 NOTE — Progress Notes (Signed)
Pt refused taking the ordered isosorbide-hydralazine 20-37.5mg , stating that it makes her "cough."  Dr. Aileen Fass has been notified and is aware.

## 2015-09-14 NOTE — Progress Notes (Signed)
SUBJECTIVE: Says breathing has improved since yesterday. Denies chest pain. Has leg swelling.     Intake/Output Summary (Last 24 hours) at 09/14/15 1034 Last data filed at 09/14/15 [id number]  Gross per 24 hour  Intake   1060 ml  Output   1870 ml  Net   -810 ml    Current Facility-Administered Medications  Medication Dose Route Frequency Provider Last Rate Last Dose  . 0.9 %  sodium chloride infusion  250 mL Intravenous PRN Orson Eva, MD      . acetaminophen (TYLENOL) tablet 650 mg  650 mg Oral Q4H PRN Orson Eva, MD      . aspirin EC tablet 81 mg  81 mg Oral Daily Orson Eva, MD   81 mg at 09/14/15 0854  . enoxaparin (LOVENOX) injection 30 mg  30 mg Subcutaneous Q24H Orson Eva, MD   30 mg at 09/13/15 2216  . furosemide (LASIX) injection 80 mg  80 mg Intravenous BID Erma Heritage, [place]   80 mg at 09/14/15 0854  . isosorbide mononitrate (IMDUR) 24 hr tablet 30 mg  30 mg Oral Daily Debbe Odea, MD   30 mg at 09/14/15 0855  . ketotifen (ZADITOR) 0.025 % ophthalmic solution 1 drop  1 drop Both Eyes PRN Orson Eva, MD      . LORazepam (ATIVAN) tablet 0.5 mg  0.5 mg Oral QHS Orson Eva, MD   0.5 mg at 09/13/15 2215  . NIFEdipine (PROCARDIA XL/ADALAT-CC) 24 hr tablet 90 mg  90 mg Oral BID Debbe Odea, MD   90 mg at 09/14/15 0853  . ondansetron (ZOFRAN) injection 4 mg  4 mg Intravenous Q6H PRN Orson Eva, MD      . pantoprazole (PROTONIX) EC tablet 40 mg  40 mg Oral Daily Orson Eva, MD   40 mg at 09/14/15 0854  . pravastatin (PRAVACHOL) tablet 40 mg  40 mg Oral q1800 Orson Eva, MD   40 mg at 09/13/15 1806  . sodium chloride 0.9 % injection 3 mL  3 mL Intravenous Q12H Orson Eva, MD   3 mL at 09/14/15 0855  . sodium chloride 0.9 % injection 3 mL  3 mL Intravenous PRN Orson Eva, MD        Filed Vitals:   09/13/15 1207 09/13/15 1805 09/13/15 2213 09/14/15 0400  BP: 160/63 154/61 160/62 141/58  Pulse: 79 79 74 77  Temp: 97.8 F (36.6 C)  99 F (37.2 C) 97.8 F (36.6 C)  TempSrc: Oral   Oral Oral  Resp: 18  18 18   Height:      Weight:    117 lb 4.6 oz (53.2 kg)  SpO2: 96% 93% 94% 90%    PHYSICAL EXAM General: Pleasant, elderly female in NAD Psych: Normal affect. Neuro: Alert and oriented X 3. Moves all extremities spontaneously. HEENT: Normal Neck: Supple without bruits. JVD elevated. Lungs: Resp regular and unlabored, Rales at bases bilaterally. Heart: RRR no s3, s4, or murmurs. Crisp valve sounds. Abdomen: Soft, non-tender, non-distended, BS + x 4.  Extremities: No clubbing or cyanosis. 1+ pitting edema bilaterally, worse on right. DP/PT/Radials 2+ and equal bilaterally.  TELEMETRY: Reviewed telemetry pt in sinus rhythm with PVC's.  LABS: Basic Metabolic Panel:  Recent Labs  09/13/15 0651 09/13/15 1025 09/14/15 0506  NA 137  --  136  K 3.0*  --  3.6  CL 100*  --  101  CO2 27  --  26  GLUCOSE 130*  --  114*  BUN 24*  --  25*  CREATININE 2.21*  --  2.24*  CALCIUM 8.0*  --  7.9*  MG  --  2.2  --    Liver Function Tests:  Recent Labs  09/11/15 1715  AST 23  ALT 16  ALKPHOS 51  BILITOT 0.5  PROT 6.7  ALBUMIN 3.2*   No results for input(s): LIPASE, AMYLASE in the last 72 hours. CBC:  Recent Labs  09/11/15 1715  WBC 8.3  NEUTROABS 6.2  HGB 11.7*  HCT 36.0  MCV 87.8  PLT 253   Cardiac Enzymes: No results for input(s): CKTOTAL, CKMB, CKMBINDEX, TROPONINI in the last 72 hours. BNP: Invalid input(s): POCBNP D-Dimer:  Recent Labs  09/13/15 1325  DDIMER 0.90*   Hemoglobin A1C: No results for input(s): HGBA1C in the last 72 hours. Fasting Lipid Panel: No results for input(s): CHOL, HDL, LDLCALC, TRIG, CHOLHDL, LDLDIRECT in the last 72 hours. Thyroid Function Tests: No results for input(s): TSH, T4TOTAL, T3FREE, THYROIDAB in the last 72 hours.  Invalid input(s): FREET3 Anemia Panel: No results for input(s): VITAMINB12, FOLATE, FERRITIN, TIBC, IRON, RETICCTPCT in the last 72 hours.  RADIOLOGY: US Renal  09/13/2015   CLINICAL DATA:  Acute renal insufficiency.  Initial encounter. EXAM: RENAL / URINARY TRACT ULTRASOUND COMPLETE COMPARISON:  None. FINDINGS: Right Kidney: Length: 8.6 cm 1.3 cm cystic lesion upper pole right kidney has some layering calcification or debris. Echogenicity appears mildly increased. No mass or hydronephrosis visualized. Left Kidney: Length: 9.2 cm. Echogenicity appears mildly increased. No mass or hydronephrosis visualized. Simple appearing cysts are identified measuring 1.8 and 2.4 cm respectively. Bladder: Appears normal for degree of bladder distention. IMPRESSION: Mild increased echogenicity could be compatible with medical renal disease. No evidence for hydronephrosis. Electronically Signed   By: Misty Stanley M.D.   On: 09/13/2015 14:40   Dg Chest Portable 1 View  09/11/2015  CLINICAL DATA:  Shortness of breath. EXAM: PORTABLE CHEST 1 VIEW COMPARISON:  June 04, 2015 FINDINGS: There are bilateral pleural effusions with cardiomegaly and moderate interstitial edema. There is bibasilar airspace consolidation as well. There is extensive atherosclerotic change in aorta. Patient is status post coronary artery bypass grafting and aortic valve replacement. No bone lesions. No adenopathy appreciable. IMPRESSION: Congestive heart failure. Airspace consolidation in the bases may represent alveolar edema but also may represent superimposed pneumonia. Both congestive heart failure and pneumonia may present concurrently. Electronically Signed   By: Lowella Grip III M.D.   On: 09/11/2015 17:48      ASSESSMENT AND PLAN: 1. Acute on Chronic Diastolic CHF - symptomatic improvement from yesterday (BNP also down 1681-->1257) - presented with worsening dyspnea, orthopnea, and lower extremity edema over the past two weeks. PO Lasix had been increased to 80mg  daily (previously alternating between 40mg  and 80mg  daily) with no improvement in her symptoms. BNP elevated to 1681. - repeat echo shows  preserved EF of 55-60%, unchanged from previous tracings.  - reports baseline weight of 118 - 120 lbs but has not weighed in months. Was 115 lbs on admission and at this weight yesterday. Still appears volume overloaded on exam. Will need to establish new dry weight. - at least 2 or more L out in last 48 hrs, some inaccuracy with I/O's as stated yesterday. - Was on Lasix 80mg  IV BID and this was increased to lasix 80mg  TID to improve her output. - will likely need to decrease Lasix dosing in the setting of her AKI. For the time being  will continue 80mg  IV BID, but may have to further decrease dose pending creatinine response. Renal function stable over past few days.  - If she quits responding to Lasix, could consider metolazone.  2. CAD - s/p CABG in 2011 with SVG-OM at the time of her AVR. - continue ASA, Procardia, and statin  3. AKI on Stage 3 CKD - baseline creatinine of 1.6 - 1.8. - had been elevated to 2.0 prior to admission in 05/2015. - 2.09 on 09/11/2015, trending upward to 2.24 today - renal US showed medical renal disease. - develops cardiorenal syndrome with aggressive diuresis. (occurred during her last admission )   4. S/P AVR - in 2011 - appears to be functioning normally on echo.     Kate Sable, M.D., F.A.C.C.

## 2015-09-15 DIAGNOSIS — N179 Acute kidney failure, unspecified: Secondary | ICD-10-CM

## 2015-09-15 DIAGNOSIS — I25810 Atherosclerosis of coronary artery bypass graft(s) without angina pectoris: Secondary | ICD-10-CM | POA: Insufficient documentation

## 2015-09-15 DIAGNOSIS — Z952 Presence of prosthetic heart valve: Secondary | ICD-10-CM | POA: Insufficient documentation

## 2015-09-15 LAB — BASIC METABOLIC PANEL
Anion gap: 13 (ref 5–15)
BUN: 29 mg/dL — AB (ref 6–20)
CHLORIDE: 96 mmol/L — AB (ref 101–111)
CO2: 27 mmol/L (ref 22–32)
CREATININE: 2.37 mg/dL — AB (ref 0.44–1.00)
Calcium: 8.5 mg/dL — ABNORMAL LOW (ref 8.9–10.3)
GFR calc Af Amer: 19 mL/min — ABNORMAL LOW (ref >60)
GFR, EST NON AFRICAN AMERICAN: 17 mL/min — AB (ref >60)
GLUCOSE: 105 mg/dL — AB (ref 65–99)
Potassium: 2.9 mmol/L — ABNORMAL LOW (ref 3.5–5.1)
SODIUM: 136 mmol/L (ref 135–145)

## 2015-09-15 MED ORDER — ISOSORBIDE MONONITRATE ER 30 MG PO TB24
15.0000 mg | ORAL_TABLET | Freq: Every day | ORAL | Status: DC
  Administered 2015-09-15 – 2015-09-16 (×2): 15 mg via ORAL
  Filled 2015-09-15 (×2): qty 1

## 2015-09-15 MED ORDER — METOLAZONE 5 MG PO TABS
5.0000 mg | ORAL_TABLET | Freq: Every day | ORAL | Status: AC
  Administered 2015-09-15 – 2015-09-16 (×2): 5 mg via ORAL
  Filled 2015-09-15 (×2): qty 1

## 2015-09-15 NOTE — Progress Notes (Signed)
SUBJECTIVE: Feels well. No chest pain. Says "breathing is about the same as yesterday".     Intake/Output Summary (Last 24 hours) at 09/15/15 1048 Last data filed at 09/15/15 1005  Gross per 24 hour  Intake  944.7 ml  Output   3325 ml  Net -2380.3 ml    Current Facility-Administered Medications  Medication Dose Route Frequency Provider Last Rate Last Dose  . 0.9 %  sodium chloride infusion  250 mL Intravenous PRN Orson Eva, MD      . acetaminophen (TYLENOL) tablet 650 mg  650 mg Oral Q4H PRN Orson Eva, MD      . aspirin EC tablet 81 mg  81 mg Oral Daily Orson Eva, MD   81 mg at 09/15/15 1002  . enoxaparin (LOVENOX) injection 30 mg  30 mg Subcutaneous Q24H Orson Eva, MD   30 mg at 09/14/15 2245  . furosemide (LASIX) injection 80 mg  80 mg Intravenous BID Erma Heritage, [place]   80 mg at 09/15/15 [id number]  . isosorbide mononitrate (IMDUR) 24 hr tablet 15 mg  15 mg Oral Daily [name], MD   15 mg at 09/15/15 1001  . ketotifen (ZADITOR) 0.025 % ophthalmic solution 1 drop  1 drop Both Eyes PRN Orson Eva, MD      . LORazepam (ATIVAN) tablet 0.5 mg  0.5 mg Oral QHS Orson Eva, MD   0.5 mg at 09/14/15 2246  . metolazone (ZAROXOLYN) tablet 5 mg  5 mg Oral Daily [name], MD   5 mg at 09/15/15 1002  . NIFEdipine (PROCARDIA XL/ADALAT-CC) 24 hr tablet 90 mg  90 mg Oral BID Debbe Odea, MD   90 mg at 09/15/15 1002  . ondansetron (ZOFRAN) injection 4 mg  4 mg Intravenous Q6H PRN Orson Eva, MD      . pantoprazole (PROTONIX) EC tablet 40 mg  40 mg Oral Daily Orson Eva, MD   40 mg at 09/15/15 1001  . pravastatin (PRAVACHOL) tablet 40 mg  40 mg Oral q1800 Orson Eva, MD   40 mg at 09/14/15 1726  . sodium chloride 0.9 % injection 3 mL  3 mL Intravenous Q12H Orson Eva, MD   3 mL at 09/14/15 2248  . sodium chloride 0.9 % injection 3 mL  3 mL Intravenous PRN Orson Eva, MD        Filed Vitals:   09/14/15 2011 09/15/15 0538 09/15/15 0900 09/15/15 0959  BP: 173/60 167/51   160/53  Pulse: 79 72 74 70  Temp: 98 F (36.7 C) 97.9 F (36.6 C)    TempSrc: Oral Oral    Resp: 18 20    Height:      Weight:  113 lb 6.4 oz (51.438 kg)    SpO2: 91% 90% 93%     PHYSICAL EXAM General: Pleasant, elderly female in NAD Psych: Normal affect. Neuro: Alert and oriented X 3. Moves all extremities spontaneously. HEENT: Normal Neck: Supple without bruits. JVD elevated. Lungs: Resp regular and unlabored, Rales at bases bilaterally. Heart: RRR no s3, s4, or murmurs. Crisp valve sounds. Abdomen: Soft, non-tender, non-distended, BS + x 4.  Extremities: No clubbing or cyanosis. 1+ pitting edema bilaterally, worse on right. DP/PT/Radials 2+ and equal bilaterally.  TELEMETRY: Reviewed telemetry pt in sinus rhythm with PVC's and PAC's . LABS: Basic Metabolic Panel:  Recent Labs  09/13/15 0651 09/13/15 1025 09/14/15 0506  NA 137  --  136  K 3.0*  --  3.6  CL 100*  --  101  CO2 27  --  26  GLUCOSE 130*  --  114*  BUN 24*  --  25*  CREATININE 2.21*  --  2.24*  CALCIUM 8.0*  --  7.9*  MG  --  2.2  --    Liver Function Tests: No results for input(s): AST, ALT, ALKPHOS, BILITOT, PROT, ALBUMIN in the last 72 hours. No results for input(s): LIPASE, AMYLASE in the last 72 hours. CBC: No results for input(s): WBC, NEUTROABS, HGB, HCT, MCV, PLT in the last 72 hours. Cardiac Enzymes: No results for input(s): CKTOTAL, CKMB, CKMBINDEX, TROPONINI in the last 72 hours. BNP: Invalid input(s): POCBNP D-Dimer:  Recent Labs  09/13/15 1325  DDIMER 0.90*   Hemoglobin A1C: No results for input(s): HGBA1C in the last 72 hours. Fasting Lipid Panel: No results for input(s): CHOL, HDL, LDLCALC, TRIG, CHOLHDL, LDLDIRECT in the last 72 hours. Thyroid Function Tests: No results for input(s): TSH, T4TOTAL, T3FREE, THYROIDAB in the last 72 hours.  Invalid input(s): FREET3 Anemia Panel: No results for input(s): VITAMINB12, FOLATE, FERRITIN, TIBC, IRON, RETICCTPCT in  the last 72 hours.  RADIOLOGY: US Renal  09/13/2015  CLINICAL DATA:  Acute renal insufficiency.  Initial encounter. EXAM: RENAL / URINARY TRACT ULTRASOUND COMPLETE COMPARISON:  None. FINDINGS: Right Kidney: Length: 8.6 cm 1.3 cm cystic lesion upper pole right kidney has some layering calcification or debris. Echogenicity appears mildly increased. No mass or hydronephrosis visualized. Left Kidney: Length: 9.2 cm. Echogenicity appears mildly increased. No mass or hydronephrosis visualized. Simple appearing cysts are identified measuring 1.8 and 2.4 cm respectively. Bladder: Appears normal for degree of bladder distention. IMPRESSION: Mild increased echogenicity could be compatible with medical renal disease. No evidence for hydronephrosis. Electronically Signed   By: Misty Stanley M.D.   On: 09/13/2015 14:40   Dg Chest Portable 1 View  09/11/2015  CLINICAL DATA:  Shortness of breath. EXAM: PORTABLE CHEST 1 VIEW COMPARISON:  June 04, 2015 FINDINGS: There are bilateral pleural effusions with cardiomegaly and moderate interstitial edema. There is bibasilar airspace consolidation as well. There is extensive atherosclerotic change in aorta. Patient is status post coronary artery bypass grafting and aortic valve replacement. No bone lesions. No adenopathy appreciable. IMPRESSION: Congestive heart failure. Airspace consolidation in the bases may represent alveolar edema but also may represent superimposed pneumonia. Both congestive heart failure and pneumonia may present concurrently. Electronically Signed   By: Lowella Grip III M.D.   On: 09/11/2015 17:48      ASSESSMENT AND PLAN: 1. Acute on Chronic Diastolic CHF - symptomatically stable (BNP also down 1681-->1257 on 12/17) - presented with worsening dyspnea, orthopnea, and lower extremity edema over the past two weeks. PO Lasix had been increased to 80mg  daily (previously alternating between 40mg  and 80mg  daily) with no improvement in her  symptoms. BNP elevated to 1681. - repeat echo shows preserved EF of 55-60%, unchanged from previous tracings.  - reports baseline weight of 118 - 120 lbs but has not weighed in months. Was 115 lbs on admission and weights 113 lbs today. Still appears volume overloaded on exam. Will need to establish new dry weight. - at least 1 or more L out in last 24 hrs, some inaccuracy with I/O's as previously mentioned. - Was on Lasix 80mg  IV BID and this was increased to lasix 80mg  TID to improve her output. - will likely need to decrease Lasix dosing in the setting of her AKI. For the time being will  continue 80mg  IV BID, but may have to further decrease dose pending creatinine response. Renal function stable over past few days. Will repeat BMET today. -metolazone added by IM today   2. CAD - symptomatically stable - s/p CABG in 2011 with SVG-OM at the time of her AVR. - continue ASA, Procardia, and statin  3. AKI on Stage 3 CKD - baseline creatinine of 1.6 - 1.8. - had been elevated to 2.0 prior to admission in 05/2015. - 2.09 on 09/11/2015, trending upward to 2.24 yesterday. Will repeat BMET today. - renal US showed medical renal disease. - develops cardiorenal syndrome with aggressive diuresis. (occurred during her last admission )   4. S/P AVR - in 2011 - appears to be functioning normally on echo.  5. Essential HTN Elevated on nifedipine 90 mg bid. Given age, I'd prefer to adjust antihypertensive therapy at this point and continue to monitor while she continues to diurese.   Kate Sable, M.D., F.A.C.C.

## 2015-09-15 NOTE — Progress Notes (Signed)
[facility] PROGRESS NOTE    Progress Note   Victoria Banks [id number] DOB: March 26, 1921 DOA: 09/11/2015 PCP: Mathews Argyle, MD   Brief Narrative:   Victoria Banks is an 81 y.o. female past medical history of coronary artery sees, by a prostatic valve comes into the hospital for dyspnea, her furosemide was recently increased from 40-80  Assessment/Plan:   Acute respiratory failure with hypoxia ([place]) due to Acute on chronic diastolic CHF (congestive heart failure) (HCC)/Cardiorenal syndrome: Echocardiogram on 07/18/2015 showed a grade 2 diastolic heart failure. She seems to be fluid overloaded. She is on Lasix 80 mg and metolazone with improved diuresis. She refused BiDil due to the hydralazine component which causes her to have cough. Started on in color.  Aortic valve disorder: 2-D echo showing mild aortic stenosis.  Essential hypertension, benign: Currently elevated, likely due to volume overload. I hope that with diuresis her blood pressure will improve. She refused her BiDil, started on Imdur.  Acute kidney injury on CKD (chronic kidney disease) stage 4, GFR 15-29 ml/min ([place]): Baseline creatinine is 1.8-1.9. Her creatinine continues to worsen despite IV diuresis. This likely due to cardiorenal syndrome. Continue IV diuresis at metolazone.    DVT Prophylaxis - Lovenox ordered.  Family Communication:none Disposition Plan: Home in 2-3 days Code Status:     Code Status Orders        Start     Ordered   09/11/15 2053  Full code   Continuous     09/11/15 2052    Advance Directive Documentation        Most Recent Value   Type of Advance Directive  Living will   Pre-existing out of facility DNR order (yellow form or pink MOST form)     "MOST" Form in Place?          IV Access:    Peripheral IV   Procedures and diagnostic studies:   US Renal  09/13/2015  CLINICAL DATA:  Acute renal insufficiency.  Initial encounter. EXAM: RENAL /  URINARY TRACT ULTRASOUND COMPLETE COMPARISON:  None. FINDINGS: Right Kidney: Length: 8.6 cm 1.3 cm cystic lesion upper pole right kidney has some layering calcification or debris. Echogenicity appears mildly increased. No mass or hydronephrosis visualized. Left Kidney: Length: 9.2 cm. Echogenicity appears mildly increased. No mass or hydronephrosis visualized. Simple appearing cysts are identified measuring 1.8 and 2.4 cm respectively. Bladder: Appears normal for degree of bladder distention. IMPRESSION: Mild increased echogenicity could be compatible with medical renal disease. No evidence for hydronephrosis. Electronically Signed   By: Misty Stanley M.D.   On: 09/13/2015 14:40     Medical Consultants:    None.  Anti-Infectives:   Anti-infectives    None      Subjective:    Victoria Banks she relates she is still short of breath only mildly improved.  Objective:    Filed Vitals:   09/14/15 0400 09/14/15 1225 09/14/15 2011 09/15/15 0538  BP: 141/58 157/73 173/60 167/51  Pulse: 77 79 79 72  Temp: 97.8 F (36.6 C) 97.8 F (36.6 C) 98 F (36.7 C) 97.9 F (36.6 C)  TempSrc: Oral Oral Oral Oral  Resp: 18 18 18 20   Height:      Weight: 53.2 kg (117 lb 4.6 oz)   51.438 kg (113 lb 6.4 oz)  SpO2: 90% 95% 91% 90%    Intake/Output Summary (Last 24 hours) at 09/15/15 0916 Last data filed at 09/15/15 0900  Gross per 24 hour  Intake  944.7 ml  Output   3275 ml  Net -2330.3 ml   Filed Weights   09/13/15 0432 09/14/15 0400 09/15/15 0538  Weight: 52.345 kg (115 lb 6.4 oz) 53.2 kg (117 lb 4.6 oz) 51.438 kg (113 lb 6.4 oz)    Exam: Gen:  NAD Cardiovascular:  RRR. Positive JVD Chest and lungs:   Good air movement with crackles bilaterally halfway up her lungs. Abdomen:  Abdomen soft, NT/ND, + BS Extremities: 2+ lower extremity edema   Data Reviewed:    Labs: Basic Metabolic Panel:  Recent Labs Lab 09/11/15 1715 09/12/15 0555 09/13/15 0651 09/13/15 1025  09/14/15 0506  NA 136 138 137  --  136  K 4.9 3.7 3.0*  --  3.6  CL 102 103 100*  --  101  CO2 23 25 27   --  26  GLUCOSE 125* 108* 130*  --  114*  BUN 25* 21* 24*  --  25*  CREATININE 2.09* 2.11* 2.21*  --  2.24*  CALCIUM 9.0 8.5* 8.0*  --  7.9*  MG  --   --   --  2.2  --    GFR Estimated Creatinine Clearance: 12.1 mL/min (by C-G formula based on Cr of 2.24). Liver Function Tests:  Recent Labs Lab 09/11/15 1715  AST 23  ALT 16  ALKPHOS 51  BILITOT 0.5  PROT 6.7  ALBUMIN 3.2*   No results for input(s): LIPASE, AMYLASE in the last 168 hours. No results for input(s): AMMONIA in the last 168 hours. Coagulation profile No results for input(s): INR, PROTIME in the last 168 hours.  CBC:  Recent Labs Lab 09/11/15 1715  WBC 8.3  NEUTROABS 6.2  HGB 11.7*  HCT 36.0  MCV 87.8  PLT 253   Cardiac Enzymes: No results for input(s): CKTOTAL, CKMB, CKMBINDEX, TROPONINI in the last 168 hours. BNP (last 3 results) No results for input(s): PROBNP in the last 8760 hours. CBG: No results for input(s): GLUCAP in the last 168 hours. D-Dimer:  Recent Labs  09/13/15 1325  DDIMER 0.90*   Hgb A1c: No results for input(s): HGBA1C in the last 72 hours. Lipid Profile: No results for input(s): CHOL, HDL, LDLCALC, TRIG, CHOLHDL, LDLDIRECT in the last 72 hours. Thyroid function studies: No results for input(s): TSH, T4TOTAL, T3FREE, THYROIDAB in the last 72 hours.  Invalid input(s): FREET3 Anemia work up: No results for input(s): VITAMINB12, FOLATE, FERRITIN, TIBC, IRON, RETICCTPCT in the last 72 hours. Sepsis Labs:  Recent Labs Lab 09/11/15 1715  WBC 8.3   Microbiology No results found for this or any previous visit (from the past 240 hour(s)).   Medications:   . aspirin EC  81 mg Oral Daily  . enoxaparin (LOVENOX) injection  30 mg Subcutaneous Q24H  . furosemide  80 mg Intravenous BID  . isosorbide-hydrALAZINE  1 tablet Oral BID  . LORazepam  0.5 mg Oral QHS  .  NIFEdipine  90 mg Oral BID  . pantoprazole  40 mg Oral Daily  . pravastatin  40 mg Oral q1800  . sodium chloride  3 mL Intravenous Q12H   Continuous Infusions:   Time spent: 25 min   LOS: 4 days   [name]  [facility] Pager [phone]  *Please refer to [email or web address], password [email or web address] to get updated schedule on who will round on this patient, as hospitalists switch teams weekly. If 7PM-7AM, please contact night-coverage at [email or web address], password [email or web address] for any overnight needs.  09/15/2015, 9:16 AM

## 2015-09-16 DIAGNOSIS — I359 Nonrheumatic aortic valve disorder, unspecified: Secondary | ICD-10-CM

## 2015-09-16 MED ORDER — POLYETHYLENE GLYCOL 3350 17 G PO PACK
17.0000 g | PACK | Freq: Every day | ORAL | Status: DC
  Administered 2015-09-16: 17 g via ORAL
  Filled 2015-09-16 (×4): qty 1

## 2015-09-16 MED ORDER — MAGNESIUM HYDROXIDE 400 MG/5ML PO SUSP
30.0000 mL | Freq: Every day | ORAL | Status: DC | PRN
  Administered 2015-09-16: 30 mL via ORAL
  Filled 2015-09-16 (×2): qty 30

## 2015-09-16 MED ORDER — ISOSORBIDE MONONITRATE ER 30 MG PO TB24
30.0000 mg | ORAL_TABLET | Freq: Every day | ORAL | Status: DC
  Administered 2015-09-17: 30 mg via ORAL
  Filled 2015-09-16: qty 1

## 2015-09-16 MED ORDER — POTASSIUM CHLORIDE CRYS ER 20 MEQ PO TBCR
40.0000 meq | EXTENDED_RELEASE_TABLET | Freq: Two times a day (BID) | ORAL | Status: AC
  Administered 2015-09-16 (×2): 40 meq via ORAL
  Filled 2015-09-16 (×2): qty 2

## 2015-09-16 NOTE — Progress Notes (Signed)
[facility] PROGRESS NOTE    Progress Note   Victoria Banks [id number] DOB: January 15, 1921 DOA: 09/11/2015 PCP: Mathews Argyle, MD   Brief Narrative:   Victoria Banks is an 81 y.o. female past medical history of coronary artery sees, by a prostatic valve comes into the hospital for dyspnea, her furosemide was recently increased from 40-80  Assessment/Plan:   Acute respiratory failure with hypoxia ([place]) due to Acute on chronic diastolic CHF (congestive heart failure) (HCC)/Cardiorenal syndrome: Appears to be significant fluid overloaded despite good diuresis, her weight has not improve with diuresis. She is on Lasix 80 mg IV twice a day plus metolazone Her creatinine has remained stable, continue to monitor daily. Repeat electrolyte as needed. Blood pressure high continues titrate Imdur. Unknown estimated dry weight. Place ted hose. She relates her breathing is much better today, she has been taken off oxygen saturations have remained greater than 90%. She still fluid overloaded has JVD and bilateral crackles.  Aortic valve disorder: 2-D echo showing mild aortic stenosis.  Essential hypertension, benign: Currently elevated likely due to volume overload. Blood pressure not improving, despite diuresis Imdur and Nifedipine  Acute kidney injury on CKD (chronic kidney disease) stage 4, GFR 15-29 ml/min ([place]): Baseline creatinine is 1.8-1.9. Continue IV Lasix at low-dose metolazone will discuss with cardiology.  Hyperkalemia: Replete orally recheck a basic metabolic panel in the morning.    DVT Prophylaxis - Lovenox ordered.  Family Communication:none Disposition Plan: Home in 2 days Code Status:     Code Status Orders        Start     Ordered   09/11/15 2053  Full code   Continuous     09/11/15 2052    Advance Directive Documentation        Most Recent Value   Type of Advance Directive  Living will   Pre-existing out of facility DNR order (yellow form  or pink MOST form)     "MOST" Form in Place?          IV Access:    Peripheral IV   Procedures and diagnostic studies:   No results found.   Medical Consultants:    None.  Anti-Infectives:   Anti-infectives    None      Subjective:    Victoria Banks she relates shortness of breath is significantly improved today.  Objective:    Filed Vitals:   09/15/15 1100 09/15/15 2150 09/16/15 0444 09/16/15 1003  BP: 142/72 140/68 162/71 167/57  Pulse: 72 69 77 71  Temp: 97.6 F (36.4 C) 97.4 F (36.3 C) 98 F (36.7 C)   TempSrc: Oral Oral Oral   Resp: 20 19 16    Height:      Weight:   51.48 kg (113 lb 7.9 oz)   SpO2: 97% 98% 92%     Intake/Output Summary (Last 24 hours) at 09/16/15 1023 Last data filed at 09/16/15 1004  Gross per 24 hour  Intake   1475 ml  Output   2550 ml  Net  -1075 ml   Filed Weights   09/14/15 0400 09/15/15 0538 09/16/15 0444  Weight: 53.2 kg (117 lb 4.6 oz) 51.438 kg (113 lb 6.4 oz) 51.48 kg (113 lb 7.9 oz)    Exam: Gen:  NAD Cardiovascular:  RRR. Positive JVD Chest and lungs:   Good air movement , still with bilateral lower lobe crackles. Abdomen:  Abdomen soft, NT/ND, + BS Extremities:  3+ edema   Data Reviewed:  Labs: Basic Metabolic Panel:  Recent Labs Lab 09/11/15 1715 09/12/15 0555 09/13/15 0651 09/13/15 1025 09/14/15 0506 09/15/15 1120  NA 136 138 137  --  136 136  K 4.9 3.7 3.0*  --  3.6 2.9*  CL 102 103 100*  --  101 96*  CO2 23 25 27   --  26 27  GLUCOSE 125* 108* 130*  --  114* 105*  BUN 25* 21* 24*  --  25* 29*  CREATININE 2.09* 2.11* 2.21*  --  2.24* 2.37*  CALCIUM 9.0 8.5* 8.0*  --  7.9* 8.5*  MG  --   --   --  2.2  --   --    GFR Estimated Creatinine Clearance: 11.4 mL/min (by C-G formula based on Cr of 2.37). Liver Function Tests:  Recent Labs Lab 09/11/15 1715  AST 23  ALT 16  ALKPHOS 51  BILITOT 0.5  PROT 6.7  ALBUMIN 3.2*   No results for input(s): LIPASE, AMYLASE in the last  168 hours. No results for input(s): AMMONIA in the last 168 hours. Coagulation profile No results for input(s): INR, PROTIME in the last 168 hours.  CBC:  Recent Labs Lab 09/11/15 1715  WBC 8.3  NEUTROABS 6.2  HGB 11.7*  HCT 36.0  MCV 87.8  PLT 253   Cardiac Enzymes: No results for input(s): CKTOTAL, CKMB, CKMBINDEX, TROPONINI in the last 168 hours. BNP (last 3 results) No results for input(s): PROBNP in the last 8760 hours. CBG: No results for input(s): GLUCAP in the last 168 hours. D-Dimer:  Recent Labs  09/13/15 1325  DDIMER 0.90*   Hgb A1c: No results for input(s): HGBA1C in the last 72 hours. Lipid Profile: No results for input(s): CHOL, HDL, LDLCALC, TRIG, CHOLHDL, LDLDIRECT in the last 72 hours. Thyroid function studies: No results for input(s): TSH, T4TOTAL, T3FREE, THYROIDAB in the last 72 hours.  Invalid input(s): FREET3 Anemia work up: No results for input(s): VITAMINB12, FOLATE, FERRITIN, TIBC, IRON, RETICCTPCT in the last 72 hours. Sepsis Labs:  Recent Labs Lab 09/11/15 1715  WBC 8.3   Microbiology No results found for this or any previous visit (from the past 240 hour(s)).   Medications:   . aspirin EC  81 mg Oral Daily  . enoxaparin (LOVENOX) injection  30 mg Subcutaneous Q24H  . furosemide  80 mg Intravenous BID  . isosorbide mononitrate  15 mg Oral Daily  . LORazepam  0.5 mg Oral QHS  . NIFEdipine  90 mg Oral BID  . pantoprazole  40 mg Oral Daily  . pravastatin  40 mg Oral q1800  . sodium chloride  3 mL Intravenous Q12H   Continuous Infusions:   Time spent: 25 min   LOS: 5 days   [name]  [facility] Pager [phone]  *Please refer to [email or web address], password [email or web address] to get updated schedule on who will round on this patient, as hospitalists switch teams weekly. If 7PM-7AM, please contact night-coverage at [email or web address], password [email or web address] for any overnight needs.  09/16/2015, 10:23 AM

## 2015-09-16 NOTE — Evaluation (Signed)
Occupational Therapy Evaluation Patient Details Name: Victoria Banks MRN: [id number] DOB: [birth year] Today's Date: 09/16/2015    History of Present Illness 81 year old female with a history of hypertension, hyperlipidemia, coronary artery disease, bioprosthetic aortic valve and diastolic CHF presented with one-week history of worsening shortness of breath.   Clinical Impression   Patient presenting with decreased ADL and functional mobility independence secondary to above. Patient independent to mod I PTA, not using a RW for mobility. Patient currently functioning at an overall supervision to min guard assist level. Patient will benefit from acute OT to increase overall independence in the areas of ADLs, functional mobility, and overall safety in order to safely discharge home with 24/7 available, pt living in retirement community.     Follow Up Recommendations  No OT follow up;Supervision - Intermittent    Equipment Recommendations  None recommended by OT    Recommendations for Other Services  None at this time    Precautions / Restrictions Precautions Precautions: Fall Restrictions Weight Bearing Restrictions: No   Mobility Bed Mobility General bed mobility comments: Pt in chair on OT arrival  Transfers Overall transfer level: Needs assistance Equipment used: Rolling walker (2 wheeled) Transfers: Sit to/from Stand Sit to Stand: Supervision;Min guard General transfer comment: Supervision to min guard for safety, cues for safey and technique     Balance Overall balance assessment: Needs assistance Sitting-balance support: No upper extremity supported;Feet supported Sitting balance-Leahy Scale: Good     Standing balance support: Bilateral upper extremity supported;During functional activity Standing balance-Leahy Scale: Fair    ADL Overall ADL's : Needs assistance/impaired General ADL Comments: Pt overall supervision, except needing some assistance with LB ADLs. Pt  using RW for safety and to decrease risk of fall. Pt has not used a RW before. Pt with difficulty crossing and reaching BLEs for LB ADLs. Would like to introduced and go over AE to help increase independence and safety with this. Patient's daughter present from [place].     Pertinent Vitals/Pain Pain Assessment: No/denies pain     Hand Dominance Right   Extremity/Trunk Assessment Upper Extremity Assessment Upper Extremity Assessment: Overall WFL for tasks assessed   Lower Extremity Assessment Lower Extremity Assessment: Generalized weakness   Cervical / Trunk Assessment Cervical / Trunk Assessment: Kyphotic   Communication Communication Communication: No difficulties   Cognition Arousal/Alertness: Awake/alert Behavior During Therapy: WFL for tasks assessed/performed Overall Cognitive Status: Within Functional Limits for tasks assessed              Home Living Family/patient expects to be discharged to:: Private residence Living Arrangements: Alone Available Help at Discharge: Available PRN/intermittently Type of Home: Independent living facility Home Access: Level entry     Home Layout: One level     Bathroom Shower/Tub: Occupational psychologist: Handicapped height     Home Equipment: Shower seat - built in   Additional Comments: Pt drives. Has one meal per day in the dining room and prepares the rest of meals.      Prior Functioning/Environment Level of Independence: Independent     OT Diagnosis: Generalized weakness   OT Problem List: Decreased strength;Decreased activity tolerance;Impaired balance (sitting and/or standing);Decreased safety awareness   OT Treatment/Interventions: Self-care/ADL training;Therapeutic exercise;Energy conservation;DME and/or AE instruction;Therapeutic activities;Patient/family education;Balance training    OT Goals(Current goals can be found in the care plan section) Acute Rehab OT Goals Patient Stated Goal: go home  alone OT Goal Formulation: With patient/family Time For Goal Achievement: 09/30/15 Potential to Achieve Goals:  Good ADL Goals Pt Will Perform Grooming: with modified independence;standing Pt Will Perform Lower Body Bathing: with modified independence;sit to/from stand (using AE prn) Pt Will Perform Lower Body Dressing: with modified independence;sit to/from stand (using AE prn) Pt Will Transfer to Toilet: with modified independence;ambulating Pt Will Perform Tub/Shower Transfer: Shower transfer;ambulating;shower seat;rolling walker;with modified independence Additional ADL Goal #1: Pt will be educated on energy conservation techniques and be able to verbalize at least 3 energy conservation techniques  Additional ADL Goal #2: pt will be mod I with functional mobility using LRAD prn  OT Frequency: Min 2X/week   Barriers to D/C: none known at this time    End of Session Equipment Utilized During Treatment: Gait belt;Rolling walker  Activity Tolerance: Patient tolerated treatment well Patient left: in chair;with call bell/phone within reach;with chair alarm set;with family/visitor present   Time: [id number] OT Time Calculation (min): 21 min Charges:  OT General Charges $OT Visit: 1 Procedure OT Evaluation $Initial OT Evaluation Tier I: 1 Procedure  Chrys Racer , MS, OTR/L, CLT Pager: [phone]  09/16/2015, 1:02 PM

## 2015-09-16 NOTE — Care Management Important Message (Signed)
Important Message  Patient Details  Name: Victoria Banks MRN: [id number] Date of Birth: 11/05/1920   Medicare Important Message Given:  Yes    Anubis Fundora P [place] 09/16/2015, 3:11 PM

## 2015-09-16 NOTE — Progress Notes (Signed)
Patient Name: Victoria Banks Date of Encounter: 09/16/2015  Principal Problem:   Acute on chronic diastolic CHF (congestive heart failure) ([place]) Active Problems:   Aortic valve disorder   Essential hypertension, benign   Acute respiratory failure with hypoxia (HCC)   CKD (chronic kidney disease) stage 4, GFR 15-29 ml/min (HCC)   AKI (acute kidney injury) ([place])   Coronary artery disease involving bypass graft of transplanted heart without angina pectoris   S/P AVR     Primary Cardiologist: Dr. Acie Fredrickson Patient Profile: 81 y.o. female w/ PMH of chronic diastolic CHF, HTN, CAD (s/p CABG in 2011 with SVG-OM), s/p AVR in 2011, and Stage 3 CKD (baseline 1.6-1.8) who presented to [facility] ED on 09/11/2015 for worsening dyspnea over the past two weeks.  SUBJECTIVE: Reports significant improvement in her dyspnea and lower extremity edema since being admitted. Weaned from O2 this morning.  OBJECTIVE Filed Vitals:   09/15/15 1100 09/15/15 2150 09/16/15 0444 09/16/15 1003  BP: 142/72 140/68 162/71 167/57  Pulse: 72 69 77 71  Temp: 97.6 F (36.4 C) 97.4 F (36.3 C) 98 F (36.7 C)   TempSrc: Oral Oral Oral   Resp: 20 19 16    Height:      Weight:   113 lb 7.9 oz (51.48 kg)   SpO2: 97% 98% 92%     Intake/Output Summary (Last 24 hours) at 09/16/15 1142 Last data filed at 09/16/15 1004  Gross per 24 hour  Intake   1355 ml  Output   2300 ml  Net   -945 ml   Filed Weights   09/14/15 0400 09/15/15 0538 09/16/15 0444  Weight: 117 lb 4.6 oz (53.2 kg) 113 lb 6.4 oz (51.438 kg) 113 lb 7.9 oz (51.48 kg)    PHYSICAL EXAM General: Well developed, well nourished, female in no acute distress. Head: Normocephalic, atraumatic.  Neck: Supple without bruits, JVD not elevated. Lungs:  Resp regular and unlabored, Minimal rales at bases. Heart: RRR, S1, S2, no S3, S4, or murmur; no rub. Crisp valve sounds Abdomen: Soft, non-tender, non-distended with normoactive bowel sounds. No  hepatomegaly. No rebound/guarding. No obvious abdominal masses. Extremities: No clubbing or cyanosis, trace edema. Distal pedal pulses are 2+ bilaterally. Neuro: Alert and oriented X 3. Moves all extremities spontaneously. Psych: Normal affect.   LABS: Basic Metabolic Panel: Recent Labs  09/14/15 0506 09/15/15 1120  NA 136 136  K 3.6 2.9*  CL 101 96*  CO2 26 27  GLUCOSE 114* 105*  BUN 25* 29*  CREATININE 2.24* 2.37*  CALCIUM 7.9* 8.5*   BNP:  B NATRIURETIC PEPTIDE  Date/Time Value Ref Range Status  09/14/2015 05:06 AM 1257.4* 0.0 - 100.0 pg/mL Final  09/11/2015 05:15 PM 1681.0* 0.0 - 100.0 pg/mL Final   D-dimer: Recent Labs  09/13/15 1325  DDIMER 0.90*    TELE:   NSR with rate in 70's - 80's. No atopic events.     ECHO: 09/12/2015 Study Conclusions - Left ventricle: The cavity size was normal. Wall thickness was increased in a pattern of mild LVH. Systolic function was normal. The estimated ejection fraction was in the range of 55% to 60%. Wall motion was normal; there were no regional wall motion abnormalities. - Aortic valve: A bioprosthesis was present and functioning normally. There was very mild stenosis. - Mitral valve: There was mild regurgitation. - Pulmonary arteries: PA peak pressure: 43 mm Hg (S). - Pericardium, extracardiac: There was a left pleural effusion.   Current Medications:  .  aspirin EC  81 mg Oral Daily  . enoxaparin (LOVENOX) injection  30 mg Subcutaneous Q24H  . furosemide  80 mg Intravenous BID  . [START ON 09/17/2015] isosorbide mononitrate  30 mg Oral Daily  . LORazepam  0.5 mg Oral QHS  . NIFEdipine  90 mg Oral BID  . pantoprazole  40 mg Oral Daily  . polyethylene glycol  17 g Oral Daily  . potassium chloride  40 mEq Oral BID  . pravastatin  40 mg Oral q1800  . sodium chloride  3 mL Intravenous Q12H      ASSESSMENT AND PLAN: 1. Acute on Chronic Diastolic CHF - presented with worsening dyspnea, orthopnea, and  lower extremity edema over the past two weeks. PO Lasix had been increased to 80mg  daily (previously alternating between 40mg  and 80mg  daily) with no improvement in her symptoms. BNP elevated to 1681 at time of admission. - repeat echo shows preserved EF of 55-60%, unchanged from previous study.  - reports baseline weight of 118 - 120lbs but has not weighed in months. Was 115 lbs on admission. At 113lbs on 09/16/2015 - Her net output is recorded as -5.7L. Has been having episodes of incontinence so this is likely higher. Net output yesterday was -2.1L. - Continue IV Lasix 80mg  BID. Receiving Metolazone 5mg  daily as well. With increasing creatinine, may need to hold Metolazone tomorrow. Can likely switch to PO Lasix in the next 24 - 48 hours.  2. CAD - s/p CABG in 2011 with SVG-OM at the time of her AVR. - continue ASA, Procardia, and statin  3. AKI on Stage 3 CKD - baseline creatinine of 1.6 - 1.8. Renal US showed medical renal disease with no hydronephrosis. - had been elevated to 2.0 prior to admission in 05/2015. - trending upward to 2.37 on 09/16/2015.  4. S/P AVR - in 2011 - appears to be functioning normally on echo.  5. Essential HTN - BP has been 140/57 - 167/71 in the past 24 hours. Refused BiDil due to Hydralazine making her cough. Imdur 30mg  daily scheduled to start today.   Arna Medici , PA-C 11:42 AM 09/16/2015 Pager: [phone] Patient seen and examined and history reviewed. Agree with above findings and plan. The patient continues to improve with good diuresis. Weight down to 113. Lungs clear. Mild ankle edema. I think she is getting close to dry weight. Anticipate changing lasix to po tomorrow. If creatinine continues to rise may need to hold metolazone. Need to replete potassium.   Peter [place], [place] 09/16/2015 1:04 PM

## 2015-09-17 LAB — BASIC METABOLIC PANEL
ANION GAP: 10 (ref 5–15)
BUN: 40 mg/dL — ABNORMAL HIGH (ref 6–20)
CHLORIDE: 96 mmol/L — AB (ref 101–111)
CO2: 31 mmol/L (ref 22–32)
Calcium: 8.7 mg/dL — ABNORMAL LOW (ref 8.9–10.3)
Creatinine, Ser: 2.45 mg/dL — ABNORMAL HIGH (ref 0.44–1.00)
GFR calc non Af Amer: 16 mL/min — ABNORMAL LOW (ref >60)
GFR, EST AFRICAN AMERICAN: 19 mL/min — AB (ref >60)
Glucose, Bld: 153 mg/dL — ABNORMAL HIGH (ref 65–99)
Potassium: 3.4 mmol/L — ABNORMAL LOW (ref 3.5–5.1)
Sodium: 137 mmol/L (ref 135–145)

## 2015-09-17 MED ORDER — ISOSORBIDE MONONITRATE ER 60 MG PO TB24
60.0000 mg | ORAL_TABLET | Freq: Every day | ORAL | Status: DC
  Administered 2015-09-18 – 2015-09-19 (×2): 60 mg via ORAL
  Filled 2015-09-17 (×2): qty 1

## 2015-09-17 MED ORDER — MAGNESIUM HYDROXIDE 400 MG/5ML PO SUSP
5.0000 mL | ORAL | Status: DC
  Filled 2015-09-17 (×2): qty 30

## 2015-09-17 NOTE — Progress Notes (Signed)
Patient Name: Victoria Banks Date of Encounter: 09/17/2015  Principal Problem:   Acute on chronic diastolic CHF (congestive heart failure) ([place]) Active Problems:   Aortic valve disorder   Essential hypertension, benign   Acute respiratory failure with hypoxia (HCC)   CKD (chronic kidney disease) stage 4, GFR 15-29 ml/min (HCC)   AKI (acute kidney injury) ([place])   Coronary artery disease involving bypass graft of transplanted heart without angina pectoris   S/P AVR     Primary Cardiologist: Dr. Acie Fredrickson Patient Profile: 81 y.o. female w/ PMH of chronic diastolic CHF, HTN, CAD (s/p CABG in 2011 with SVG-OM), s/p AVR in 2011, and Stage 3 CKD (baseline 1.6-1.8) who presented to [facility] ED on 09/11/2015 for worsening dyspnea over the past two weeks.  SUBJECTIVE: Reports significant improvement in her dyspnea and lower extremity edema since being admitted. off O2.  OBJECTIVE Filed Vitals:   09/16/15 2036 09/17/15 0353 09/17/15 1004 09/17/15 1206  BP: 180/74 150/55 158/57 154/63  Pulse: 78 77 71 73  Temp: 97.8 F (36.6 C) 98 F (36.7 C)  97.8 F (36.6 C)  TempSrc: Oral Oral  Oral  Resp: 18 18  19   Height:      Weight:  53.615 kg (118 lb 3.2 oz)    SpO2: 92% 92%  93%    Intake/Output Summary (Last 24 hours) at 09/17/15 1354 Last data filed at 09/17/15 1340  Gross per 24 hour  Intake    600 ml  Output   1201 ml  Net   -601 ml   Filed Weights   09/15/15 0538 09/16/15 0444 09/17/15 0353  Weight: 51.438 kg (113 lb 6.4 oz) 51.48 kg (113 lb 7.9 oz) 53.615 kg (118 lb 3.2 oz)    PHYSICAL EXAM General: Well developed, well nourished, female in no acute distress. Head: Normocephalic, atraumatic.  Neck: Supple without bruits, JVD not elevated. Lungs:  Resp regular and unlabored, Minimal rales at bases. Heart: RRR, S1, S2, no S3, S4, or murmur; no rub. Crisp valve sounds Abdomen: Soft, non-tender, non-distended with normoactive bowel sounds. No hepatomegaly. No  rebound/guarding. No obvious abdominal masses. Extremities: No clubbing or cyanosis, 1+ edema. Distal pedal pulses are 2+ bilaterally. Neuro: Alert and oriented X 3. Moves all extremities spontaneously. Psych: Normal affect.   LABS: Basic Metabolic Panel:  Recent Labs  09/15/15 1120 09/17/15 1117  NA 136 137  K 2.9* 3.4*  CL 96* 96*  CO2 27 31  GLUCOSE 105* 153*  BUN 29* 40*  CREATININE 2.37* 2.45*  CALCIUM 8.5* 8.7*   BNP:  B NATRIURETIC PEPTIDE  Date/Time Value Ref Range Status  09/14/2015 05:06 AM 1257.4* 0.0 - 100.0 pg/mL Final  09/11/2015 05:15 PM 1681.0* 0.0 - 100.0 pg/mL Final   D-dimer:No results for input(s): DDIMER in the last 72 hours.  TELE:   NSR with rate in 70's - 80's. No atopic events.     ECHO: 09/12/2015 Study Conclusions - Left ventricle: The cavity size was normal. Wall thickness was increased in a pattern of mild LVH. Systolic function was normal. The estimated ejection fraction was in the range of 55% to 60%. Wall motion was normal; there were no regional wall motion abnormalities. - Aortic valve: A bioprosthesis was present and functioning normally. There was very mild stenosis. - Mitral valve: There was mild regurgitation. - Pulmonary arteries: PA peak pressure: 43 mm Hg (S). - Pericardium, extracardiac: There was a left pleural effusion.   Current Medications:  .  aspirin EC  81 mg Oral Daily  . enoxaparin (LOVENOX) injection  30 mg Subcutaneous Q24H  . furosemide  80 mg Intravenous BID  . isosorbide mononitrate  30 mg Oral Daily  . LORazepam  0.5 mg Oral QHS  . [START ON 09/18/2015] magnesium hydroxide  5 mL Oral QODAY  . NIFEdipine  90 mg Oral BID  . pantoprazole  40 mg Oral Daily  . polyethylene glycol  17 g Oral Daily  . pravastatin  40 mg Oral q1800  . sodium chloride  3 mL Intravenous Q12H      ASSESSMENT AND PLAN: 1. Acute on Chronic Diastolic CHF - presented with worsening dyspnea, orthopnea, and lower extremity  edema over the past two weeks. PO Lasix had been increased to 80mg  daily (previously alternating between 40mg  and 80mg  daily) with no improvement in her symptoms. BNP elevated to 1681 at time of admission. - repeat echo shows preserved EF of 55-60%, unchanged from previous study.  - reports baseline weight of 118 - 120lbs but has not weighed in months. Was 115 lbs on admission. At 113lbs on 09/16/2015. Back up to 118 today? - Her net output is recorded as -6.3L.  Net output yesterday was -1.0 L. - Continue IV Lasix 80mg  BID today. Metolazone held. Some increase in BUN. I suspect she is close to dry weight. Can likely switch to PO Lasix tomorrow.  2. CAD - s/p CABG in 2011 with SVG-OM at the time of her AVR. - continue ASA, Procardia, and statin  3. AKI on Stage 3 CKD - baseline creatinine of 1.6 - 1.8. Renal US showed medical renal disease with no hydronephrosis. - had been elevated to 2.0 prior to admission in 05/2015. - trending upward to 2.45on 09/16/2015.  4. S/P AVR - in 2011 - appears to be functioning normally on echo.  5. Essential HTN - BP has been 140/57 - 167/71 in the past 24 hours. Refused BiDil due to Hydralazine making her cough. On Nifedipine 90 mg daily. Will increase Imdur 60 mg daily scheduled to start today.   Signed,  Sheryle Vice [place], [place] 09/17/2015 1:54 PM

## 2015-09-17 NOTE — Progress Notes (Addendum)
Physical Therapy Treatment Patient Details Name: Victoria Banks MRN: [id number] DOB: September 12, 1921 Today's Date: 09/17/2015    History of Present Illness 81 year old female with a history of hypertension, hyperlipidemia, coronary artery disease, bioprosthetic aortic valve and diastolic CHF presented with one-week history of worsening shortness of breath.    PT Comments    Pt is demonstrating some need for supervision with gait but will be much safer if she agrees to stay with RW at home.  Pt is reluctant but agreeable with conversation about safety.  Have also talked with her about monitoring O2 sats and the fluid of her LE's at the top of support stockings.  Follow Up Recommendations  Home health PT;Supervision/Assistance - 24 hour     Equipment Recommendations  Rolling walker with 5" wheels    Recommendations for Other Services       Precautions / Restrictions Precautions Precautions: Fall Restrictions Weight Bearing Restrictions: No    Mobility  Bed Mobility Overal bed mobility: Modified Independent             General bed mobility comments: up when PT entered  Transfers Overall transfer level: Modified independent Equipment used: Rolling walker (2 wheeled) Transfers: Sit to/from Stand Sit to Stand: Modified independent (Device/Increase time)            Ambulation/Gait Ambulation/Gait assistance: Supervision (for safety on RW) Ambulation Distance (Feet): 175 Feet Assistive device: Rolling walker (2 wheeled) Gait Pattern/deviations: Step-through pattern;Narrow base of support;Drifts right/left;Trunk flexed (slow wide turns on walker) Gait velocity: decreased Gait velocity interpretation: Below normal speed for age/gender General Gait Details: O2 sats were 92% for pre and post gait   Stairs            Wheelchair Mobility    Modified Rankin (Stroke Patients Only)       Balance Overall balance assessment: Needs assistance Sitting-balance  support: Feet supported Sitting balance-Leahy Scale: Good   Postural control: Posterior lean Standing balance support: Bilateral upper extremity supported Standing balance-Leahy Scale: Fair Standing balance comment: fair- with dynamic work and needs RW                    Cognition Arousal/Alertness: Awake/alert Behavior During Therapy: WFL for tasks assessed/performed Overall Cognitive Status: Within Functional Limits for tasks assessed                      Exercises      General Comments General comments (skin integrity, edema, etc.): Pt set the walker aside in her room and immediately began to reach for the furniture.  talked with her about keeping RW with her, and to check her O2 sats to be sure she is not running low as she is down the scale of normal.  Has new TED stockings and talked with her about monitoring the top of her legs for fluid accumulation and that longer stockings might be needed if the fluid collects there.      Pertinent Vitals/Pain Pain Assessment: No/denies pain    Home Living                      Prior Function            PT Goals (current goals can now be found in the care plan section) Acute Rehab PT Goals Patient Stated Goal: go home alone Progress towards PT goals: Progressing toward goals    Frequency  Min 2X/week    PT Plan Current plan remains  appropriate    Co-evaluation             End of Session Equipment Utilized During Treatment: Gait belt Activity Tolerance: Patient tolerated treatment well Patient left: in chair;with call bell/phone within reach;with family/visitor present;Other (comment) (CNA discontinued chair alarm)     Time: [id number] PT Time Calculation (min) (ACUTE ONLY): 16 min  Charges:  $Gait Training: 8-22 mins                    G Codes:      Ramond Dial Oct 08, 2015, 11:32 AM   Mee Hives, PT MS Acute Rehab Dept. Number: [phone] [id number] and [place] [phone]

## 2015-09-17 NOTE — Progress Notes (Signed)
Pt a/o, no c/o pain, pt given MOM and prune juice, no BM yet, VSS, pt stable, pt resting comfortably

## 2015-09-17 NOTE — Progress Notes (Signed)
Occupational Therapy Treatment and Discharge Patient Details Name: Victoria Banks MRN: [id number] DOB: [birth year] Today's Date: 09/17/2015    History of present illness 81 year old female with a history of hypertension, hyperlipidemia, coronary artery disease, bioprosthetic aortic valve and diastolic CHF presented with one-week history of worsening shortness of breath.   OT comments  This 81 yo female seen today for treatment session and doing very well. Pt at a Mod I for grooming, toileting, and LBD; not showing any issues with need for energy conservation techniques. Do not foresee that pt will have any issues with her basic ADLs when she returns to [facility], that she will be able to function at a Mod I to independent level. Acute OT will sign off.  Follow Up Recommendations  No OT follow up (A as she feels she needs it)    Equipment Recommendations  None recommended by OT       Precautions / Restrictions Precautions Precautions: Fall Restrictions Weight Bearing Restrictions: No       Mobility Bed Mobility Overal bed mobility: Modified Independent                Transfers Overall transfer level: Modified independent Equipment used: Rolling walker (2 wheeled) Transfers: Sit to/from Stand Sit to Stand: Modified independent (Device/Increase time)                  ADL       Grooming: Modified independent;Wash/dry hands;Oral care;Brushing hair;Standing               Lower Body Dressing: Modified independent;Sit to/from stand   Toilet Transfer: Modified Independent (bed>ambulate 20 feet with RW>recliner)           Functional mobility during ADLs: Modified independent (with RW and intermittently without (pt furniture walking)) General ADL Comments: Pt did not need AE for LB ADLs, able to cross legs and get to feet without issues                Cognition   Behavior During Therapy: [facility] for tasks assessed/performed Overall Cognitive  Status: Within Functional Limits for tasks assessed                                    Pertinent Vitals/ Pain       Pain Assessment: No/denies pain         Frequency Min 2X/week     Progress Toward Goals  OT Goals(current goals can now be found in the care plan section)  Progress towards OT goals:  (see clinical impression statement)     Plan Discharge plan remains appropriate       End of Session Equipment Utilized During Treatment: Rolling walker   Activity Tolerance Patient tolerated treatment well   Patient Left in chair;with call bell/phone within reach;with chair alarm set   Nurse Communication          Time: [phone] OT Time Calculation (min): 17 min  Charges: OT General Charges $OT Visit: 1 Procedure OT Treatments $Self Care/Home Management : 8-22 mins  Almon Register [id number] 09/17/2015, 9:10 AM

## 2015-09-17 NOTE — Progress Notes (Signed)
09/17/15 @ 1619, pt had 6 beat run of V-tach.  MD notified.  VSS.  Pt asleep and asymptomatic.

## 2015-09-17 NOTE — Progress Notes (Signed)
[facility] PROGRESS NOTE    Progress Note   KALLY CADDEN [id number] DOB: [birth year] DOA: 09/11/2015 PCP: Mathews Argyle, MD   Brief Narrative:   Victoria Banks is an 81 y.o. female past medical history of coronary artery sees, by a prostatic valve comes into the hospital for dyspnea, her furosemide was recently increased from 40-80. With no improvement, she was started in the hospital on IV Lasix and metolazone and has diuresed well.  Assessment/Plan:   Acute respiratory failure with hypoxia (HCC) due to Acute on chronic diastolic CHF (congestive heart failure) (HCC)/Cardiorenal syndrome: Appears to be significant fluid overloaded despite good diuresis, her weight has not improve with diuresis. She is on Lasix 80 mg IV twice a day. Her creatinine has remained stable, continue to monitor daily. Repeat electrolyte as needed. Unknown estimated dry weight. Place ted hose. She still fluid overloaded has JVD and bilateral crackles. Basic metabolic panels pending.  Aortic valve disorder: 2-D echo showing mild aortic stenosis.  Essential hypertension, benign: Currently elevated likely due to volume overload. Blood pressure is improving, with diuresis Imdur and Nifedipine  Acute kidney injury on CKD (chronic kidney disease) stage 4, GFR 15-29 ml/min ([place]): Baseline creatinine is 1.8-1.9. Continue IV Lasix. Basic minimal panels pending.  Hypokalemia: b-met pending.    DVT Prophylaxis - Lovenox ordered.  Family Communication:none Disposition Plan: Home in 2-3 days Code Status:     Code Status Orders        Start     Ordered   09/11/15 2053  Full code   Continuous     09/11/15 2052    Advance Directive Documentation        Most Recent Value   Type of Advance Directive  Living will   Pre-existing out of facility DNR order (yellow form or pink MOST form)     "MOST" Form in Place?          IV Access:    Peripheral IV   Procedures and  diagnostic studies:   No results found.   Medical Consultants:    None.  Anti-Infectives:   Anti-infectives    None      Subjective:    COBIE LEIDNER she relates shortness of breath is significantly improved today. Had a bowel movement today.  Objective:    Filed Vitals:   09/16/15 0444 09/16/15 1003 09/16/15 2036 09/17/15 0353  BP: 162/71 167/57 180/74 150/55  Pulse: 77 71 78 77  Temp: 98 F (36.7 C)  97.8 F (36.6 C) 98 F (36.7 C)  TempSrc: Oral  Oral Oral  Resp: '16  18 18  '$ Height:      Weight: 51.48 kg (113 lb 7.9 oz)   53.615 kg (118 lb 3.2 oz)  SpO2: 92%  92% 92%    Intake/Output Summary (Last 24 hours) at 09/17/15 0840 Last data filed at 09/17/15 0500  Gross per 24 hour  Intake   1095 ml  Output   2101 ml  Net  -1006 ml   Filed Weights   09/15/15 0538 09/16/15 0444 09/17/15 0353  Weight: 51.438 kg (113 lb 6.4 oz) 51.48 kg (113 lb 7.9 oz) 53.615 kg (118 lb 3.2 oz)    Exam: Gen:  NAD Cardiovascular:  RRR. Positive JVD Chest and lungs:   Good air movement , still with bilateral lower lobe crackles. Abdomen:  Abdomen soft, NT/ND, + BS Extremities:  3+ edema   Data Reviewed:    Labs: Basic Metabolic Panel:  Recent Labs  Lab 09/11/15 1715 09/12/15 0555 09/13/15 0651 09/13/15 1025 09/14/15 0506 09/15/15 1120  NA 136 138 137  --  136 136  K 4.9 3.7 3.0*  --  3.6 2.9*  CL 102 103 100*  --  101 96*  CO2 '23 25 27  '$ --  26 27  GLUCOSE 125* 108* 130*  --  114* 105*  BUN 25* 21* 24*  --  25* 29*  CREATININE 2.09* 2.11* 2.21*  --  2.24* 2.37*  CALCIUM 9.0 8.5* 8.0*  --  7.9* 8.5*  MG  --   --   --  2.2  --   --    GFR Estimated Creatinine Clearance: 11.4 mL/min (by C-G formula based on Cr of 2.37). Liver Function Tests:  Recent Labs Lab 09/11/15 1715  AST 23  ALT 16  ALKPHOS 51  BILITOT 0.5  PROT 6.7  ALBUMIN 3.2*   No results for input(s): LIPASE, AMYLASE in the last 168 hours. No results for input(s): AMMONIA in the last 168  hours. Coagulation profile No results for input(s): INR, PROTIME in the last 168 hours.  CBC:  Recent Labs Lab 09/11/15 1715  WBC 8.3  NEUTROABS 6.2  HGB 11.7*  HCT 36.0  MCV 87.8  PLT 253   Cardiac Enzymes: No results for input(s): CKTOTAL, CKMB, CKMBINDEX, TROPONINI in the last 168 hours. BNP (last 3 results) No results for input(s): PROBNP in the last 8760 hours. CBG: No results for input(s): GLUCAP in the last 168 hours. D-Dimer: No results for input(s): DDIMER in the last 72 hours. Hgb A1c: No results for input(s): HGBA1C in the last 72 hours. Lipid Profile: No results for input(s): CHOL, HDL, LDLCALC, TRIG, CHOLHDL, LDLDIRECT in the last 72 hours. Thyroid function studies: No results for input(s): TSH, T4TOTAL, T3FREE, THYROIDAB in the last 72 hours.  Invalid input(s): FREET3 Anemia work up: No results for input(s): VITAMINB12, FOLATE, FERRITIN, TIBC, IRON, RETICCTPCT in the last 72 hours. Sepsis Labs:  Recent Labs Lab 09/11/15 1715  WBC 8.3   Microbiology No results found for this or any previous visit (from the past 240 hour(s)).   Medications:   . aspirin EC  81 mg Oral Daily  . enoxaparin (LOVENOX) injection  30 mg Subcutaneous Q24H  . furosemide  80 mg Intravenous BID  . isosorbide mononitrate  30 mg Oral Daily  . LORazepam  0.5 mg Oral QHS  . NIFEdipine  90 mg Oral BID  . pantoprazole  40 mg Oral Daily  . polyethylene glycol  17 g Oral Daily  . pravastatin  40 mg Oral q1800  . sodium chloride  3 mL Intravenous Q12H   Continuous Infusions:   Time spent: 25 min   LOS: 6 days   [name]  [facility] Pager [phone]  *Please refer to [email or web address], password [email or web address] to get updated schedule on who will round on this patient, as hospitalists switch teams weekly. If 7PM-7AM, please contact night-coverage at [email or web address], password [email or web address] for any overnight needs.  09/17/2015, 8:40 AM

## 2015-09-17 NOTE — Progress Notes (Addendum)
Heart Failure Navigator Consult Note  Presentation: Victoria Banks is a 81 year old female with a history of hypertension, hyperlipidemia, coronary artery disease, bioprosthetic aortic valve and diastolic CHF presented with one-week history of worsening shortness of breath. The patient went to see her primary care provider on 09/05/15 at which time, the patient's furosemide was increased to 80 mg daily. The patient previously took alternating days between 40 and 80 mg. At that time, her losartan was also discontinued because of worsening creatinine. Unfortunately, the patient's dyspnea continued to worsened causing her to present to the emergency department. The patient also complains of increasing lower extremity edema and increasing abdominal girth over the past week. In addition, she has had symptoms of orthopnea having to sleep in a recliner for the past 3 nights. She denies any fevers, chills, chest pain, nausea, vomiting, diarrhea, abdominal pain, dysuria, hematuria. She has a nonproductive cough without hemoptysis. The patient is a resident at [facility]. She endorses compliance with all her medications, but she states that she has been a little liberal with her fluid intake.  Past Medical History  Diagnosis Date  . Allergic rhinitis   . Hypertension   . Fibrocystic breast disease   . GERD (gastroesophageal reflux disease)   . Aortic stenosis, severe 08/2009    s/p AVR w pericardial tissue valve 10/2009  . Osteoporosis 12/2010    Reclast given  . Multinodular goiter   . Coronary artery disease 10/2009    S/P SVG to OM   . CRD (chronic renal disease), stage III   . Positive for microalbuminuria     On tevetan  . Benign familial tremor   . Atelectasis     scarring at basis on CXR    Social History   Social History  . Marital Status: Widowed    Spouse Name: N/A  . Number of Children: N/A  . Years of Education: N/A   Social History Main Topics  . Smoking status: Never  Smoker   . Smokeless tobacco: None  . Alcohol Use: No  . Drug Use: No  . Sexual Activity: Not Asked   Other Topics Concern  . None   Social History Narrative    ECHO:Study Conclusions-09/12/15  - Left ventricle: The cavity size was normal. Wall thickness was increased in a pattern of mild LVH. Systolic function was normal. The estimated ejection fraction was in the range of 55% to 60%. Wall motion was normal; there were no regional wall motion abnormalities. - Aortic valve: A bioprosthesis was present and functioning normally. There was very mild stenosis. - Mitral valve: There was mild regurgitation. - Pulmonary arteries: PA peak pressure: 43 mm Hg (S). - Pericardium, extracardiac: There was a left pleural effusion.  Transthoracic echocardiography. M-mode, complete 2D, spectral Doppler, and color Doppler. Birthdate: Patient birthdate: [birth year]. Age: Patient is 81 yr old. Sex: Gender: female. BMI: 21.9 kg/m^2. Blood pressure:   164/58 Patient status: Inpatient. Study date: Study date: 09/12/2015. Study time: 10:17 AM. Location: Echo laboratory.  BNP    Component Value Date/Time   BNP 1257.4* 09/14/2015 0506    ProBNP    Component Value Date/Time   PROBNP 1863.0* 08/14/2014 1446     Education Assessment and Provision:  Detailed education and instructions provided on heart failure disease management including the following:  Signs and symptoms of Heart Failure When to call the physician Importance of daily weights Low sodium diet Fluid restriction Medication management Anticipated future follow-up appointments  Patient education given on  each of the above topics.  Patient acknowledges understanding and acceptance of all instructions.  I spoke briefly with [name] related to her HF.  She is able to teach back topics listed above.  She lives at [place].  She weighs daily and can tell me when she would contact  physician related to increase weights.  She tells me that she sometimes eats in the "dining hall"--but says that the food is sometimes too salty.  I reviewed a low sodium diet and high sodium foods to avoid.  She also says that she has no issues with getting or taking prescribed medications.  She will follow with [facility] as an outpatient.     Education Materials:  "Living Better With Heart Failure" Booklet, Daily Weight Tracker Tool  High Risk Criteria for Readmission and/or Poor Patient Outcomes:   EF <30%- No-55-60%  2 or more admissions in 6 months- 1/6  Difficult social situation- No  Demonstrates medication noncompliance-  denies   Barriers of Care:  Knowledge and compliance  Discharge Planning:   She plans to return to [place].  She is also set up with [facility] for ongoing education, compliance reinforcement and symptom recognition.

## 2015-09-18 ENCOUNTER — Encounter (HOSPITAL_COMMUNITY): Payer: Self-pay | Admitting: Student

## 2015-09-18 DIAGNOSIS — E876 Hypokalemia: Secondary | ICD-10-CM

## 2015-09-18 LAB — BASIC METABOLIC PANEL
Anion gap: 12 (ref 5–15)
BUN: 44 mg/dL — AB (ref 6–20)
CHLORIDE: 91 mmol/L — AB (ref 101–111)
CO2: 31 mmol/L (ref 22–32)
CREATININE: 2.61 mg/dL — AB (ref 0.44–1.00)
Calcium: 8.8 mg/dL — ABNORMAL LOW (ref 8.9–10.3)
GFR calc Af Amer: 17 mL/min — ABNORMAL LOW (ref >60)
GFR calc non Af Amer: 15 mL/min — ABNORMAL LOW (ref >60)
GLUCOSE: 106 mg/dL — AB (ref 65–99)
POTASSIUM: 2.6 mmol/L — AB (ref 3.5–5.1)
SODIUM: 134 mmol/L — AB (ref 135–145)

## 2015-09-18 MED ORDER — FUROSEMIDE 40 MG PO TABS: 60.0000 mg | ORAL_TABLET | Freq: Two times a day (BID) | ORAL | Status: DC

## 2015-09-18 MED ORDER — POTASSIUM CHLORIDE CRYS ER 10 MEQ PO TBCR
30.0000 meq | EXTENDED_RELEASE_TABLET | ORAL | Status: AC
  Administered 2015-09-18: 30 meq via ORAL
  Filled 2015-09-18: qty 3

## 2015-09-18 MED ORDER — FUROSEMIDE 80 MG PO TABS
80.0000 mg | ORAL_TABLET | Freq: Two times a day (BID) | ORAL | Status: DC
  Administered 2015-09-18 – 2015-09-19 (×3): 80 mg via ORAL
  Filled 2015-09-18 (×3): qty 1

## 2015-09-18 MED ORDER — POTASSIUM CHLORIDE CRYS ER 20 MEQ PO TBCR
40.0000 meq | EXTENDED_RELEASE_TABLET | Freq: Three times a day (TID) | ORAL | Status: AC
  Administered 2015-09-18 (×3): 40 meq via ORAL
  Filled 2015-09-18 (×4): qty 2

## 2015-09-18 MED ORDER — MAGNESIUM HYDROXIDE 400 MG/5ML PO SUSP
5.0000 mL | Freq: Once | ORAL | Status: AC
  Administered 2015-09-18: 5 mL via ORAL

## 2015-09-18 NOTE — Progress Notes (Signed)
Patient Name: Victoria Banks Date of Encounter: 09/18/2015  Principal Problem:   Acute on chronic diastolic CHF (congestive heart failure) ([place]) Active Problems:   Aortic valve disorder   Essential hypertension, benign   Acute respiratory failure with hypoxia (HCC)   CKD (chronic kidney disease) stage 4, GFR 15-29 ml/min (HCC)   AKI (acute kidney injury) ([place])   Coronary artery disease involving bypass graft of transplanted heart without angina pectoris   S/P AVR    Primary Cardiologist: Dr. Acie Banks Patient Profile: 81 y.o. female w/ PMH of chronic diastolic CHF, HTN, CAD (s/p CABG in 2011 with SVG-OM), s/p AVR in 2011, and Stage 3 CKD (baseline 1.6-1.8) who presented to [facility] ED on 09/11/2015 for worsening dyspnea over the past two weeks.  SUBJECTIVE: Reports improvement in her breathing. Not requiring O2. Has been walking up and down the hallway without difficulty.  OBJECTIVE Filed Vitals:   09/17/15 1004 09/17/15 1206 09/17/15 2134 09/18/15 0530  BP: 158/57 154/63 169/63 167/60  Pulse: 71 73 79 70  Temp:  97.8 F (36.6 C) 97.5 F (36.4 C) 98.2 F (36.8 C)  TempSrc:  Oral Oral Oral  Resp:  19 18   Height:      Weight:    114 lb 10.2 oz (52 kg)  SpO2:  93% 92% 92%    Intake/Output Summary (Last 24 hours) at 09/18/15 1028 Last data filed at 09/18/15 0840  Gross per 24 hour  Intake   1100 ml  Output   2350 ml  Net  -1250 ml   Filed Weights   09/16/15 0444 09/17/15 0353 09/18/15 0530  Weight: 113 lb 7.9 oz (51.48 kg) 118 lb 3.2 oz (53.615 kg) 114 lb 10.2 oz (52 kg)    PHYSICAL EXAM General: Well developed, well nourished, female in no acute distress. Head: Normocephalic, atraumatic.  Neck: Supple without bruits, JVD not elevated. Lungs:  Resp regular and unlabored, minimal rales at bases bilaterally. Heart: RRR, S1, S2, no S3, S4, or murmur; no rub. Abdomen: Soft, non-tender, non-distended with normoactive bowel sounds. No hepatomegaly. No  rebound/guarding. No obvious abdominal masses. Extremities: No clubbing, cyanosis, or edema. Distal pedal pulses are 2+ bilaterally. Neuro: Alert and oriented X 3. Moves all extremities spontaneously. Psych: Normal affect.  LABS: Basic Metabolic Panel: Recent Labs  09/17/15 1117 09/18/15 0457  NA 137 134*  K 3.4* 2.6*  CL 96* 91*  CO2 31 31  GLUCOSE 153* 106*  BUN 40* 44*  CREATININE 2.45* 2.61*  CALCIUM 8.7* 8.8*   No results for input(s): TROPIPOC in the last 72 hours. BNP:  B NATRIURETIC PEPTIDE  Date/Time Value Ref Range Status  09/14/2015 05:06 AM 1257.4* 0.0 - 100.0 pg/mL Final  09/11/2015 05:15 PM 1681.0* 0.0 - 100.0 pg/mL Final   TELE:   NSR with rate in 60's - 80's.  6 beats of NSVT at 1600 on 09/17/2015  ECHO: Study Conclusions - Left ventricle: The cavity size was normal. Wall thickness was increased in a pattern of mild LVH. Systolic function was normal. The estimated ejection fraction was in the range of 55% to 60%. Wall motion was normal; there were no regional wall motion abnormalities. - Aortic valve: A bioprosthesis was present and functioning normally. There was very mild stenosis. - Mitral valve: There was mild regurgitation. - Pulmonary arteries: PA peak pressure: 43 mm Hg (S). - Pericardium, extracardiac: There was a left pleural effusion.  Radiology/Studies: No results found.   Current Medications:  .  aspirin EC  81 mg Oral Daily  . enoxaparin (LOVENOX) injection  30 mg Subcutaneous Q24H  . furosemide  80 mg Oral BID  . isosorbide mononitrate  60 mg Oral Daily  . LORazepam  0.5 mg Oral QHS  . magnesium hydroxide  5 mL Oral QODAY  . magnesium hydroxide  5 mL Oral Once  . NIFEdipine  90 mg Oral BID  . pantoprazole  40 mg Oral Daily  . polyethylene glycol  17 g Oral Daily  . potassium chloride  30 mEq Oral Q4H  . potassium chloride  40 mEq Oral TID  . pravastatin  40 mg Oral q1800  . sodium chloride  3 mL Intravenous Q12H        ASSESSMENT AND PLAN:  1. Acute on Chronic Diastolic CHF - presented with worsening dyspnea, orthopnea, and lower extremity edema over the past two weeks. PO Lasix had been increased to 80mg  daily (previously alternating between 40mg  and 80mg  daily) with no improvement in her symptoms. BNP elevated to 1681 at time of admission. - repeat echo shows preserved EF of 55-60%, unchanged from previous study.  - reports baseline weight of 118 - 120lbs but has not weighed in months. Was 115 lbs on admission. At 114 lbs on 09/18/2015. - Her net output is recorded as -7.8L. Net output yesterday was -1.0 L. - Has been switched to Lasix 80mg  PO BID.  2. CAD - s/p CABG in 2011 with SVG-OM at the time of her AVR. - continue ASA, Procardia, and statin  3. AKI on Stage 3 CKD - baseline creatinine of 1.6 - 1.8. Renal US showed medical renal disease with no hydronephrosis. - had been elevated to 2.0 prior to admission in 05/2015. - trending upward to 2.61 on 09/18/2015. Will hopefully start to normalize coming off of IV Lasix  4. S/P AVR - in 2011 - appears to be functioning normally on echo.  5. Essential HTN - BP has been 154/60 - 169/63 in the past 24 hours. Refused BiDil due to Hydralazine making her cough. On Nifedipine 90 mg daily. Imdur recently increased to 60mg  daily on 09/17/2015 (receiving her first dose today).   Wishes to follow-up with Dr. Felipa Banks (her PCP) in regards to her CHF. She reports getting confused with her medications when they were controlled by both her PCP and Cardiologist. She has seen Dr. Aundra Banks and Dr. Radford Banks in the past but does not want to follow-up with either one of them. Would prefer for Dr. Martinique to be her Cardiologist going forward if Dr. Felipa Banks requests for her to see a Cardiologist again.  Victoria Banks , PA-C 10:28 AM 09/18/2015 Pager: [phone] Patient seen and examined and history reviewed. Agree with above findings and plan. [name]  really looks good. Walking in halls without dyspnea. Weight is down. I/O negative another liter. Switched back to oral lasix. Need to replete potassium which is quite low. Hopefully can DC tomorrow.   Victoria Banks, [place] 09/18/2015 12:49 PM

## 2015-09-18 NOTE — Progress Notes (Signed)
[facility] PROGRESS NOTE    Progress Note   Victoria Banks [id number] DOB: 1921/05/19 DOA: 09/11/2015 PCP: Mathews Argyle, MD   Brief Narrative:   Victoria Banks is an 81 y.o. female past medical history of coronary artery sees, by a prostatic valve comes into the hospital for dyspnea, her furosemide was recently increased from 40-80. With no improvement, she was started in the hospital on IV Lasix and metolazone and has diuresed well.  Assessment/Plan:   Acute respiratory failure with hypoxia (HCC) due to Acute on chronic diastolic CHF (congestive heart failure) (HCC)/Cardiorenal syndrome: Lower extremity edema has improved, she still has  JVD, her weight has not improve with diuresis. Change her Lasix to 80 mg twice a day. Mild increase in her creatinine today. Replete potassium orally. Unknown estimated dry weight.   Aortic valve disorder: 2-D echo showing mild aortic stenosis.  Essential hypertension, benign: Currently elevated likely due to volume overload. Blood pressure has remained high she is on Lasix, an increased dose Imdur for today and Procardia.  Acute kidney injury on CKD (chronic kidney disease) stage 4, GFR 15-29 ml/min ([place]): Probably a new baseline of 2.4-2.6.  Hypokalemia: Low replete orally recheck in the morning.    DVT Prophylaxis - Lovenox ordered.  Family Communication:none Disposition Plan: Home in am Code Status:     Code Status Orders        Start     Ordered   09/11/15 2053  Full code   Continuous     09/11/15 2052    Advance Directive Documentation        Most Recent Value   Type of Advance Directive  Living will   Pre-existing out of facility DNR order (yellow form or pink MOST form)     "MOST" Form in Place?          IV Access:    Peripheral IV   Procedures and diagnostic studies:   No results found.   Medical Consultants:    None.  Anti-Infectives:   Anti-infectives    None       Subjective:    Victoria Banks has had several bowel movements, she relates her shortness of breath is improved.  Objective:    Filed Vitals:   09/17/15 1004 09/17/15 1206 09/17/15 2134 09/18/15 0530  BP: 158/57 154/63 169/63 167/60  Pulse: 71 73 79 70  Temp:  97.8 F (36.6 C) 97.5 F (36.4 C) 98.2 F (36.8 C)  TempSrc:  Oral Oral Oral  Resp:  19 18   Height:      Weight:    52 kg (114 lb 10.2 oz)  SpO2:  93% 92% 92%    Intake/Output Summary (Last 24 hours) at 09/18/15 0800 Last data filed at 09/18/15 0400  Gross per 24 hour  Intake    860 ml  Output   1900 ml  Net  -1040 ml   Filed Weights   09/16/15 0444 09/17/15 0353 09/18/15 0530  Weight: 51.48 kg (113 lb 7.9 oz) 53.615 kg (118 lb 3.2 oz) 52 kg (114 lb 10.2 oz)    Exam: Gen:  NAD Cardiovascular:  RRR. Positive JVD Chest and lungs:   Good air movement , still with bilateral lower lobe crackles. Abdomen:  Abdomen soft, NT/ND, + BS Extremities:  Trace lower extremity edema.   Data Reviewed:    Labs: Basic Metabolic Panel:  Recent Labs Lab 09/13/15 0651 09/13/15 1025 09/14/15 0506 09/15/15 1120 09/17/15 1117 09/18/15 0457  NA  137  --  136 136 137 134*  K 3.0*  --  3.6 2.9* 3.4* 2.6*  CL 100*  --  101 96* 96* 91*  CO2 27  --  26 27 31 31   GLUCOSE 130*  --  114* 105* 153* 106*  BUN 24*  --  25* 29* 40* 44*  CREATININE 2.21*  --  2.24* 2.37* 2.45* 2.61*  CALCIUM 8.0*  --  7.9* 8.5* 8.7* 8.8*  MG  --  2.2  --   --   --   --    GFR Estimated Creatinine Clearance: 10.4 mL/min (by C-G formula based on Cr of 2.61). Liver Function Tests:  Recent Labs Lab 09/11/15 1715  AST 23  ALT 16  ALKPHOS 51  BILITOT 0.5  PROT 6.7  ALBUMIN 3.2*   No results for input(s): LIPASE, AMYLASE in the last 168 hours. No results for input(s): AMMONIA in the last 168 hours. Coagulation profile No results for input(s): INR, PROTIME in the last 168 hours.  CBC:  Recent Labs Lab 09/11/15 1715  WBC 8.3   NEUTROABS 6.2  HGB 11.7*  HCT 36.0  MCV 87.8  PLT 253   Cardiac Enzymes: No results for input(s): CKTOTAL, CKMB, CKMBINDEX, TROPONINI in the last 168 hours. BNP (last 3 results) No results for input(s): PROBNP in the last 8760 hours. CBG: No results for input(s): GLUCAP in the last 168 hours. D-Dimer: No results for input(s): DDIMER in the last 72 hours. Hgb A1c: No results for input(s): HGBA1C in the last 72 hours. Lipid Profile: No results for input(s): CHOL, HDL, LDLCALC, TRIG, CHOLHDL, LDLDIRECT in the last 72 hours. Thyroid function studies: No results for input(s): TSH, T4TOTAL, T3FREE, THYROIDAB in the last 72 hours.  Invalid input(s): FREET3 Anemia work up: No results for input(s): VITAMINB12, FOLATE, FERRITIN, TIBC, IRON, RETICCTPCT in the last 72 hours. Sepsis Labs:  Recent Labs Lab 09/11/15 1715  WBC 8.3   Microbiology No results found for this or any previous visit (from the past 240 hour(s)).   Medications:   . aspirin EC  81 mg Oral Daily  . enoxaparin (LOVENOX) injection  30 mg Subcutaneous Q24H  . furosemide  60 mg Oral BID  . isosorbide mononitrate  60 mg Oral Daily  . LORazepam  0.5 mg Oral QHS  . magnesium hydroxide  5 mL Oral QODAY  . NIFEdipine  90 mg Oral BID  . pantoprazole  40 mg Oral Daily  . polyethylene glycol  17 g Oral Daily  . potassium chloride  30 mEq Oral Q4H  . potassium chloride  40 mEq Oral TID  . pravastatin  40 mg Oral q1800  . sodium chloride  3 mL Intravenous Q12H   Continuous Infusions:   Time spent: 15 min   LOS: 7 days   [name]  [facility] Pager [phone]  *Please refer to [email or web address], password [email or web address] to get updated schedule on who will round on this patient, as hospitalists switch teams weekly. If 7PM-7AM, please contact night-coverage at [email or web address], password [email or web address] for any overnight needs.  09/18/2015, 8:00 AM

## 2015-09-18 NOTE — Progress Notes (Signed)
C ritical lab of potassium called to midlevel---orders received to replenish. Will continue to assess patient

## 2015-09-19 ENCOUNTER — Encounter (HOSPITAL_COMMUNITY): Payer: Self-pay | Admitting: Student

## 2015-09-19 LAB — BASIC METABOLIC PANEL
ANION GAP: 11 (ref 5–15)
BUN: 44 mg/dL — AB (ref 6–20)
CHLORIDE: 92 mmol/L — AB (ref 101–111)
CO2: 29 mmol/L (ref 22–32)
Calcium: 8.5 mg/dL — ABNORMAL LOW (ref 8.9–10.3)
Creatinine, Ser: 2.47 mg/dL — ABNORMAL HIGH (ref 0.44–1.00)
GFR calc Af Amer: 18 mL/min — ABNORMAL LOW (ref >60)
GFR, EST NON AFRICAN AMERICAN: 16 mL/min — AB (ref >60)
Glucose, Bld: 100 mg/dL — ABNORMAL HIGH (ref 65–99)
POTASSIUM: 4.2 mmol/L (ref 3.5–5.1)
SODIUM: 132 mmol/L — AB (ref 135–145)

## 2015-09-19 LAB — MAGNESIUM: MAGNESIUM: 2.6 mg/dL — AB (ref 1.7–2.4)

## 2015-09-19 MED ORDER — ISOSORBIDE MONONITRATE ER 60 MG PO TB24: 60.0000 mg | ORAL_TABLET | Freq: Every day | ORAL | Status: DC

## 2015-09-19 MED ORDER — FUROSEMIDE 80 MG PO TABS: 80.0000 mg | ORAL_TABLET | Freq: Two times a day (BID) | ORAL | Status: DC

## 2015-09-19 MED ORDER — FUROSEMIDE 20 MG PO TABS: 60.0000 mg | ORAL_TABLET | Freq: Every day | ORAL | Status: DC

## 2015-09-19 MED ORDER — FUROSEMIDE 80 MG PO TABS: 80.0000 mg | ORAL_TABLET | ORAL | Status: DC

## 2015-09-19 MED ORDER — NIFEDIPINE ER OSMOTIC RELEASE 90 MG PO TB24: 90.0000 mg | ORAL_TABLET | Freq: Two times a day (BID) | ORAL | Status: DC

## 2015-09-19 NOTE — Progress Notes (Signed)
Patient Name: Victoria Banks Date of Encounter: 09/19/2015  Principal Problem:   Acute on chronic diastolic CHF (congestive heart failure) ([place]) Active Problems:   Aortic valve disorder   Essential hypertension, benign   Acute respiratory failure with hypoxia (HCC)   CKD (chronic kidney disease) stage 4, GFR 15-29 ml/min (HCC)   AKI (acute kidney injury) ([place])   Coronary artery disease involving bypass graft of transplanted heart without angina pectoris   S/P AVR    Primary Cardiologist: Requests Dr. Martinique Patient Profile: 81 y.o. female w/ PMH of chronic diastolic CHF, HTN, CAD (s/p CABG in 2011 with SVG-OM), s/p AVR in 2011, and Stage 3 CKD (baseline 1.6-1.8) who presented to [facility] ED on 09/11/2015 for worsening dyspnea over the past two weeks.  SUBJECTIVE: Doing well. Reports breathing is at baseline, she just feels "weak". Ambulating without difficulty. Ready to go home.  OBJECTIVE Filed Vitals:   09/17/15 2134 09/18/15 0530 09/18/15 1141 09/19/15 0450  BP: 169/63 167/60 141/56 161/58  Pulse: 79 70 71 68  Temp: 97.5 F (36.4 C) 98.2 F (36.8 C) 97.7 F (36.5 C) 97.5 F (36.4 C)  TempSrc: Oral Oral Oral Oral  Resp: 18  18 18   Height:      Weight:  114 lb 10.2 oz (52 kg)  114 lb 10.2 oz (52 kg)  SpO2: 92% 92% 95% 92%    Intake/Output Summary (Last 24 hours) at 09/19/15 0913 Last data filed at 09/19/15 0910  Gross per 24 hour  Intake   1420 ml  Output   2451 ml  Net  -1031 ml   Filed Weights   09/17/15 0353 09/18/15 0530 09/19/15 0450  Weight: 118 lb 3.2 oz (53.615 kg) 114 lb 10.2 oz (52 kg) 114 lb 10.2 oz (52 kg)    PHYSICAL EXAM General: Well developed, well nourished, female in no acute distress. Head: Normocephalic, atraumatic.  Neck: Supple without bruits, JVD not elevated. Lungs:  Resp regular and unlabored, CTA without wheezing or rales. Heart: RRR, S1, S2, no S3, S4, or murmur; no rub. Abdomen: Soft, non-tender, non-distended with  normoactive bowel sounds. No hepatomegaly. No rebound/guarding. No obvious abdominal masses. Extremities: No clubbing, cyanosis, or edema. Distal pedal pulses are 2+ bilaterally. Neuro: Alert and oriented X 3. Moves all extremities spontaneously. Psych: Normal affect.  LABS: Basic Metabolic Panel:  Recent Labs  09/18/15 0457 09/19/15 0519  NA 134* 132*  K 2.6* 4.2  CL 91* 92*  CO2 31 29  GLUCOSE 106* 100*  BUN 44* 44*  CREATININE 2.61* 2.47*  CALCIUM 8.8* 8.5*  MG  --  2.6*   No results for input(s): TROPIPOC in the last 72 hours. BNP:  B NATRIURETIC PEPTIDE  Date/Time Value Ref Range Status  09/14/2015 05:06 AM 1257.4* 0.0 - 100.0 pg/mL Final  09/11/2015 05:15 PM 1681.0* 0.0 - 100.0 pg/mL Final    TELE:   NSR with rate in 60's - 80's.  Frequent PVC's. No other atopic events.  ECHO: 09/12/2015 Study Conclusions - Left ventricle: The cavity size was normal. Wall thickness was increased in a pattern of mild LVH. Systolic function was normal. The estimated ejection fraction was in the range of 55% to 60%. Wall motion was normal; there were no regional wall motion abnormalities. - Aortic valve: A bioprosthesis was present and functioning normally. There was very mild stenosis. - Mitral valve: There was mild regurgitation. - Pulmonary arteries: PA peak pressure: 43 mm Hg (S). - Pericardium, extracardiac:  There was a left pleural effusion.   Current Medications:  . aspirin EC  81 mg Oral Daily  . enoxaparin (LOVENOX) injection  30 mg Subcutaneous Q24H  . furosemide  80 mg Oral BID  . isosorbide mononitrate  60 mg Oral Daily  . LORazepam  0.5 mg Oral QHS  . magnesium hydroxide  5 mL Oral QODAY  . NIFEdipine  90 mg Oral BID  . pantoprazole  40 mg Oral Daily  . polyethylene glycol  17 g Oral Daily  . pravastatin  40 mg Oral q1800  . sodium chloride  3 mL Intravenous Q12H      ASSESSMENT AND PLAN:  1. Acute on Chronic Diastolic CHF - presented with  worsening dyspnea, orthopnea, and lower extremity edema over the past two weeks. PO Lasix had been increased to 80mg  daily (previously alternating between 40mg  and 80mg  daily) with no improvement in her symptoms. BNP elevated to 1681 at time of admission. - repeat echo shows preserved EF of 55-60%, unchanged from previous study.  - reports baseline weight of 118 - 120lbs but has not weighed in months. Was 115 lbs on admission. At 114 lbs on 09/19/2015. Has been stable at this weight the past few days. - Her net output is recorded as -8.7L. Net output yesterday was almost -1.0 L. - Continue Lasix 80mg  PO BID. - Reinforced the importance of weighing daily and limiting sodium intake in her diet.  2. CAD - s/p CABG in 2011 with SVG-OM at the time of her AVR. - continue ASA, Procardia, and statin  3. AKI on Stage 3 CKD - baseline creatinine of 1.6 - 1.8. Renal US showed medical renal disease with no hydronephrosis. - had been elevated to 2.0 prior to admission in 05/2015. - trending upward to 2.61 on 09/18/2015. Will hopefully start to normalize coming off of IV Lasix  4. S/P AVR - in 2011 - appears to be functioning normally on echo.  5. Essential HTN - BP has been in the 140's - 160's in the past 24 hours. Refused BiDil due to Hydralazine making her cough. On Nifedipine 90 mg daily. Imdur recently increased to 60mg  daily on 09/18/2015.  6. Hypokaelmia - K+ 2.6 on 09/18/2015. - replaced. At 4.2 today.   Wishes to follow-up with Dr. Felipa Eth (her PCP) in regards to her CHF.  Would prefer for Dr. Martinique to be her Cardiologist going forward if Dr. Felipa Eth requests for her to see a Cardiologist again.  Signed, Erma Heritage , PA-C 9:13 AM 09/19/2015   Patient seen and examined and history reviewed. Agree with above findings and plan. Mrs. Schall is doing well on oral lasix. Her BP is running a little high. This will need to be followed closely as outpatient. She is intolerant of  beta blockers and hydralazine. If her blood pressure remains high could consider a low dose of Cardura at bedtime. She will be DC on a higher lasix dose and this may need adjustment as outpatient. I am happy to see her in follow up.  Shloimy Michalski Martinique, [place] 09/19/2015 11:10 AM

## 2015-09-19 NOTE — Care Management Important Message (Signed)
Important Message  Patient Details  Name: Victoria Banks MRN: [id number] Date of Birth: Nov 24, 1920   Medicare Important Message Given:  Yes    Sheniece Ruggles P Tatumn Corbridge 09/19/2015, 1:50 PM

## 2015-09-19 NOTE — Discharge Summary (Signed)
Physician Discharge Summary  Victoria Banks [id number] DOB: October 23, 1920 DOA: 09/11/2015  PCP: Mathews Argyle, MD  Admit date: 09/11/2015 Discharge date: 09/19/2015  Time spent: 35  minutes  Recommendations for Outpatient Follow-up:  1. Follow up with Cardiology in 1 week check a b-met   Discharge Diagnoses:  Principal Problem:   Acute on chronic diastolic CHF (congestive heart failure) ([place]) Active Problems:   Aortic valve disorder   Essential hypertension, benign   Acute respiratory failure with hypoxia (HCC)   CKD (chronic kidney disease) stage 4, GFR 15-29 ml/min (HCC)   AKI (acute kidney injury) ([place])   Coronary artery disease involving bypass graft of transplanted heart without angina pectoris   S/P AVR   Discharge Condition: stabl  Diet recommendation: Low sodium fluid restricted   Filed Weights   09/17/15 0353 09/18/15 0530 09/19/15 0450  Weight: 53.615 kg (118 lb 3.2 oz) 52 kg (114 lb 10.2 oz) 52 kg (114 lb 10.2 oz)    History of present illness:  81 year old female with a history of hypertension, hyperlipidemia, coronary artery disease, bioprosthetic aortic valve and diastolic CHF presented with one-week history of worsening shortness of breath. The patient went to see her primary care provider on 09/05/15 at which time, the patient's furosemide was increased to 80 mg daily. The patient previously took alternating days between 40 and 80 mg. At that time, her losartan was also discontinued because of worsening creatinine. Unfortunately, the patient's dyspnea continued to worsened causing her to present to the emergency department. The patient also complains of increasing lower extremity edema and increasing abdominal girth over the past week.  Hospital Course:  Acute respiratory failure with hypoxia due to acute on chronic diastolic heart failure: She was started on IV Lasix she diureses well her lower extremity edema resolve her JVD resolved. Her weight  interestingly did not change significantly. She was discharged home on Lasix 80 mg twice a day she'll follow up with cardiology in 1 week recheck a basic metabolic panel then.  Disorder: 2-D echo was done showed aortic valve is unchanged.  Essential hypertension: Her Procardia and her Imdur were increased, we'll continue to titrate as an outpatient as tolerated.  Hypokalemia: Repeated orally it was esolved.   Procedures:  CXR  ECHO   (i.e. Studies not automatically included, echos, thoracentesis, etc; not x-rays) Consultations:  cardiology  Discharge Exam: Filed Vitals:   09/18/15 1141 09/19/15 0450  BP: 141/56 161/58  Pulse: 71 68  Temp: 97.7 F (36.5 C) 97.5 F (36.4 C)  Resp: 18 18    General: A&O x3 Cardiovascular: RRR Respiratory: good air movement CTA B/L  Discharge Instructions   Discharge Instructions    AMB Referral to [facility]    Complete by:  As directed   Reason for consult:  HF exacerbation - post hospital follow up in [place] living  Diagnoses of:  Heart Failure  Expected date of contact:  1-3 days (reserved for hospital discharges)  Please assign to community nurse for transition of care calls and assess for home visits. Patient lives in an [place], post hospital monitoring. Questions please call:  Natividad Brood, RN BSN [facility] Liaison  [phone] business mobile phone     Diet - low sodium heart healthy    Complete by:  As directed      Increase activity slowly    Complete by:  As directed           Current  Discharge Medication List    START taking these medications   Details  isosorbide mononitrate (IMDUR) 60 MG 24 hr tablet Take 1 tablet (60 mg total) by mouth daily. Qty: 30 tablet, Refills: 0      CONTINUE these medications which have CHANGED   Details  furosemide (LASIX) 80 MG tablet Take 1 tablet (80 mg total) by mouth 2 (two) times daily. Take 1 tab every  other day alternating with 1/2 tab every other day Qty: 30 tablet, Refills: 0    NIFEdipine (PROCARDIA XL/ADALAT-CC) 90 MG 24 hr tablet Take 1 tablet (90 mg total) by mouth 2 (two) times daily. Qty: 30 tablet, Refills: 03      CONTINUE these medications which have NOT CHANGED   Details  aspirin 81 MG tablet Take 81 mg by mouth daily.    calcium citrate-vitamin D (CALCIUM CITRATE +) 315-200 MG-UNIT per tablet Take 1 tablet by mouth 2 (two) times daily.    ipratropium (ATROVENT) 0.03 % nasal spray Place 2 sprays into both nostrils at bedtime as needed for rhinitis.     ketotifen (THERA TEARS ALLERGY) 0.025 % ophthalmic solution Place 1 drop into both eyes as needed (for dry eyes).    LORazepam (ATIVAN) 0.5 MG tablet Take 0.5 mg by mouth at bedtime.     MELATONIN PO Take 1 tablet by mouth at bedtime.     Multiple Vitamins-Minerals (CENTRUM SILVER ULTRA WOMENS PO) Take 1 tablet by mouth daily.    potassium chloride SA (K-DUR,KLOR-CON) 20 MEQ tablet TAKE 1 TABLET TWICE DAILY. Qty: 60 tablet, Refills: 0    pravastatin (PRAVACHOL) 40 MG tablet TAKE 1 TABLET IN THE P.M. Qty: 30 tablet, Refills: 6      STOP taking these medications     omeprazole (PRILOSEC) 20 MG capsule        Allergies  Allergen Reactions  . Aceon [Perindopril] Other (See Comments)    Chest Pain  . Beta Adrenergic Blockers Other (See Comments)    Serious cough both times she tried.  Marland Kitchen Hydralazine Hcl Cough  . Actonel [Risedronate Sodium] Other (See Comments)    Unknown  . Allegra [Fexofenadine] Other (See Comments)    BACK PAIN  . Biaxin [Clarithromycin] Nausea Only  . Ciprofloxacin Nausea Only  . Crestor [Rosuvastatin] Itching  . Fosamax [Alendronate Sodium] Other (See Comments)    Muscle pain  . Lipitor [Atorvastatin] Itching  . Promethazine Hcl Other (See Comments)    Unknown  . Simvastatin Rash  . Sulfa Antibiotics Other (See Comments)    GI Upset  . Welchol [Colesevelam Hcl] Rash   Follow-up  Information    Follow up with Peter Martinique, MD In 1 week.   Specialty:  Cardiology   Why:  hospital follow up   Contact information:   [place] [address] [phone]        The results of significant diagnostics from this hospitalization (including imaging, microbiology, ancillary and laboratory) are listed below for reference.    Significant Diagnostic Studies: US Renal  09/13/2015  CLINICAL DATA:  Acute renal insufficiency.  Initial encounter. EXAM: RENAL / URINARY TRACT ULTRASOUND COMPLETE COMPARISON:  None. FINDINGS: Right Kidney: Length: 8.6 cm 1.3 cm cystic lesion upper pole right kidney has some layering calcification or debris. Echogenicity appears mildly increased. No mass or hydronephrosis visualized. Left Kidney: Length: 9.2 cm. Echogenicity appears mildly increased. No mass or hydronephrosis visualized. Simple appearing cysts are identified measuring 1.8 and 2.4 cm respectively. Bladder:  Appears normal for degree of bladder distention. IMPRESSION: Mild increased echogenicity could be compatible with medical renal disease. No evidence for hydronephrosis. Electronically Signed   By: Misty Stanley M.D.   On: 09/13/2015 14:40   Dg Chest Portable 1 View  09/11/2015  CLINICAL DATA:  Shortness of breath. EXAM: PORTABLE CHEST 1 VIEW COMPARISON:  June 04, 2015 FINDINGS: There are bilateral pleural effusions with cardiomegaly and moderate interstitial edema. There is bibasilar airspace consolidation as well. There is extensive atherosclerotic change in aorta. Patient is status post coronary artery bypass grafting and aortic valve replacement. No bone lesions. No adenopathy appreciable. IMPRESSION: Congestive heart failure. Airspace consolidation in the bases may represent alveolar edema but also may represent superimposed pneumonia. Both congestive heart failure and pneumonia may present concurrently. Electronically Signed   By: Lowella Grip III M.D.    On: 09/11/2015 17:48    Microbiology: No results found for this or any previous visit (from the past 240 hour(s)).   Labs: Basic Metabolic Panel:  Recent Labs Lab 09/13/15 1025 09/14/15 0506 09/15/15 1120 09/17/15 1117 09/18/15 0457 09/19/15 0519  NA  --  136 136 137 134* 132*  K  --  3.6 2.9* 3.4* 2.6* 4.2  CL  --  101 96* 96* 91* 92*  CO2  --  _0 GLUCOSE  --  114* 105* 153* 106* 100*  BUN  --  25* 29* 40* 44* 44*  CREATININE  --  2.24* 2.37* 2.45* 2.61* 2.47*  CALCIUM  --  7.9* 8.5* 8.7* 8.8* 8.5*  MG 2.2  --   --   --   --  2.6*   Liver Function Tests: No results for input(s): AST, ALT, ALKPHOS, BILITOT, PROT, ALBUMIN in the last 168 hours. No results for input(s): LIPASE, AMYLASE in the last 168 hours. No results for input(s): AMMONIA in the last 168 hours. CBC: No results for input(s): WBC, NEUTROABS, HGB, HCT, MCV, PLT in the last 168 hours. Cardiac Enzymes: No results for input(s): CKTOTAL, CKMB, CKMBINDEX, TROPONINI in the last 168 hours. BNP: BNP (last 3 results)  Recent Labs  06/04/15 1157 09/11/15 1715 09/14/15 0506  BNP 925.3* 1681.0* 1257.4*    ProBNP (last 3 results) No results for input(s): PROBNP in the last 8760 hours.  CBG: No results for input(s): GLUCAP in the last 168 hours.   Signed:  [name] MD  FACP  [facility] 09/19/2015, 10:50 AM

## 2015-09-19 NOTE — Progress Notes (Signed)
Pt has orders to be discharged. Discharge instructions given and pt has no additional questions at this time. Medication regimen reviewed and pt educated. Prescriptions faxed to pharmacy and paper prescriptions given. Pt verbalized understanding and has no additional questions. Telemetry box removed. IVs removed and sites in good condition. Pt stable and waiting for transportation.

## 2015-09-20 ENCOUNTER — Other Ambulatory Visit: Payer: Self-pay | Admitting: *Deleted

## 2015-09-20 NOTE — Patient Outreach (Signed)
[facility]) Care Management  09/20/2015  GEOFFREY NEDVED 1921/02/11 [id number]   Assessment: Transition of care - week 1 (first attempt) Referral from hospital liaison Eliott Nine) for transition of care calls and assess for home visits. Call placed to speak with patient but unable to reach her. HIPAA compliant voice message left with name and contact information.  Plan: Will await for return call. If unable to receive a call back from patient, will schedule for next outreach call.  Paymon Rosensteel A. Kassidi Elza, BSN, RN-BC [place] Management Coordinator Cell: [phone]

## 2015-09-23 DIAGNOSIS — M6281 Muscle weakness (generalized): Secondary | ICD-10-CM | POA: Diagnosis not present

## 2015-09-23 DIAGNOSIS — Z9181 History of falling: Secondary | ICD-10-CM | POA: Diagnosis not present

## 2015-09-23 DIAGNOSIS — H3530 Unspecified macular degeneration: Secondary | ICD-10-CM | POA: Diagnosis not present

## 2015-09-23 DIAGNOSIS — I130 Hypertensive heart and chronic kidney disease with heart failure and stage 1 through stage 4 chronic kidney disease, or unspecified chronic kidney disease: Secondary | ICD-10-CM | POA: Diagnosis not present

## 2015-09-23 DIAGNOSIS — E785 Hyperlipidemia, unspecified: Secondary | ICD-10-CM | POA: Diagnosis not present

## 2015-09-23 DIAGNOSIS — G250 Essential tremor: Secondary | ICD-10-CM | POA: Diagnosis not present

## 2015-09-23 DIAGNOSIS — E042 Nontoxic multinodular goiter: Secondary | ICD-10-CM | POA: Diagnosis not present

## 2015-09-23 DIAGNOSIS — N6019 Diffuse cystic mastopathy of unspecified breast: Secondary | ICD-10-CM | POA: Diagnosis not present

## 2015-09-23 DIAGNOSIS — M810 Age-related osteoporosis without current pathological fracture: Secondary | ICD-10-CM | POA: Diagnosis not present

## 2015-09-23 DIAGNOSIS — I2510 Atherosclerotic heart disease of native coronary artery without angina pectoris: Secondary | ICD-10-CM | POA: Diagnosis not present

## 2015-09-23 DIAGNOSIS — K219 Gastro-esophageal reflux disease without esophagitis: Secondary | ICD-10-CM | POA: Diagnosis not present

## 2015-09-23 DIAGNOSIS — I5032 Chronic diastolic (congestive) heart failure: Secondary | ICD-10-CM | POA: Diagnosis not present

## 2015-09-23 DIAGNOSIS — Z952 Presence of prosthetic heart valve: Secondary | ICD-10-CM | POA: Diagnosis not present

## 2015-09-23 DIAGNOSIS — Z7982 Long term (current) use of aspirin: Secondary | ICD-10-CM | POA: Diagnosis not present

## 2015-09-23 DIAGNOSIS — N184 Chronic kidney disease, stage 4 (severe): Secondary | ICD-10-CM | POA: Diagnosis not present

## 2015-09-23 DIAGNOSIS — R262 Difficulty in walking, not elsewhere classified: Secondary | ICD-10-CM | POA: Diagnosis not present

## 2015-09-23 DIAGNOSIS — Z951 Presence of aortocoronary bypass graft: Secondary | ICD-10-CM | POA: Diagnosis not present

## 2015-09-24 ENCOUNTER — Other Ambulatory Visit: Payer: Self-pay | Admitting: *Deleted

## 2015-09-24 NOTE — Patient Outreach (Signed)
[name]) Care Management  09/24/2015  Victoria Banks 18-Oct-1920 [id number]   Assessment: Transition of care follow-up call (second attempt) Unable to receive any call back from patient to message left last week. Call placed to patient but unable to reach her. HIPAA compliant voice message left with name and contact information.    Plan: Will await for return call from patient. If unable to receive a return call, will schedule for next outreach call.  Makita Blow A. Jaislyn Blinn, BSN, RN-BC [place] Management Coordinator Cell: [phone]

## 2015-09-25 ENCOUNTER — Other Ambulatory Visit: Payer: Self-pay | Admitting: *Deleted

## 2015-09-25 DIAGNOSIS — I130 Hypertensive heart and chronic kidney disease with heart failure and stage 1 through stage 4 chronic kidney disease, or unspecified chronic kidney disease: Secondary | ICD-10-CM | POA: Diagnosis not present

## 2015-09-25 DIAGNOSIS — N184 Chronic kidney disease, stage 4 (severe): Secondary | ICD-10-CM | POA: Diagnosis not present

## 2015-09-25 DIAGNOSIS — I5032 Chronic diastolic (congestive) heart failure: Secondary | ICD-10-CM | POA: Diagnosis not present

## 2015-09-25 DIAGNOSIS — R262 Difficulty in walking, not elsewhere classified: Secondary | ICD-10-CM | POA: Diagnosis not present

## 2015-09-25 DIAGNOSIS — G250 Essential tremor: Secondary | ICD-10-CM | POA: Diagnosis not present

## 2015-09-25 DIAGNOSIS — M6281 Muscle weakness (generalized): Secondary | ICD-10-CM | POA: Diagnosis not present

## 2015-09-25 NOTE — Patient Outreach (Signed)
[facility]) Care Management  09/25/2015  Victoria Banks 1921/01/14 [id number]   Assessment: Transition of care follow-up - week 1 (third attempt)  Referral from hospital liaison Eliott Nine) for transition of care calls, assess for home visits (post hospital monitoring). Patient had recent hospitalization (12/14- 12/22) for shortness of breath - acute on chronic congestive heart failure. Patient resides at [facility] facility.   Unable to receive any return call from patient to messages left on her voicemail box. Call placed and spoke with patient. Care management coordinator introduced self and explained the purpose of the call. According to patient, she "does not believe it is necessary for anymore services at this time". Patient was made aware of her eligibility to the program at no cost. She states that all she wants is "to get stronger" which [facility] is providing her.  Patient also verbalized "I don't think I need too many people coming in, which makes it so complicated and confusing for me". Patient was not receptive to further explanations and was adamant in refusing [facility] services. She also told care management coordinator that it is alright to notify her primary care provider about her refusal to services offered.  Victoria Banks further states, "one company is enough right now, thank you but I'm not interested to anything more".   Patient confirmed with care management coordinator that she understands her diet order, she has the medication supplies ordered for her and taking it as instructed. She mentioned having a scheduled appointment to see her primary care provider and cardiologist. Transportation is provided by daughter as stated by patient. She denies of any urgent needs or concerns at this time.  Contact information for [facility], care management coordinator and 24-hour nurse line provided to patient and encouraged to  call if she changes her mind or if future needs arise. Patient agreed. Call placed to primary provider's office (Dr. Felipa Eth) to notify of patient's refusal to [facility] services offered.  Plan: Will close case.  Will notify primary care provider of case closure.   Kenwood Rosiak A. Maytte Jacot, BSN, RN-BC [place] Management Coordinator Cell: [phone]

## 2015-09-26 ENCOUNTER — Encounter: Payer: Self-pay | Admitting: *Deleted

## 2015-09-27 DIAGNOSIS — I130 Hypertensive heart and chronic kidney disease with heart failure and stage 1 through stage 4 chronic kidney disease, or unspecified chronic kidney disease: Secondary | ICD-10-CM | POA: Diagnosis not present

## 2015-09-27 DIAGNOSIS — N184 Chronic kidney disease, stage 4 (severe): Secondary | ICD-10-CM | POA: Diagnosis not present

## 2015-09-27 DIAGNOSIS — R262 Difficulty in walking, not elsewhere classified: Secondary | ICD-10-CM | POA: Diagnosis not present

## 2015-09-27 DIAGNOSIS — M6281 Muscle weakness (generalized): Secondary | ICD-10-CM | POA: Diagnosis not present

## 2015-09-27 DIAGNOSIS — G250 Essential tremor: Secondary | ICD-10-CM | POA: Diagnosis not present

## 2015-09-27 DIAGNOSIS — I5032 Chronic diastolic (congestive) heart failure: Secondary | ICD-10-CM | POA: Diagnosis not present

## 2015-09-30 DIAGNOSIS — M6281 Muscle weakness (generalized): Secondary | ICD-10-CM | POA: Diagnosis not present

## 2015-09-30 DIAGNOSIS — R262 Difficulty in walking, not elsewhere classified: Secondary | ICD-10-CM | POA: Diagnosis not present

## 2015-09-30 DIAGNOSIS — G250 Essential tremor: Secondary | ICD-10-CM | POA: Diagnosis not present

## 2015-09-30 DIAGNOSIS — I130 Hypertensive heart and chronic kidney disease with heart failure and stage 1 through stage 4 chronic kidney disease, or unspecified chronic kidney disease: Secondary | ICD-10-CM | POA: Diagnosis not present

## 2015-09-30 DIAGNOSIS — N184 Chronic kidney disease, stage 4 (severe): Secondary | ICD-10-CM | POA: Diagnosis not present

## 2015-09-30 DIAGNOSIS — I5032 Chronic diastolic (congestive) heart failure: Secondary | ICD-10-CM | POA: Diagnosis not present

## 2015-10-01 DIAGNOSIS — N184 Chronic kidney disease, stage 4 (severe): Secondary | ICD-10-CM | POA: Diagnosis not present

## 2015-10-01 DIAGNOSIS — I5032 Chronic diastolic (congestive) heart failure: Secondary | ICD-10-CM | POA: Diagnosis not present

## 2015-10-01 DIAGNOSIS — M6281 Muscle weakness (generalized): Secondary | ICD-10-CM | POA: Diagnosis not present

## 2015-10-01 DIAGNOSIS — G250 Essential tremor: Secondary | ICD-10-CM | POA: Diagnosis not present

## 2015-10-01 DIAGNOSIS — I130 Hypertensive heart and chronic kidney disease with heart failure and stage 1 through stage 4 chronic kidney disease, or unspecified chronic kidney disease: Secondary | ICD-10-CM | POA: Diagnosis not present

## 2015-10-01 DIAGNOSIS — R262 Difficulty in walking, not elsewhere classified: Secondary | ICD-10-CM | POA: Diagnosis not present

## 2015-10-02 ENCOUNTER — Emergency Department (HOSPITAL_COMMUNITY): Payer: Medicare Other

## 2015-10-02 ENCOUNTER — Encounter (HOSPITAL_COMMUNITY): Payer: Self-pay | Admitting: Emergency Medicine

## 2015-10-02 ENCOUNTER — Inpatient Hospital Stay (HOSPITAL_COMMUNITY)
Admission: EM | Admit: 2015-10-02 | Discharge: 2015-10-07 | DRG: 291 | Disposition: A | Discharge status: Home / Self Care | Payer: Medicare Other | Attending: Internal Medicine | Admitting: Internal Medicine

## 2015-10-02 DIAGNOSIS — K219 Gastro-esophageal reflux disease without esophagitis: Secondary | ICD-10-CM | POA: Diagnosis present

## 2015-10-02 DIAGNOSIS — E876 Hypokalemia: Secondary | ICD-10-CM | POA: Diagnosis not present

## 2015-10-02 DIAGNOSIS — R0600 Dyspnea, unspecified: Secondary | ICD-10-CM | POA: Diagnosis not present

## 2015-10-02 DIAGNOSIS — T502X5A Adverse effect of carbonic-anhydrase inhibitors, benzothiadiazides and other diuretics, initial encounter: Secondary | ICD-10-CM | POA: Diagnosis not present

## 2015-10-02 DIAGNOSIS — M810 Age-related osteoporosis without current pathological fracture: Secondary | ICD-10-CM | POA: Diagnosis present

## 2015-10-02 DIAGNOSIS — R600 Localized edema: Secondary | ICD-10-CM | POA: Diagnosis present

## 2015-10-02 DIAGNOSIS — Z888 Allergy status to other drugs, medicaments and biological substances status: Secondary | ICD-10-CM

## 2015-10-02 DIAGNOSIS — Z951 Presence of aortocoronary bypass graft: Secondary | ICD-10-CM

## 2015-10-02 DIAGNOSIS — Z7982 Long term (current) use of aspirin: Secondary | ICD-10-CM | POA: Diagnosis not present

## 2015-10-02 DIAGNOSIS — I5033 Acute on chronic diastolic (congestive) heart failure: Secondary | ICD-10-CM | POA: Diagnosis present

## 2015-10-02 DIAGNOSIS — Z79899 Other long term (current) drug therapy: Secondary | ICD-10-CM

## 2015-10-02 DIAGNOSIS — N179 Acute kidney failure, unspecified: Secondary | ICD-10-CM | POA: Diagnosis not present

## 2015-10-02 DIAGNOSIS — I110 Hypertensive heart disease with heart failure: Secondary | ICD-10-CM | POA: Diagnosis not present

## 2015-10-02 DIAGNOSIS — Z881 Allergy status to other antibiotic agents status: Secondary | ICD-10-CM | POA: Diagnosis not present

## 2015-10-02 DIAGNOSIS — Z66 Do not resuscitate: Secondary | ICD-10-CM | POA: Diagnosis present

## 2015-10-02 DIAGNOSIS — R7989 Other specified abnormal findings of blood chemistry: Secondary | ICD-10-CM

## 2015-10-02 DIAGNOSIS — Z882 Allergy status to sulfonamides status: Secondary | ICD-10-CM | POA: Diagnosis not present

## 2015-10-02 DIAGNOSIS — I248 Other forms of acute ischemic heart disease: Secondary | ICD-10-CM | POA: Diagnosis present

## 2015-10-02 DIAGNOSIS — R778 Other specified abnormalities of plasma proteins: Secondary | ICD-10-CM | POA: Diagnosis present

## 2015-10-02 DIAGNOSIS — I5032 Chronic diastolic (congestive) heart failure: Secondary | ICD-10-CM | POA: Diagnosis not present

## 2015-10-02 DIAGNOSIS — I272 Other secondary pulmonary hypertension: Secondary | ICD-10-CM | POA: Diagnosis present

## 2015-10-02 DIAGNOSIS — J9691 Respiratory failure, unspecified with hypoxia: Secondary | ICD-10-CM

## 2015-10-02 DIAGNOSIS — I509 Heart failure, unspecified: Secondary | ICD-10-CM

## 2015-10-02 DIAGNOSIS — I2510 Atherosclerotic heart disease of native coronary artery without angina pectoris: Secondary | ICD-10-CM | POA: Diagnosis present

## 2015-10-02 DIAGNOSIS — J9601 Acute respiratory failure with hypoxia: Secondary | ICD-10-CM | POA: Diagnosis present

## 2015-10-02 DIAGNOSIS — R0602 Shortness of breath: Secondary | ICD-10-CM | POA: Diagnosis not present

## 2015-10-02 DIAGNOSIS — Z954 Presence of other heart-valve replacement: Secondary | ICD-10-CM | POA: Diagnosis not present

## 2015-10-02 DIAGNOSIS — I130 Hypertensive heart and chronic kidney disease with heart failure and stage 1 through stage 4 chronic kidney disease, or unspecified chronic kidney disease: Principal | ICD-10-CM | POA: Diagnosis present

## 2015-10-02 DIAGNOSIS — I350 Nonrheumatic aortic (valve) stenosis: Secondary | ICD-10-CM | POA: Diagnosis present

## 2015-10-02 DIAGNOSIS — N189 Chronic kidney disease, unspecified: Secondary | ICD-10-CM

## 2015-10-02 DIAGNOSIS — Z953 Presence of xenogenic heart valve: Secondary | ICD-10-CM

## 2015-10-02 DIAGNOSIS — N184 Chronic kidney disease, stage 4 (severe): Secondary | ICD-10-CM | POA: Diagnosis present

## 2015-10-02 DIAGNOSIS — G250 Essential tremor: Secondary | ICD-10-CM | POA: Diagnosis not present

## 2015-10-02 DIAGNOSIS — I10 Essential (primary) hypertension: Secondary | ICD-10-CM

## 2015-10-02 DIAGNOSIS — M6281 Muscle weakness (generalized): Secondary | ICD-10-CM | POA: Diagnosis not present

## 2015-10-02 DIAGNOSIS — R262 Difficulty in walking, not elsewhere classified: Secondary | ICD-10-CM | POA: Diagnosis not present

## 2015-10-02 DIAGNOSIS — R069 Unspecified abnormalities of breathing: Secondary | ICD-10-CM | POA: Diagnosis not present

## 2015-10-02 LAB — COMPREHENSIVE METABOLIC PANEL
ALBUMIN: 3 g/dL — AB (ref 3.5–5.0)
ALK PHOS: 50 U/L (ref 38–126)
ALT: 24 U/L (ref 14–54)
ANION GAP: 11 (ref 5–15)
AST: 24 U/L (ref 15–41)
BUN: 32 mg/dL — ABNORMAL HIGH (ref 6–20)
CHLORIDE: 100 mmol/L — AB (ref 101–111)
CO2: 22 mmol/L (ref 22–32)
Calcium: 8.9 mg/dL (ref 8.9–10.3)
Creatinine, Ser: 2.39 mg/dL — ABNORMAL HIGH (ref 0.44–1.00)
GFR calc Af Amer: 19 mL/min — ABNORMAL LOW (ref >60)
GFR calc non Af Amer: 17 mL/min — ABNORMAL LOW (ref >60)
GLUCOSE: 119 mg/dL — AB (ref 65–99)
POTASSIUM: 5.1 mmol/L (ref 3.5–5.1)
SODIUM: 133 mmol/L — AB (ref 135–145)
Total Bilirubin: 0.4 mg/dL (ref 0.3–1.2)
Total Protein: 6.7 g/dL (ref 6.5–8.1)

## 2015-10-02 LAB — BRAIN NATRIURETIC PEPTIDE: B Natriuretic Peptide: 1383.6 pg/mL — ABNORMAL HIGH (ref 0.0–100.0)

## 2015-10-02 LAB — I-STAT TROPONIN, ED: Troponin i, poc: 0.05 ng/mL (ref 0.00–0.08)

## 2015-10-02 LAB — CBC
HEMATOCRIT: 31.7 % — AB (ref 36.0–46.0)
HEMOGLOBIN: 10.3 g/dL — AB (ref 12.0–15.0)
MCH: 27.5 pg (ref 26.0–34.0)
MCHC: 32.5 g/dL (ref 30.0–36.0)
MCV: 84.8 fL (ref 78.0–100.0)
Platelets: 283 10*3/uL (ref 150–400)
RBC: 3.74 MIL/uL — ABNORMAL LOW (ref 3.87–5.11)
RDW: 14.3 % (ref 11.5–15.5)
WBC: 8.4 10*3/uL (ref 4.0–10.5)

## 2015-10-02 LAB — MRSA PCR SCREENING: MRSA by PCR: NEGATIVE

## 2015-10-02 MED ORDER — SODIUM CHLORIDE 0.9 % IJ SOLN
3.0000 mL | Freq: Two times a day (BID) | INTRAMUSCULAR | Status: DC
  Administered 2015-10-02 – 2015-10-04 (×4): 3 mL via INTRAVENOUS

## 2015-10-02 MED ORDER — LORAZEPAM 0.5 MG PO TABS
0.5000 mg | ORAL_TABLET | Freq: Every day | ORAL | Status: DC
  Administered 2015-10-02 – 2015-10-06 (×5): 0.5 mg via ORAL
  Filled 2015-10-02 (×5): qty 1

## 2015-10-02 MED ORDER — HEPARIN SODIUM (PORCINE) 5000 UNIT/ML IJ SOLN
5000.0000 [IU] | Freq: Three times a day (TID) | INTRAMUSCULAR | Status: DC
  Administered 2015-10-02 – 2015-10-07 (×14): 5000 [IU] via SUBCUTANEOUS
  Filled 2015-10-02 (×14): qty 1

## 2015-10-02 MED ORDER — ACETAMINOPHEN 325 MG PO TABS
650.0000 mg | ORAL_TABLET | ORAL | Status: DC | PRN
Start: 2015-10-02 — End: 2015-10-07

## 2015-10-02 MED ORDER — NIFEDIPINE ER 60 MG PO TB24
60.0000 mg | ORAL_TABLET | Freq: Two times a day (BID) | ORAL | Status: DC
  Administered 2015-10-02 – 2015-10-07 (×10): 60 mg via ORAL
  Filled 2015-10-02 (×14): qty 1

## 2015-10-02 MED ORDER — LISINOPRIL 2.5 MG PO TABS
2.5000 mg | ORAL_TABLET | Freq: Every day | ORAL | Status: DC
  Administered 2015-10-02 – 2015-10-04 (×3): 2.5 mg via ORAL
  Filled 2015-10-02 (×3): qty 1

## 2015-10-02 MED ORDER — FUROSEMIDE 10 MG/ML IJ SOLN
100.0000 mg | INTRAVENOUS | Status: AC
  Administered 2015-10-02: 100 mg via INTRAVENOUS
  Filled 2015-10-02: qty 10

## 2015-10-02 MED ORDER — ASPIRIN 81 MG PO CHEW
81.0000 mg | CHEWABLE_TABLET | Freq: Every day | ORAL | Status: DC
  Administered 2015-10-03 – 2015-10-07 (×5): 81 mg via ORAL
  Filled 2015-10-02 (×5): qty 1

## 2015-10-02 MED ORDER — MELATONIN 5 MG PO TABS: 1.0000 | ORAL_TABLET | Freq: Every day | ORAL | Status: DC

## 2015-10-02 MED ORDER — ISOSORBIDE MONONITRATE ER 30 MG PO TB24
30.0000 mg | ORAL_TABLET | Freq: Every day | ORAL | Status: DC
  Administered 2015-10-03 – 2015-10-07 (×5): 30 mg via ORAL
  Filled 2015-10-02 (×5): qty 1

## 2015-10-02 MED ORDER — FUROSEMIDE 10 MG/ML IJ SOLN
80.0000 mg | Freq: Two times a day (BID) | INTRAMUSCULAR | Status: DC
  Administered 2015-10-02 – 2015-10-05 (×7): 80 mg via INTRAVENOUS
  Filled 2015-10-02 (×7): qty 8

## 2015-10-02 MED ORDER — KETOTIFEN FUMARATE 0.025 % OP SOLN
1.0000 [drp] | OPHTHALMIC | Status: DC | PRN
  Filled 2015-10-02: qty 5

## 2015-10-02 MED ORDER — METOLAZONE 5 MG PO TABS
5.0000 mg | ORAL_TABLET | Freq: Two times a day (BID) | ORAL | Status: DC
  Administered 2015-10-02 – 2015-10-07 (×10): 5 mg via ORAL
  Filled 2015-10-02 (×10): qty 1

## 2015-10-02 MED ORDER — NITROGLYCERIN 2 % TD OINT
0.5000 [in_us] | TOPICAL_OINTMENT | Freq: Once | TRANSDERMAL | Status: AC
  Administered 2015-10-02: 0.5 [in_us] via TOPICAL
  Filled 2015-10-02: qty 1

## 2015-10-02 MED ORDER — SODIUM CHLORIDE 0.9 % IV SOLN: 250.0000 mL | INTRAVENOUS | Status: DC | PRN

## 2015-10-02 MED ORDER — ONDANSETRON HCL 4 MG/2ML IJ SOLN: 4.0000 mg | Freq: Four times a day (QID) | INTRAMUSCULAR | Status: DC | PRN

## 2015-10-02 MED ORDER — PRAVASTATIN SODIUM 40 MG PO TABS
40.0000 mg | ORAL_TABLET | Freq: Every day | ORAL | Status: DC
  Administered 2015-10-02 – 2015-10-06 (×5): 40 mg via ORAL
  Filled 2015-10-02 (×5): qty 1

## 2015-10-02 MED ORDER — SODIUM CHLORIDE 0.9 % IJ SOLN: 3.0000 mL | INTRAMUSCULAR | Status: DC | PRN

## 2015-10-02 MED ORDER — CALCIUM CARBONATE-VITAMIN D 500-200 MG-UNIT PO TABS
1.0000 | ORAL_TABLET | Freq: Two times a day (BID) | ORAL | Status: DC
  Administered 2015-10-03 – 2015-10-07 (×9): 1 via ORAL
  Filled 2015-10-02 (×9): qty 1

## 2015-10-02 NOTE — ED Notes (Signed)
Pulled patient up in the bed.  Repositioned BiPap mask for comfort

## 2015-10-02 NOTE — Progress Notes (Signed)
RT note: Pt. transported from ED-Trauma-C to [id number] with no complications, RT covering unit made aware at bedside upon arrival to room to monitor.

## 2015-10-02 NOTE — ED Provider Notes (Signed)
CSN: [id number]     Arrival date & time 10/02/15  1438 History   First MD Initiated Contact with Patient 10/02/15 1459     Chief Complaint  Patient presents with  . Shortness of Breath     (Consider location/radiation/quality/duration/timing/severity/associated sxs/prior Treatment) HPI Comments: 82 year old female with extensive past medical history including CHF, CAD, hypertension, aortic valve replacement who presents with shortness of breath. History limited because of the patient's respiratory distress and obtained from her daughter and EMS. The patient has chronic shortness of breath but was brought here today because her shortness of breath has been worse since yesterday. For the past 2 days she hasn't been noted to have O2 saturations in the 60s when checked at her physical therapy. She was noted by EMS to have sats in the 70s on room air at her assisted living facility; O2 sats improved on nonrebreather. She has noticed worsening leg swelling this morning. She is also noted to her daughter to not have significant urine output today. She states she has been compliant with her medications including her Lasix. She denies any fever or significant cough. No vomiting. No chest pain or any other pain.  Patient is a 82 y.o. female presenting with shortness of breath. The history is provided by the patient and a relative.  Shortness of Breath   Past Medical History  Diagnosis Date  . Allergic rhinitis   . Hypertension   . Fibrocystic breast disease   . GERD (gastroesophageal reflux disease)   . Aortic stenosis, severe 08/2009    s/p AVR w pericardial tissue valve 10/2009  . Osteoporosis 12/2010    Reclast given  . Multinodular goiter   . Coronary artery disease 10/2009    S/P SVG to OM   . CRD (chronic renal disease), stage III   . Positive for microalbuminuria     On tevetan  . Benign familial tremor   . Atelectasis     scarring at basis on CXR  . Chronic diastolic (congestive) heart  failure (HCC)     a. EF of 55-60% in 08/2015   Past Surgical History  Procedure Laterality Date  . Aortic valve replacement  2011  . Coronary artery bypass graft  2011    SVG-OM   Family History  Problem Relation Age of Onset  . Coronary artery disease Mother   . Coronary artery disease Father   . Hyperlipidemia Daughter    Social History  Substance Use Topics  . Smoking status: Never Smoker   . Smokeless tobacco: None  . Alcohol Use: No   OB History    No data available     Review of Systems  Unable to perform ROS: Severe respiratory distress  Respiratory: Positive for shortness of breath.       Allergies  Aceon; Beta adrenergic blockers; Hydralazine hcl; Actonel; Allegra; Biaxin; Ciprofloxacin; Crestor; Fosamax; Lipitor; Promethazine hcl; Simvastatin; Sulfa antibiotics; and Welchol  Home Medications   Prior to Admission medications   Medication Sig Start Date End Date Taking? Authorizing Provider  aspirin 81 MG tablet Take 81 mg by mouth daily.    Historical Provider, MD  calcium citrate-vitamin D (CALCIUM CITRATE +) 315-200 MG-UNIT per tablet Take 1 tablet by mouth 2 (two) times daily.    Historical Provider, MD  furosemide (LASIX) 80 MG tablet Take 1 tablet (80 mg total) by mouth 2 (two) times daily. 09/19/15   [name], MD  ipratropium (ATROVENT) 0.03 % nasal spray Place 2 sprays into  both nostrils at bedtime as needed for rhinitis.     Historical Provider, MD  isosorbide mononitrate (IMDUR) 60 MG 24 hr tablet Take 1 tablet (60 mg total) by mouth daily. 09/19/15   [name], MD  ketotifen (THERA TEARS ALLERGY) 0.025 % ophthalmic solution Place 1 drop into both eyes as needed (for dry eyes).    Historical Provider, MD  LORazepam (ATIVAN) 0.5 MG tablet Take 0.5 mg by mouth at bedtime.     Historical Provider, MD  MELATONIN PO Take 1 tablet by mouth at bedtime.     Historical Provider, MD  Multiple Vitamins-Minerals (CENTRUM SILVER ULTRA WOMENS  PO) Take 1 tablet by mouth daily.    Historical Provider, MD  NIFEdipine (PROCARDIA XL/ADALAT-CC) 90 MG 24 hr tablet Take 1 tablet (90 mg total) by mouth 2 (two) times daily. 09/19/15   [name], MD  potassium chloride SA (K-DUR,KLOR-CON) 20 MEQ tablet TAKE 1 TABLET TWICE DAILY. 06/25/15   Larey Dresser, MD  pravastatin (PRAVACHOL) 40 MG tablet TAKE 1 TABLET IN THE P.M. 02/27/15   Shaune Pascal Bensimhon, MD   BP 160/74 mmHg  Pulse 74  Temp(Src) 97.7 F (36.5 C) (Axillary)  Resp 27  SpO2 100% Physical Exam  Constitutional: She is oriented to person, place, and time. She appears well-developed and well-nourished.  On bipap, tachypneic but awake and comfortable  HENT:  Head: Normocephalic and atraumatic.  Moist mucous membranes  Eyes: Conjunctivae are normal. Pupils are equal, round, and reactive to light.  Neck: Neck supple.  Cardiovascular: Normal rate, regular rhythm and normal heart sounds.   2/6 systolic murmur  Pulmonary/Chest: No respiratory distress. She has no wheezes.  Tachypneic, diminished BS w/ crackles in bases b/l  Abdominal: Soft. Bowel sounds are normal. She exhibits no distension. There is no tenderness.  Musculoskeletal:  2+ pitting edema b/l  Neurological: She is alert and oriented to person, place, and time.  Fluent speech  Skin: Skin is warm and dry.  Psychiatric: She has a normal mood and affect. Judgment normal.  Nursing note and vitals reviewed.   ED Course  .Critical Care Performed by: Sharlett Iles Authorized by: Sharlett Iles Total critical care time: 45 minutes Critical care time was exclusive of separately billable procedures and treating other patients. Critical care was necessary to treat or prevent imminent or life-threatening deterioration of the following conditions: respiratory failure and cardiac failure. Critical care was time spent personally by me on the following activities: discussions with consultants, development  of treatment plan with patient or surrogate, evaluation of patient's response to treatment, examination of patient, obtaining history from patient or surrogate, ordering and performing treatments and interventions, ordering and review of laboratory studies, ordering and review of radiographic studies, re-evaluation of patient's condition and review of old charts.   (including critical care time) Labs Review Labs Reviewed  COMPREHENSIVE METABOLIC PANEL  CBC  BRAIN NATRIURETIC PEPTIDE  I-STAT [place], ED    Imaging Review Dg Chest Portable 1 View  10/02/2015  CLINICAL DATA:  Respiratory distress/ congestive heart failure. Shortness of breath. EXAM: PORTABLE CHEST 1 VIEW COMPARISON:  September 11, 2015 FINDINGS: There is generalized interstitial edema with airspace consolidation in the bases. There are bilateral pleural effusions with cardiomegaly and pulmonary venous hypertension. Patient is status post coronary artery bypass grafting and aortic valve replacement. There is atherosclerotic change in the aorta. Bones appear osteoporotic. There is chronic acromioclavicular separation on the right with evidence of old trauma involving the  right lateral clavicle. IMPRESSION: Congestive heart failure. Cannot exclude superimposed pneumonia in the bases. The appearance is similar to most recent prior study. Electronically Signed   By: Lowella Grip III M.D.   On: 10/02/2015 15:25   I have personally reviewed and evaluated these lab results as part of my medical decision-making.   EKG Interpretation   Date/Time:  Wednesday October 02 2015 14:44:57 EST Ventricular Rate:  76 PR Interval:  181 QRS Duration: 95 QT Interval:  417 QTC Calculation: 469 R Axis:   78 Text Interpretation:  Sinus rhythm Atrial premature complex Probable left  atrial enlargement Anteroseptal infarct, age indeterminate Interpretation  limited secondary to artifact morphology similar to previous ekg Confirmed  by LITTLE MD,  RACHEL [phone]) on 10/02/2015 3:01:52 PM     Medications  nitroGLYCERIN (NITROGLYN) 2 % ointment 0.5 inch (not administered)  furosemide (LASIX) 100 mg in dextrose 5 % 50 mL IVPB (not administered)    MDM   Final diagnoses:  Acute on chronic congestive heart failure, unspecified congestive heart failure type (HCC)  Respiratory failure with hypoxia, unspecified chronicity ([facility])   Pt presents from assisted living facility with worsening shortness of breath and hypoxia in the setting of known CHF. Daughter notes that she was hospitalized earlier this month for CHF exacerbation. On exam after being on BiPAP for ~30 min, patient was tachypneic but mentating appropriately and in no acute distress. Diminished breath sounds with some crackles noted. O2 sat high 90s on 60% FiO2. No complaints of chest pain. EKG similar to previous. Gave the patient nitro paste and 100 mg IV Lasix and obtained above labs and chest x-ray. Chest x-ray shows CHF changes. Patient denies any fevers or cough thus PNA less likely.   Labs show normal troponin, BNP 1383, Cr 2.39 slightly higher than baseline. W/u suggests acute on chronic CHF. On reexamination, pt continues to [facility] appropriately on bipap w/ improved WOB. Discussed admission with [facility] hospitalist, Dr. Myna Hidalgo, and pt admitted to stepdown for further care.  Sharlett Iles, MD 10/03/15 [phone]

## 2015-10-02 NOTE — ED Notes (Signed)
Pt here from assistance living with c/o sob , sats were in the 70's on room air sats up on nrb to 94% pt is a chf pt

## 2015-10-02 NOTE — Progress Notes (Signed)
PHARMACIST - PHYSICIAN ORDER COMMUNICATION  CONCERNING: P&T Medication Policy on Herbal Medications  DESCRIPTION:  This patient's order for:  melatonin  has been noted.  This product(s) is classified as an "herbal" or natural product. Due to a lack of definitive safety studies or FDA approval, nonstandard manufacturing practices, plus the potential risk of unknown drug-drug interactions while on inpatient medications, the Pharmacy and Therapeutics Committee does not permit the use of "herbal" or natural products of this type within [place].   ACTION TAKEN: The pharmacy department is unable to verify this order at this time. Please reevaluate patient's clinical condition at discharge and address if the herbal or natural product(s) should be resumed at that time.   

## 2015-10-02 NOTE — H&P (Signed)
[facility] History and Physical  Victoria Banks [id number] DOB: 1921-02-15 DOA: 10/02/2015  Referring physician: ED physician PCP: Mathews Argyle, MD  Specialists:   Chief Complaint: Dyspnea   HPI: Victoria Banks is a 82 y.o. female with PMH of CAD status post coronary bypass graft, aortic disease status post bioprosthetic valve replacement, and chronic diastolic CHF who now presents from her ALF with dyspnea and hypoxia, worsening over the past week. She just been hospitalized 2 weeks ago with acute exacerbation of chronic diastolic CHF and his not yet had her follow-up appointment with cardiology. After the recent discharge, she was doing quite well, continuing to take Lasix 80 mg twice daily, but progressive dyspnea and lower extremity edema developed in his worsened progressively. Per report, PT at the patient's ALF noted O2 sat in the 60s with physical therapy last week, improving with rest. Patient denies any recent fevers, chills, chest pain, or palpitations. She does endorse orthopnea and PND. She notes decreased urine output over the past week but denies any change in diet or fluid intake.  In ED, patient was found to be saturating in the 70s on room air, recovering to the 90s when placed on nonrebreather. She was afebrile, tachypnea in the 30s, but with vital signs otherwise stable. She is given an 80 mg IV push of Lasix in the emergency department and nitroglycerin patch was placed for hypertension. Over the ensuing 1-2 hours, there was minimal urine output. Patient was placed on BiPAP and the hospitalists called for admission.  Where does patient live?   Assistant living facility   Can patient participate in ADLs?  Yes    Review of Systems:   General: no fevers, chills, sweats, weight change, or poor appetite.Fatigue HEENT: no blurry vision, hearing changes or sore throat Pulm: no cough or wheeze. Dyspnea with minimal exertion CV: no chest pain or palpitations Abd:  no nausea, vomiting, abdominal pain, diarrhea, or constipation GU: no dysuria, hematuria, increased urinary frequency, or urgency  Ext: b/l leg edema Neuro: no focal weakness, numbness, or tingling, no vision change or hearing loss Skin: no rash, no wounds MSK: No muscle spasm, no deformity, no red, hot, or swollen joint Heme: No easy bruising or bleeding Travel history: No recent long distant travel    Allergy:  Allergies  Allergen Reactions  . Aceon [Perindopril] Other (See Comments)    Chest Pain  . Beta Adrenergic Blockers Other (See Comments)    Serious cough both times she tried.  Marland Kitchen Hydralazine Hcl Cough  . Actonel [Risedronate Sodium] Other (See Comments)    Unknown  . Allegra [Fexofenadine] Other (See Comments)    BACK PAIN  . Biaxin [Clarithromycin] Nausea Only  . Ciprofloxacin Nausea Only  . Crestor [Rosuvastatin] Itching  . Fosamax [Alendronate Sodium] Other (See Comments)    Muscle pain  . Lipitor [Atorvastatin] Itching  . Promethazine Hcl Other (See Comments)    Unknown  . Simvastatin Rash  . Sulfa Antibiotics Other (See Comments)    GI Upset  . Welchol [Colesevelam Hcl] Rash    Past Medical History  Diagnosis Date  . Allergic rhinitis   . Hypertension   . Fibrocystic breast disease   . GERD (gastroesophageal reflux disease)   . Aortic stenosis, severe 08/2009    s/p AVR w pericardial tissue valve 10/2009  . Osteoporosis 12/2010    Reclast given  . Multinodular goiter   . Coronary artery disease 10/2009    S/P SVG to OM   .  CRD (chronic renal disease), stage III   . Positive for microalbuminuria     On tevetan  . Benign familial tremor   . Atelectasis     scarring at basis on CXR  . Chronic diastolic (congestive) heart failure (HCC)     a. EF of 55-60% in 08/2015    Past Surgical History  Procedure Laterality Date  . Aortic valve replacement  2011  . Coronary artery bypass graft  2011    SVG-OM    Social History:  reports that she has never  smoked. She does not have any smokeless tobacco history on file. She reports that she does not drink alcohol or use illicit drugs.  Family History:  Family History  Problem Relation Age of Onset  . Coronary artery disease Mother   . Coronary artery disease Father   . Hyperlipidemia Daughter      Prior to Admission medications   Medication Sig Start Date End Date Taking? Authorizing Provider  aspirin 81 MG tablet Take 81 mg by mouth daily.   Yes Historical Provider, MD  calcium citrate-vitamin D (CALCIUM CITRATE +) 315-200 MG-UNIT per tablet Take 1 tablet by mouth 2 (two) times daily.   Yes Historical Provider, MD  furosemide (LASIX) 80 MG tablet Take 1 tablet (80 mg total) by mouth 2 (two) times daily. 09/19/15  Yes [name], MD  isosorbide mononitrate (IMDUR) 60 MG 24 hr tablet Take 1 tablet (60 mg total) by mouth daily. 09/19/15  Yes [name], MD  LORazepam (ATIVAN) 0.5 MG tablet Take 0.5 mg by mouth at bedtime.    Yes Historical Provider, MD  MELATONIN PO Take 1 tablet by mouth at bedtime.    Yes Historical Provider, MD  Multiple Vitamins-Minerals (CENTRUM SILVER ULTRA WOMENS PO) Take 1 tablet by mouth daily.   Yes Historical Provider, MD  NIFEdipine (PROCARDIA XL/ADALAT-CC) 90 MG 24 hr tablet Take 1 tablet (90 mg total) by mouth 2 (two) times daily. 09/19/15  Yes [name], MD  potassium chloride SA (K-DUR,KLOR-CON) 20 MEQ tablet TAKE 1 TABLET TWICE DAILY. 06/25/15  Yes Larey Dresser, MD  pravastatin (PRAVACHOL) 40 MG tablet TAKE 1 TABLET IN THE P.M. 02/27/15  Yes Jolaine Artist, MD  ipratropium (ATROVENT) 0.03 % nasal spray Place 2 sprays into both nostrils at bedtime as needed for rhinitis.     Historical Provider, MD  ketotifen (THERA TEARS ALLERGY) 0.025 % ophthalmic solution Place 1 drop into both eyes as needed (for dry eyes).    Historical Provider, MD    Physical Exam: Filed Vitals:   10/02/15 1445 10/02/15 1455 10/02/15 1747  BP: 160/74  160/74 167/65  Pulse: 77 74 67  Temp: 97.7 F (36.5 C)    TempSrc: Axillary    Resp: 35 27 27  SpO2: 96% 100% 99%   General: In moderate respiratory distress on BiPAP with tachypnea  HEENT:       Eyes: PERRL, EOMI, no scleral icterus or conjunctival pallor.       ENT: No discharge from the ears or nose, no pharyngeal ulcers, petechiae or exudate.        Neck: Moderate NVD while seated upright, no bruit, no appreciable mass Heme: No cervical adenopathy, no pallor Cardiac: S1/S2, RRR, No murmurs, No gallops or rubs. Pulm: Labored breathing, tachypneic, good air movement, crackles midway up both lung fields. Abd: Soft, nondistended, nontender, no rebound pain or gaurding, no mass or organomegaly, BS present. Ext: b/l LE edema to  thighs. 1+DP/PT pulse bilaterally. Musculoskeletal: No gross deformity, no red, hot, swollen joints, no limitation in ROM  Skin: b/l LE hyperpigmentation in gaiter distribution, No other rashes or wounds on exposed surfaces  Neuro: Alert, oriented X3, cranial nerves II-XII grossly intact. No focal findings Psych: Patient is not overtly psychotic, appropriate mood and affect.  Labs on Admission:  Basic Metabolic Panel:  Recent Labs Lab 10/02/15 1545  NA 133*  K 5.1  CL 100*  CO2 22  GLUCOSE 119*  BUN 32*  CREATININE 2.39*  CALCIUM 8.9   Liver Function Tests:  Recent Labs Lab 10/02/15 1545  AST 24  ALT 24  ALKPHOS 50  BILITOT 0.4  PROT 6.7  ALBUMIN 3.0*   No results for input(s): LIPASE, AMYLASE in the last 168 hours. No results for input(s): AMMONIA in the last 168 hours. CBC:  Recent Labs Lab 10/02/15 1545  WBC 8.4  HGB 10.3*  HCT 31.7*  MCV 84.8  PLT 283   Cardiac Enzymes: No results for input(s): CKTOTAL, CKMB, CKMBINDEX, TROPONINI in the last 168 hours.  BNP (last 3 results)  Recent Labs  09/11/15 1715 09/14/15 0506 10/02/15 1545  BNP 1681.0* 1257.4* 1383.6*    ProBNP (last 3 results) No results for input(s): PROBNP  in the last 8760 hours.  CBG: No results for input(s): GLUCAP in the last 168 hours.  Radiological Exams on Admission: Dg Chest Portable 1 View  10/02/2015  CLINICAL DATA:  Respiratory distress/ congestive heart failure. Shortness of breath. EXAM: PORTABLE CHEST 1 VIEW COMPARISON:  September 11, 2015 FINDINGS: There is generalized interstitial edema with airspace consolidation in the bases. There are bilateral pleural effusions with cardiomegaly and pulmonary venous hypertension. Patient is status post coronary artery bypass grafting and aortic valve replacement. There is atherosclerotic change in the aorta. Bones appear osteoporotic. There is chronic acromioclavicular separation on the right with evidence of old trauma involving the right lateral clavicle. IMPRESSION: Congestive heart failure. Cannot exclude superimposed pneumonia in the bases. The appearance is similar to most recent prior study. Electronically Signed   By: Lowella Grip III M.D.   On: 10/02/2015 15:25    EKG: Independently reviewed.  Abnormal findings:     Sinus rhythm, not significantly different from priors   Assessment/Plan  1. Acute on chronic diastolic CHF  - Pt is acutely decomponsated  - BNP 1380 on admit, pulm edema on CXR, crackles b/l lung fields  - Not diuresing well in ED after 80 mg IV Lasix  - Concerned for renal function as below, but needs aggressive diuresis as she is in acute resp failure; renal fxn may actually improve following diuresis as CO and renal perfusion expected to improve - Will try combo thiazide and loop diuretic, metolazone and Lasix  - SLIV, fluid-restrict, strict I/Os, daily wts  - Check chemistries again in the am   2. Acute respiratory failure with hypoxia  - Secondary to acute on chronic CHF as above  - O2 sat reportedly in 60s during PT at ALF, was 70s here on arrival, improving with NRB  - Pt currently stable on BiPAP, trying to wean  - Titrate supplemental O2 for goal sat >92%     3. Hypertension - Will decrease pt's home-dose of nifedipine and Imdur as attempting aggressive diuresis to avoid hypotension  - Monitoring on telemetry - Will continue low dose lisinopril  - Adjust regimen as needed   4. AKI on CKD III-IV - Apparent baseline SCr is 1.5, is 2.4  on admission, but actually a little better than at the time of her recent hospital discharge - Anticipate bump in creatinine with diuresis, but pt in resp failure from vol o/l; expect SCr to improve over the next few days as fluid comes off and she returns to the Frank-Starling curve  - Avoiding nephrotoxins as feasible and avoiding hypotension  - Repeat chemistries in the am     DVT ppx: SQ Heparin   Code Status: DNR Family Communication:  Yes, patient's daughter at bed side Disposition Plan: Admit to inpatient   Date of Service 10/02/2015    Vianne Bulls, MD [facility] Pager [phone]  If 7PM-7AM, please contact night-coverage [email or web address] Password [email or web address] 10/02/2015, 7:23 PM

## 2015-10-02 NOTE — ED Notes (Signed)
Report called  

## 2015-10-03 ENCOUNTER — Encounter: Payer: Medicare Other | Admitting: Physician Assistant

## 2015-10-03 DIAGNOSIS — N179 Acute kidney failure, unspecified: Secondary | ICD-10-CM | POA: Diagnosis present

## 2015-10-03 DIAGNOSIS — R778 Other specified abnormalities of plasma proteins: Secondary | ICD-10-CM | POA: Diagnosis present

## 2015-10-03 DIAGNOSIS — N189 Chronic kidney disease, unspecified: Secondary | ICD-10-CM

## 2015-10-03 DIAGNOSIS — I272 Other secondary pulmonary hypertension: Secondary | ICD-10-CM

## 2015-10-03 DIAGNOSIS — R7989 Other specified abnormal findings of blood chemistry: Secondary | ICD-10-CM

## 2015-10-03 DIAGNOSIS — Z954 Presence of other heart-valve replacement: Secondary | ICD-10-CM

## 2015-10-03 DIAGNOSIS — I10 Essential (primary) hypertension: Secondary | ICD-10-CM | POA: Diagnosis present

## 2015-10-03 LAB — BASIC METABOLIC PANEL
Anion gap: 11 (ref 5–15)
BUN: 31 mg/dL — AB (ref 6–20)
CHLORIDE: 101 mmol/L (ref 101–111)
CO2: 24 mmol/L (ref 22–32)
CREATININE: 2.46 mg/dL — AB (ref 0.44–1.00)
Calcium: 8.7 mg/dL — ABNORMAL LOW (ref 8.9–10.3)
GFR calc Af Amer: 19 mL/min — ABNORMAL LOW (ref >60)
GFR calc non Af Amer: 16 mL/min — ABNORMAL LOW (ref >60)
Glucose, Bld: 120 mg/dL — ABNORMAL HIGH (ref 65–99)
POTASSIUM: 4 mmol/L (ref 3.5–5.1)
SODIUM: 136 mmol/L (ref 135–145)

## 2015-10-03 LAB — MAGNESIUM: MAGNESIUM: 2.2 mg/dL (ref 1.7–2.4)

## 2015-10-03 LAB — TROPONIN I
TROPONIN I: 0.05 ng/mL — AB (ref <0.031)
TROPONIN I: 0.07 ng/mL — AB (ref <0.031)
Troponin I: 0.06 ng/mL — ABNORMAL HIGH (ref <0.031)
Troponin I: 0.06 ng/mL — ABNORMAL HIGH (ref <0.031)

## 2015-10-03 LAB — PROTIME-INR
INR: 1.15 (ref 0.00–1.49)
PROTHROMBIN TIME: 14.9 s (ref 11.6–15.2)

## 2015-10-03 LAB — APTT: APTT: 33 s (ref 24–37)

## 2015-10-03 MED ORDER — CHLORHEXIDINE GLUCONATE 0.12 % MT SOLN
15.0000 mL | Freq: Two times a day (BID) | OROMUCOSAL | Status: DC
  Administered 2015-10-03 – 2015-10-06 (×5): 15 mL via OROMUCOSAL
  Filled 2015-10-03 (×7): qty 15

## 2015-10-03 MED ORDER — CETYLPYRIDINIUM CHLORIDE 0.05 % MT LIQD
7.0000 mL | Freq: Two times a day (BID) | OROMUCOSAL | Status: DC
  Administered 2015-10-03 – 2015-10-05 (×4): 7 mL via OROMUCOSAL

## 2015-10-03 NOTE — Progress Notes (Signed)
[facility]  Patient Status: Active (receiving services up to time of hospitalization)  [facility] is providing the following services: RN, PT and OT  If patient discharges after hours, please call [phone].   Victoria Banks 10/03/2015, 10:27 AM

## 2015-10-03 NOTE — Progress Notes (Deleted)
Cardiology Office Note   Date:  10/03/2015   ID:  Victoria Banks, DOB 07-12-1921, MRN [id number]  PCP:  Mathews Argyle, MD  Cardiologist:  Dr [place]  Barrett, Rhonda, PA-C   No chief complaint on file.   History of Present Illness: Victoria Banks is a 82 y.o. female with a history of D-CHF, HTN, CAD (s/p CABG in 2011 with SVG-OM), s/p AVR in 2011, and Stage 3 CKD (baseline 1.6-1.8)   Admitted 12/14-12/22 w/ CHF, d/c weight 114 lbs  Victoria Banks presents for ***   Past Medical History  Diagnosis Date  . Allergic rhinitis   . Hypertension   . Fibrocystic breast disease   . GERD (gastroesophageal reflux disease)   . Aortic stenosis, severe 08/2009    s/p AVR w pericardial tissue valve 10/2009  . Osteoporosis 12/2010    Reclast given  . Multinodular goiter   . Coronary artery disease 10/2009    S/P SVG to OM   . CRD (chronic renal disease), stage III   . Positive for microalbuminuria     On tevetan  . Benign familial tremor   . Atelectasis     scarring at basis on CXR  . Chronic diastolic (congestive) heart failure (HCC)     a. EF of 55-60% in 08/2015    Past Surgical History  Procedure Laterality Date  . Aortic valve replacement  2011  . Coronary artery bypass graft  2011    SVG-OM    No current facility-administered medications for this visit.   No current outpatient prescriptions on file.   Facility-Administered Medications Ordered in Other Visits  Medication Dose Route Frequency Provider Last Rate Last Dose  . 0.9 %  sodium chloride infusion  250 mL Intravenous PRN Vianne Bulls, MD      . acetaminophen (TYLENOL) tablet 650 mg  650 mg Oral Q4H PRN Vianne Bulls, MD      . antiseptic oral rinse (CPC / CETYLPYRIDINIUM CHLORIDE 0.05%) solution 7 mL  7 mL Mouth Rinse q12n4p Ilene Qua Opyd, MD      . aspirin chewable tablet 81 mg  81 mg Oral Daily Vianne Bulls, MD      . calcium-vitamin D (OSCAL WITH D) 500-200 MG-UNIT per tablet 1 tablet  1  tablet Oral BID WC Ilene Qua Opyd, MD      . chlorhexidine (PERIDEX) 0.12 % solution 15 mL  15 mL Mouth Rinse BID Ilene Qua Opyd, MD      . furosemide (LASIX) injection 80 mg  80 mg Intravenous Q12H Ilene Qua Opyd, MD   80 mg at 10/02/15 2258  . heparin injection 5,000 Units  5,000 Units Subcutaneous 3 times per day Vianne Bulls, MD   5,000 Units at 10/03/15 [id number]  . isosorbide mononitrate (IMDUR) 24 hr tablet 30 mg  30 mg Oral Daily Ilene Qua Opyd, MD      . ketotifen (ZADITOR) 0.025 % ophthalmic solution 1 drop  1 drop Both Eyes PRN Ilene Qua Opyd, MD      . lisinopril (PRINIVIL,ZESTRIL) tablet 2.5 mg  2.5 mg Oral Daily Vianne Bulls, MD   2.5 mg at 10/02/15 2109  . LORazepam (ATIVAN) tablet 0.5 mg  0.5 mg Oral QHS Ilene Qua Opyd, MD   0.5 mg at 10/02/15 2109  . metolazone (ZAROXOLYN) tablet 5 mg  5 mg Oral BID Vianne Bulls, MD   5 mg at 10/02/15 2109  . NIFEdipine (PROCARDIA-XL/ADALAT  CC) 24 hr tablet 60 mg  60 mg Oral BID Vianne Bulls, MD   60 mg at 10/02/15 2258  . ondansetron (ZOFRAN) injection 4 mg  4 mg Intravenous Q6H PRN Vianne Bulls, MD      . pravastatin (PRAVACHOL) tablet 40 mg  40 mg Oral q1800 Vianne Bulls, MD   40 mg at 10/02/15 2109  . sodium chloride 0.9 % injection 3 mL  3 mL Intravenous Q12H Vianne Bulls, MD   3 mL at 10/02/15 2109  . sodium chloride 0.9 % injection 3 mL  3 mL Intravenous PRN Vianne Bulls, MD        Allergies:   Aceon; Beta adrenergic blockers; Hydralazine hcl; Actonel; Allegra; Biaxin; Ciprofloxacin; Crestor; Fosamax; Lipitor; Promethazine hcl; Simvastatin; Sulfa antibiotics; and Welchol    Social History:  The patient  reports that she has never smoked. She does not have any smokeless tobacco history on file. She reports that she does not drink alcohol or use illicit drugs.   Family History:  The patient's family history includes Coronary artery disease in her father and mother; Hyperlipidemia in her daughter.    ROS:  Please see the  history of present illness. All other systems are reviewed and negative.    PHYSICAL EXAM: VS:  There were no vitals taken for this visit. , BMI There is no weight on file to calculate BMI. GEN: Well nourished, well developed, female in no acute distress HEENT: normal for age  Neck: no JVD, no carotid bruit, no masses Cardiac: RRR; no murmur, no rubs, or gallops Respiratory:  clear to auscultation bilaterally, normal work of breathing GI: soft, nontender, nondistended, + BS MS: no deformity or atrophy; no edema; distal pulses are 2+ in all 4 extremities  Skin: warm and dry, no rash Neuro:  Strength and sensation are intact Psych: euthymic mood, full affect   EKG:  EKG {ACTION; IS/IS [id number] ordered today. The ekg ordered today demonstrates ***   Recent Labs: 10/02/2015: ALT 24; B Natriuretic Peptide 1383.6*; Hemoglobin 10.3*; Platelets 283 10/03/2015: BUN 31*; Creatinine, Ser 2.46*; Magnesium 2.2; Potassium 4.0; Sodium 136    Lipid Panel    Component Value Date/Time   CHOL 201* 06/04/2015 1158   TRIG 201* 06/04/2015 1158   HDL 56 06/04/2015 1158   CHOLHDL 3.6 06/04/2015 1158   VLDL 40 06/04/2015 1158   LDLCALC 105* 06/04/2015 1158   LDLDIRECT 139.9 06/29/2013 0933     Wt Readings from Last 3 Encounters:  10/03/15 119 lb 7.8 oz (54.2 kg)  09/19/15 114 lb 10.2 oz (52 kg)  06/04/15 121 lb 12.8 oz (55.248 kg)     Other studies Reviewed: Additional studies/ records that were reviewed today include: ***.  ASSESSMENT AND PLAN:  1.  ***   Current medicines are reviewed at length with the patient today.  The patient {ACTIONS; HAS/DOES NOT HAVE:19233} concerns regarding medicines.  The following changes have been made:  {PLAN; NO CHANGE:13088:s}  Labs/ tests ordered today include: *** No orders of the defined types were placed in this encounter.     Disposition:   FU with ***  Signed, Lenoard Aden  10/03/2015 8:03 AM    [facility]  [facility], [place], [place]  [address] Phone: [phone]; Fax: [phone]

## 2015-10-03 NOTE — Progress Notes (Signed)
[facility] TEAM 1 - Stepdown/ICU TEAM Progress Note  Victoria Banks [id number] DOB: [birth year] DOA: 10/02/2015 PCP: Mathews Argyle, MD  Admit HPI / Brief Narrative: 82 y.o. WF PMHx  CAD S/P CABG, Aortic Dz, S/P Bioprosthetic valve replacement, Chronic Diastolic CHF, HTN, CKD stage III,   who now presents from her ALF with dyspnea and hypoxia, worsening over the past week. She just been hospitalized 2 weeks ago with acute exacerbation of chronic diastolic CHF and his not yet had her follow-up appointment with cardiology. After the recent discharge, she was doing quite well, continuing to take Lasix 80 mg twice daily, but progressive dyspnea and lower extremity edema developed in his worsened progressively. Per report, PT at the patient's ALF noted O2 sat in the 60s with physical therapy last week, improving with rest. Patient denies any recent fevers, chills, chest pain, or palpitations. She does endorse orthopnea and PND. She notes decreased urine output over the past week but denies any change in diet or fluid intake.  In ED, patient was found to be saturating in the 70s on room air, recovering to the 90s when placed on nonrebreather. She was afebrile, tachypnea in the 30s, but with vital signs otherwise stable. She is given an 80 mg IV push of Lasix in the emergency department and nitroglycerin patch was placed for hypertension. Over the ensuing 1-2 hours, there was minimal urine output. Patient was placed on BiPAP and the hospitalists called for admission.  HPI/Subjective: 1/5 A/O 4, states her base weight ~114 pounds (51.7 kg) when she left the hospital last week. States negative O2  When feeling well. Ambulates with walker since discharge last week.   Assessment/Plan: Acute on chronic diastolic CHF/Bioprosthetic AV  - Pt is acutely decomponsated  - BNP 1380 on admit, pulm edema on CXR, crackles b/l lung fields  -Concerned for renal function as below, but needs aggressive  diuresis as she is in acute resp failure; renal fxn may actually improve following diuresis as CO and renal perfusion expected to improve -Continue Lasix 80 mg BID + Zaroxolyn 5 mg BID; increased Lasix to TID in a.m. if diuresis does not pick up. - strict I/O[place] admission -954ml -Daily wts; admission bed weight= 54.4 kg               1/5 bed weight= 54.2 kg   Pulmonary hypertension -See CHF  Elevated troponin -Most likely demand ischemia, trend troponin  Acute respiratory failure with hypoxia  - Secondary to acute on chronic CHF as above  - Pt currently stable on [place] - Titrate supplemental O2 for goal sat >92%   Hypertension - Will decrease pt's home-dose of nifedipine and Imdur as attempting aggressive diuresis to avoid hypotension  - Will continue low dose lisinopril  - Adjust regimen as needed   Acute on chronic renal failure stage III-IV (Baseline Cr is 1.5) -Cr on admission= 2.4  -Patient fluid overloaded should improve with diuresis - Avoiding nephrotoxins as feasible and avoiding hypotension    Code Status: FULL Family Communication: no family present at time of exam Disposition Plan: Resolution CHF    Consultants: NA  Procedure/Significant Events: 01/11/2015 echocardiogram;- Left ventricle: mild LVH. -LVEF=55% to 60%.  Aortic valve: A bioprosthesis was present and functioning normally. - Pulmonary arteries: PA peak pressure: 43 mm Hg (S).-left pleural effusion   Culture NA  Antibiotics: NA  DVT prophylaxis: Subcutaneous heparin   Devices NA   LINES / TUBES:  NA    Continuous Infusions:  Objective: VITAL SIGNS: Temp: 97.9 F (36.6 C) (01/05 1546) Temp Source: Oral (01/05 1546) BP: 157/56 mmHg (01/05 1546) Pulse Rate: 72 (01/05 1546) SPO2; FIO2:   Intake/Output Summary (Last 24 hours) at 10/03/15 1927 Last data filed at 10/03/15 1826  Gross per 24 hour  Intake    750 ml  Output   2025 ml  Net  -1275 ml      Exam: General: A/O 4, positive acute respiratory distress (now on nasal cannula) Eyes: Negative headache, eye pain, double vision,negative scleral hemorrhage ENT: Negative Runny nose, negative ear pain, negative gingival bleeding, Neck:  Negative scars, masses, torticollis, lymphadenopathy, JVD Lungs: decreased breath sounds RLL, positive crackles LLL, negative wheezing/rhonchi  Cardiovascular: Regular rate and rhythm without murmur gallop or rub normal S1 and S2 Abdomen:negative abdominal pain, nondistended, positive soft, bowel sounds, no rebound, no ascites, no appreciable mass Extremities: No significant cyanosis, clubbing. Positive bilateral lower extremity pitting edema 1-2 + Psychiatric:  Negative depression, negative anxiety, negative fatigue, negative mania  Neurologic:  Cranial nerves II through XII intact, tongue/uvula midline, all extremities muscle strength 5/5, sensation intact throughout, negative dysarthria, negative expressive aphasia, negative receptive aphasia.   Data Reviewed: Basic Metabolic Panel:  Recent Labs Lab 10/02/15 1545 10/03/15 0010 10/03/15 0125  NA 133*  --  136  K 5.1  --  4.0  CL 100*  --  101  CO2 22  --  24  GLUCOSE 119*  --  120*  BUN 32*  --  31*  CREATININE 2.39*  --  2.46*  CALCIUM 8.9  --  8.7*  MG  --  2.2  --    Liver Function Tests:  Recent Labs Lab 10/02/15 1545  AST 24  ALT 24  ALKPHOS 50  BILITOT 0.4  PROT 6.7  ALBUMIN 3.0*   No results for input(s): LIPASE, AMYLASE in the last 168 hours. No results for input(s): AMMONIA in the last 168 hours. CBC:  Recent Labs Lab 10/02/15 1545  WBC 8.4  HGB 10.3*  HCT 31.7*  MCV 84.8  PLT 283   Cardiac Enzymes:  Recent Labs Lab 10/03/15 0010 10/03/15 0125 10/03/15 0744  TROPONINI 0.05* 0.06* 0.06*   BNP (last 3 results)  Recent Labs  09/11/15 1715 09/14/15 0506 10/02/15 1545  BNP 1681.0* 1257.4* 1383.6*    ProBNP (last 3 results) No results for  input(s): PROBNP in the last 8760 hours.  CBG: No results for input(s): GLUCAP in the last 168 hours.  Recent Results (from the past 240 hour(s))  MRSA PCR Screening     Status: None   Collection Time: 10/02/15  8:16 PM  Result Value Ref Range Status   MRSA by PCR NEGATIVE NEGATIVE Final    Comment:        The GeneXpert MRSA Assay (FDA approved for NASAL specimens only), is one component of a comprehensive MRSA colonization surveillance program. It is not intended to diagnose MRSA infection nor to guide or monitor treatment for MRSA infections.      Studies:  Recent x-ray studies have been reviewed in detail by the Attending Physician  Scheduled Meds:  Scheduled Meds: . antiseptic oral rinse  7 mL Mouth Rinse q12n4p  . aspirin  81 mg Oral Daily  . calcium-vitamin D  1 tablet Oral BID WC  . chlorhexidine  15 mL Mouth Rinse BID  . furosemide  80 mg Intravenous Q12H  . heparin  5,000 Units Subcutaneous 3 times per day  . isosorbide mononitrate  30 mg Oral Daily  . lisinopril  2.5 mg Oral Daily  . LORazepam  0.5 mg Oral QHS  . metolazone  5 mg Oral BID  . NIFEdipine  60 mg Oral BID  . pravastatin  40 mg Oral q1800  . sodium chloride  3 mL Intravenous Q12H    Time spent on care of this patient: 40 mins   WOODS, Geraldo Docker , MD  [facility] Office  [phone] Pager [phone]  On-Call/Text Page:      [email or web address]      password [email or web address]  If 7PM-7AM, please contact night-coverage [email or web address] Password [email or web address] 10/03/2015, 7:27 PM   LOS: 1 day   Care during the described time interval was provided by me .  I have reviewed this patient's available data, including medical history, events of note, physical examination, and all test results as part of my evaluation. I have personally reviewed and interpreted all radiology studies.   Dia Crawford, MD [phone] Pager

## 2015-10-03 NOTE — Care Management Note (Signed)
Case Management Note  Patient Details  Name: Victoria Banks MRN: [id number] Date of Birth: [birth year]  Subjective/Objective:          Adm w chf          Action/Plan: lives alone indep living, pcp dr Felipa Eth   Expected Discharge Date:                  Expected Discharge Plan:     In-House Referral:     Discharge planning Services     Post Acute Care Choice:    Choice offered to:     DME Arranged:    DME Agency:     HH Arranged:    [place] Agency:     Status of Service:     Medicare Important Message Given:    Date Medicare IM Given:    Medicare IM give by:    Date Additional Medicare IM Given:    Additional Medicare Important Message give by:     If discussed at [place] of Stay Meetings, dates discussed:    Additional Comments: ur review done  Lacretia Leigh, RN 10/03/2015, 7:41 AM

## 2015-10-04 LAB — COMPREHENSIVE METABOLIC PANEL
ALBUMIN: 2.7 g/dL — AB (ref 3.5–5.0)
ALK PHOS: 46 U/L (ref 38–126)
ALT: 18 U/L (ref 14–54)
ANION GAP: 15 (ref 5–15)
AST: 20 U/L (ref 15–41)
BILIRUBIN TOTAL: 0.2 mg/dL — AB (ref 0.3–1.2)
BUN: 41 mg/dL — ABNORMAL HIGH (ref 6–20)
CO2: 27 mmol/L (ref 22–32)
Calcium: 8.6 mg/dL — ABNORMAL LOW (ref 8.9–10.3)
Chloride: 94 mmol/L — ABNORMAL LOW (ref 101–111)
Creatinine, Ser: 2.4 mg/dL — ABNORMAL HIGH (ref 0.44–1.00)
GFR calc Af Amer: 19 mL/min — ABNORMAL LOW (ref >60)
GFR, EST NON AFRICAN AMERICAN: 16 mL/min — AB (ref >60)
Glucose, Bld: 87 mg/dL (ref 65–99)
POTASSIUM: 3 mmol/L — AB (ref 3.5–5.1)
Sodium: 136 mmol/L (ref 135–145)
TOTAL PROTEIN: 6 g/dL — AB (ref 6.5–8.1)

## 2015-10-04 LAB — CBC WITH DIFFERENTIAL/PLATELET
BASOS ABS: 0 10*3/uL (ref 0.0–0.1)
Basophils Relative: 1 %
EOS PCT: 6 %
Eosinophils Absolute: 0.4 10*3/uL (ref 0.0–0.7)
HEMATOCRIT: 32 % — AB (ref 36.0–46.0)
Hemoglobin: 10.9 g/dL — ABNORMAL LOW (ref 12.0–15.0)
LYMPHS PCT: 23 %
Lymphs Abs: 1.4 10*3/uL (ref 0.7–4.0)
MCH: 28.6 pg (ref 26.0–34.0)
MCHC: 34.1 g/dL (ref 30.0–36.0)
MCV: 84 fL (ref 78.0–100.0)
Monocytes Absolute: 0.4 10*3/uL (ref 0.1–1.0)
Monocytes Relative: 7 %
NEUTROS ABS: 3.9 10*3/uL (ref 1.7–7.7)
Neutrophils Relative %: 63 %
PLATELETS: 295 10*3/uL (ref 150–400)
RBC: 3.81 MIL/uL — AB (ref 3.87–5.11)
RDW: 14 % (ref 11.5–15.5)
WBC: 6.1 10*3/uL (ref 4.0–10.5)

## 2015-10-04 LAB — TROPONIN I
TROPONIN I: 0.09 ng/mL — AB (ref <0.031)
TROPONIN I: 0.08 ng/mL — AB (ref <0.031)

## 2015-10-04 LAB — MAGNESIUM: Magnesium: 2 mg/dL (ref 1.7–2.4)

## 2015-10-04 MED ORDER — POTASSIUM CHLORIDE CRYS ER 20 MEQ PO TBCR
40.0000 meq | EXTENDED_RELEASE_TABLET | Freq: Two times a day (BID) | ORAL | Status: AC
  Administered 2015-10-04 – 2015-10-05 (×3): 40 meq via ORAL
  Filled 2015-10-04 (×3): qty 2

## 2015-10-04 NOTE — Progress Notes (Addendum)
[facility] TEAM 1 - Stepdown/ICU TEAM PROGRESS NOTE  Victoria Banks [id number] DOB: 1920/09/30 DOA: 10/02/2015 PCP: Mathews Argyle, MD  Admit HPI / Brief Narrative: 82 yo F Hx CAD S/P CABG, Aortic Dz S/P Bioprosthetic valve replacement, Chronic Diastolic CHF, HTN, and CKD stage III who presented from her ALF with dyspnea and hypoxia, worsening over a week. She had been hospitalized 2 weeks prior with an acute exacerbation of chronic diastolic CHF. She was continuing to take Lasix 80 mg twice daily, but progressive dyspnea and lower extremity edema developed.   In ED patient was saturating in the 70s on room air, recovering to the 90s on nonrebreather.  Despite lasix there was minimal urine output. Patient was placed on BiPAP.  HPI/Subjective: Pt states she is feeling much better.  She denies cp, n/v, or abdom pain.  She is anxious to have her foley taken out, and to ambulate.  Patient is worried about getting "dehydrated."  Tells me if she "did what the heart doctor told her to do" she "would be dead."    Assessment/Plan:  Acute exacerbation of chronic diastolic CHF > acute hypoxic resp failure  -admit weight 54.4 kg - current weight not accurate (110kg?) - d/c weight 09/19/15 was 52kg - continues to require 6L [place] oxygen - net negative ~3L thus far - given statements from pt I suspect she is not fully compliant w/ medications as an outpt   Acute on chronic renal failure stage III-IV (Baseline Cr is 1.5) -Cr on admission 2.4 - stop ACE for now   Pulmonary hypertension -See CHF  Bioprosthetic AoV -valve function normal per TTE 09/12/2015  Hypokalemia  -due to diuresis - replace and follow - Mg normal   Elevated troponin -likely demand ischemia - troponin peaked at 0.08  HTN -follow BP trend w/ ongoing diuresis   Code Status: DNR Family Communication: no family present at time of exam Disposition Plan: transfer to CHF bed - diurese - educated - PT/OT evals    Consultants: none  Procedures: none  Antibiotics: none  DVT prophylaxis: SQ heparin   Objective: Blood pressure 147/68, pulse 70, temperature 98.6 F (37 C), temperature source Oral, resp. rate 19, height 5\' 1"  (1.549 m), weight 110.5 kg (243 lb 9.7 oz), SpO2 98 %.  Intake/Output Summary (Last 24 hours) at 10/04/15 1400 Last data filed at 10/04/15 0930  Gross per 24 hour  Intake    480 ml  Output   2650 ml  Net  -2170 ml   Exam: General: No acute respiratory distress at rest on supplemental O2 Lungs: mild bibasilar crackles - no wheeze  Cardiovascular: Regular rate and rhythm without murmur gallop or rub normal S1 and S2 Abdomen: Nontender, nondistended, soft, bowel sounds positive, no rebound, no ascites, no appreciable mass Extremities: No significant cyanosis, or clubbing;  Trace edema bilateral lower extremities  Data Reviewed:  Basic Metabolic Panel:  Recent Labs Lab 10/02/15 1545 10/03/15 0010 10/03/15 0125 10/04/15 0130  NA 133*  --  136 136  K 5.1  --  4.0 3.0*  CL 100*  --  101 94*  CO2 22  --  24 27  GLUCOSE 119*  --  120* 87  BUN 32*  --  31* 41*  CREATININE 2.39*  --  2.46* 2.40*  CALCIUM 8.9  --  8.7* 8.6*  MG  --  2.2  --  2.0    CBC:  Recent Labs Lab 10/02/15 1545 10/04/15 0130  WBC 8.4 6.1  NEUTROABS  --  3.9  HGB 10.3* 10.9*  HCT 31.7* 32.0*  MCV 84.8 84.0  PLT 283 295    Liver Function Tests:  Recent Labs Lab 10/02/15 1545 10/04/15 0130  AST 24 20  ALT 24 18  ALKPHOS 50 46  BILITOT 0.4 0.2*  PROT 6.7 6.0*  ALBUMIN 3.0* 2.7*   Coags:  Recent Labs Lab 10/03/15 0010  INR 1.15    Recent Labs Lab 10/03/15 0010  APTT 33    Cardiac Enzymes:  Recent Labs Lab 10/03/15 0125 10/03/15 0744 10/03/15 2039 10/04/15 0130 10/04/15 0849  TROPONINI 0.06* 0.06* 0.07* 0.09* 0.08*    Recent Results (from the past 240 hour(s))  MRSA PCR Screening     Status: None   Collection Time: 10/02/15  8:16 PM  Result  Value Ref Range Status   MRSA by PCR NEGATIVE NEGATIVE Final    Comment:        The GeneXpert MRSA Assay (FDA approved for NASAL specimens only), is one component of a comprehensive MRSA colonization surveillance program. It is not intended to diagnose MRSA infection nor to guide or monitor treatment for MRSA infections.      Studies:   Recent x-ray studies have been reviewed in detail by the Attending Physician  Scheduled Meds:  Scheduled Meds: . antiseptic oral rinse  7 mL Mouth Rinse q12n4p  . aspirin  81 mg Oral Daily  . calcium-vitamin D  1 tablet Oral BID WC  . chlorhexidine  15 mL Mouth Rinse BID  . furosemide  80 mg Intravenous Q12H  . heparin  5,000 Units Subcutaneous 3 times per day  . isosorbide mononitrate  30 mg Oral Daily  . lisinopril  2.5 mg Oral Daily  . LORazepam  0.5 mg Oral QHS  . metolazone  5 mg Oral BID  . NIFEdipine  60 mg Oral BID  . potassium chloride  40 mEq Oral BID  . pravastatin  40 mg Oral q1800  . sodium chloride  3 mL Intravenous Q12H    Time spent on care of this patient: 35 mins   Trevontae Lindahl T , MD   [facility] Office  [phone] Pager - Text Page per Shea Evans as per below:  On-Call/Text Page:      [email or web address]      password [email or web address]  If 7PM-7AM, please contact night-coverage [email or web address] Password [email or web address] 10/04/2015, 2:00 PM   LOS: 2 days

## 2015-10-04 NOTE — Progress Notes (Signed)
Pt complains of being "exhausted" when having to void often with IV lasix and worried about her 2230 dose. [facility]  NP notified and order placed to insert foley for aggressive diuresing.

## 2015-10-04 NOTE — Care Management Important Message (Signed)
Important Message  Patient Details  Name: Victoria Banks MRN: [id number] Date of Birth: 07-Mar-1921   Medicare Important Message Given:  Yes    Edgar Reisz P Honaker 10/04/2015, 2:20 PM

## 2015-10-04 NOTE — Progress Notes (Signed)
Met w pt and granda in room. Pt lives alone in her apt. She went home w [facility] but was only home 1 week prior to readm. Have asked for phy there eval. Pt/fam would like rec if pt able to return to her apt w [facility] or will she need short term snf. fam concerned may need home oxygen and explained md would ck this out prior to disch and if needed he will order. Will cont to follow for dc needs as pt progresses.

## 2015-10-05 DIAGNOSIS — N189 Chronic kidney disease, unspecified: Secondary | ICD-10-CM

## 2015-10-05 DIAGNOSIS — N184 Chronic kidney disease, stage 4 (severe): Secondary | ICD-10-CM

## 2015-10-05 DIAGNOSIS — I5033 Acute on chronic diastolic (congestive) heart failure: Secondary | ICD-10-CM

## 2015-10-05 DIAGNOSIS — J9601 Acute respiratory failure with hypoxia: Secondary | ICD-10-CM

## 2015-10-05 DIAGNOSIS — N179 Acute kidney failure, unspecified: Secondary | ICD-10-CM

## 2015-10-05 LAB — BASIC METABOLIC PANEL
Anion gap: 10 (ref 5–15)
BUN: 43 mg/dL — AB (ref 6–20)
CALCIUM: 8.7 mg/dL — AB (ref 8.9–10.3)
CHLORIDE: 89 mmol/L — AB (ref 101–111)
CO2: 30 mmol/L (ref 22–32)
CREATININE: 2.22 mg/dL — AB (ref 0.44–1.00)
GFR calc Af Amer: 21 mL/min — ABNORMAL LOW (ref >60)
GFR calc non Af Amer: 18 mL/min — ABNORMAL LOW (ref >60)
Glucose, Bld: 124 mg/dL — ABNORMAL HIGH (ref 65–99)
Potassium: 4.2 mmol/L (ref 3.5–5.1)
SODIUM: 129 mmol/L — AB (ref 135–145)

## 2015-10-05 MED ORDER — POTASSIUM CHLORIDE 10 MEQ/100ML IV SOLN
10.0000 meq | INTRAVENOUS | Status: AC
  Administered 2015-10-05 (×3): 10 meq via INTRAVENOUS
  Filled 2015-10-05 (×3): qty 100

## 2015-10-05 MED ORDER — MAGNESIUM HYDROXIDE 400 MG/5ML PO SUSP
15.0000 mL | Freq: Every evening | ORAL | Status: DC | PRN
  Administered 2015-10-05: 10 mL via ORAL
  Filled 2015-10-05: qty 30

## 2015-10-05 MED ORDER — FUROSEMIDE 10 MG/ML IJ SOLN
80.0000 mg | Freq: Two times a day (BID) | INTRAMUSCULAR | Status: DC
  Administered 2015-10-06 – 2015-10-07 (×3): 80 mg via INTRAVENOUS
  Filled 2015-10-05 (×3): qty 8

## 2015-10-05 NOTE — Evaluation (Addendum)
Physical Therapy Evaluation Patient Details Name: Victoria Banks MRN: [id number] DOB: 1921/01/06 Today's Date: 10/05/2015   History of Present Illness  82 yo female with recent admission to hospital returns from independent living at [facility] with shortness of breath "i was retaining fluid""  I couldn't  catch my breath " She did not use oxygen at home.   Clinical Impression  Pt is motivated to return to her apartment with home health PT and realized that she will probably need oxygen at home.  She feels generally weak and needs some assist especially on turns when walking with rolling walker. Anticipate she will continue to improve while in hospital with walking with nursing staff and continued PT/OT and should be able to return to home with HHPT and occasional daughter supervision     Follow Up Recommendations Home health PT    Equipment Recommendations  None recommended by PT    Recommendations for Other Services OT consult     Precautions / Restrictions Precautions Precautions: Fall Restrictions Weight Bearing Restrictions: No      Mobility  Bed Mobility               General bed mobility comments: pt sitting on edge of bed   Transfers Overall transfer level: Modified independent Equipment used: Rolling walker (2 wheeled)   Sit to Stand: Modified independent (Device/Increase time)         General transfer comment: occasional verbal cues, extra time   Ambulation/Gait Ambulation/Gait assistance: Min guard Ambulation Distance (Feet): 50 Feet Assistive device: Rolling walker (2 wheeled) Gait Pattern/deviations: Decreased step length - right;Decreased step length - left;Shuffle;Festinating;Narrow base of support Gait velocity:  (slow and guarded )   General Gait Details: pt needs occasional assist on turns. portable O2 at 4L with sats at 94% after walking   Stairs            Wheelchair Mobility    Modified Rankin (Stroke Patients Only)        Balance Overall balance assessment: Modified Independent Sitting-balance support: No upper extremity supported;Feet supported Sitting balance-Leahy Scale: Normal     Standing balance support: Single extremity supported Standing balance-Leahy Scale: Good                               Pertinent Vitals/Pain Pain Assessment: No/denies pain    Home Living Family/patient expects to be discharged to:: Private residence (Independent livinig apartment at [employer] ) Living Arrangements: Alone Available Help at Discharge: Available PRN/intermittently Type of Home: Independent living facility Home Access: Elevator     Home Layout: One level Home Equipment: Shower seat - built in (just got a 3 wheeled walker ) Additional Comments: Pt drives, but hasn't recently  Has one meal per day in the dining room and prepares the rest of meals.    Prior Function Level of Independence: Independent               Hand Dominance        Extremity/Trunk Assessment               Lower Extremity Assessment: Overall WFL for tasks assessed (pt says "I feel weak" )         Communication   Communication: No difficulties;HOH (2 hearing aids )  Cognition Arousal/Alertness: Awake/alert Behavior During Therapy: WFL for tasks assessed/performed Overall Cognitive Status: Within Functional Limits for tasks assessed  General Comments General comments (skin integrity, edema, etc.): iv in arm that is bothering patient, she reports that nose is stopped up and bothered by nasal O@     Exercises General Exercises - Lower Extremity Ankle Circles/Pumps: AROM;Both;Standing Gluteal Sets: Strengthening;Both;15 reps;Standing Other Exercises Other Exercises: abdominal sets and attentiont to posture in standing and walking    O2 sats at rest with 4L: 97% O2 sats with activity on room air: 86% O2 sats with activity with 4L of O2: 94%       Assessment/Plan    PT Assessment Patient needs continued PT services  PT Diagnosis Difficulty walking;Generalized weakness;Other (comment)   PT Problem List Decreased strength;Decreased activity tolerance;Decreased balance;Cardiopulmonary status limiting activity  PT Treatment Interventions DME instruction;Gait training;Therapeutic exercise;Balance training;Patient/family education;Other (comment) (monitor O2 sats)   PT Goals (Current goals can be found in the Care Plan section) Acute Rehab PT Goals Patient Stated Goal: to go back home PT Goal Formulation: With patient Time For Goal Achievement: 10/12/15 Potential to Achieve Goals: Good    Frequency Min 3X/week   Barriers to discharge Decreased caregiver support pt lives alone, but has daughter in [place] During Treatment: Gait belt;Oxygen Activity Tolerance: Patient tolerated treatment well Patient left: in chair;with call bell/phone within reach;with chair alarm set           Time: 0810-0855 PT Time Calculation (min) (ACUTE ONLY): 45 min   Charges:   PT Evaluation $PT Eval Moderate Complexity: 1 Procedure PT Treatments $Gait Training: 8-22 mins   PT G Codes:       Cena Bruhn K. Owens Shark, PT  10/05/2015, 9:13 AM

## 2015-10-05 NOTE — Progress Notes (Signed)
[facility]                                                                              Patient Demographics  Victoria Banks, is a 82 y.o. female, DOB - 1921/01/18, [id number]  Admit date - 10/02/2015   Admitting Physician Vianne Bulls, MD  Outpatient Primary MD for the patient is Mathews Argyle, MD  LOS - 3   Chief Complaint  Patient presents with  . Shortness of Breath       Brief HPI  82 yo F Hx CAD S/P CABG, Aortic Ds S/P Bioprosthetic valve replacement, Chronic Diastolic CHF, HTN, and CKD stage III who presented from her ALF with dyspnea and hypoxia, worsening over a week. She had been hospitalized 2 weeks prior with an acute exacerbation of chronic diastolic CHF. She was continuing to take Lasix 80 mg twice daily, but progressive dyspnea and lower extremity edema developed.  In ED patient was saturating in the 70s on room air, recovering to the 90s on nonrebreather. Despite lasix there was minimal urine output. Patient was placed on BiPAP and admitted to stepdown unit.   Assessment & Plan   Principal problem Acute hypoxic respiratory failure likely due to Acute exacerbation of chronic diastolic CHF  - Patient was admitted with hypoxia and needed to be placed on BiPAP at the time of admission. Does not use oxygen at home, currently O2 sats 98% on 4 L - Continue IV Lasix, negative balance of 3.5 L, weight down from 119-> 113 lbs - Follow BMET - Mean O2 as tolerated, home O2 evaluation   Acute on chronic renal failure stage III-IV (Baseline Cr is 1.5) -Cr on admission 2.4  - ACE inhibitor discontinued, follow BMET   Pulmonary hypertension -2-D echo 12/15 showed EF of 55-60%, PA pressure 43  Bioprosthetic AoV -valve function normal per TTE 09/12/2015  Hypokalemia  -due to diuresis, recheck BMET  Elevated troponin -likely demand ischemia - troponin peaked at 0.09  HTN - currently BP stable  Code Status:  DO NOT  RESUSCITATE  Family Communication: Discussed in detail with the patient, all imaging results, lab results explained to the patient    Disposition Plan: Hopefully DC next 24-48 hours  Time Spent in minutes   25 minutes  Procedures  None   Consults   None   DVT Prophylaxis heparin   Medications  Scheduled Meds: . antiseptic oral rinse  7 mL Mouth Rinse q12n4p  . aspirin  81 mg Oral Daily  . calcium-vitamin D  1 tablet Oral BID WC  . chlorhexidine  15 mL Mouth Rinse BID  . furosemide  80 mg Intravenous Q12H  . heparin  5,000 Units Subcutaneous 3 times per day  . isosorbide mononitrate  30 mg Oral Daily  . LORazepam  0.5 mg Oral QHS  . metolazone  5 mg Oral BID  . NIFEdipine  60 mg Oral BID  . potassium chloride  10 mEq Intravenous Q1 Hr x 3  . potassium chloride  40 mEq Oral BID  . pravastatin  40 mg Oral q1800   Continuous Infusions:  PRN Meds:.acetaminophen, ketotifen, ondansetron (ZOFRAN)  IV   Antibiotics   Anti-infectives    None        Subjective:   Victoria Banks was seen and examined today.  Patient denies dizziness, chest pain,  abdominal pain, N/V/D/C, new weakness, numbess, tingling. No acute events overnight.  Shortness of breath improving however still on 4 L O2  Objective:   Blood pressure 145/56, pulse 72, temperature 97.5 F (36.4 C), temperature source Oral, resp. rate 18, height 5\' 1"  (1.549 m), weight 51.347 kg (113 lb 3.2 oz), SpO2 98 %.  Wt Readings from Last 3 Encounters:  10/05/15 51.347 kg (113 lb 3.2 oz)  09/19/15 52 kg (114 lb 10.2 oz)  06/04/15 55.248 kg (121 lb 12.8 oz)     Intake/Output Summary (Last 24 hours) at 10/05/15 1021 Last data filed at 10/05/15 1000  Gross per 24 hour  Intake    600 ml  Output   1050 ml  Net   -450 ml    Exam  General: Alert and oriented x 3, NAD  HEENT:  PERRLA, EOMI, Anicteric Sclera, mucous membranes moist.   Neck: Supple, no JVD, no masses  CVS: S1 S2 auscultated, no rubs, murmurs or  gallops. Regular rate and rhythm.  Respiratory:  Decreased breath sounds at the bases  Abdomen: Soft, nontender, nondistended, + bowel sounds  Ext: no cyanosis clubbing, trace edema  Neuro: AAOx3, Cr N's II- XII. Strength 5/5 upper and lower extremities bilaterally  Skin: No rashes  Psych: Normal affect and demeanor, alert and oriented x3    Data Review   Micro Results Recent Results (from the past 240 hour(s))  MRSA PCR Screening     Status: None   Collection Time: 10/02/15  8:16 PM  Result Value Ref Range Status   MRSA by PCR NEGATIVE NEGATIVE Final    Comment:        The GeneXpert MRSA Assay (FDA approved for NASAL specimens only), is one component of a comprehensive MRSA colonization surveillance program. It is not intended to diagnose MRSA infection nor to guide or monitor treatment for MRSA infections.     Radiology Reports US Renal  09/13/2015  CLINICAL DATA:  Acute renal insufficiency.  Initial encounter. EXAM: RENAL / URINARY TRACT ULTRASOUND COMPLETE COMPARISON:  None. FINDINGS: Right Kidney: Length: 8.6 cm 1.3 cm cystic lesion upper pole right kidney has some layering calcification or debris. Echogenicity appears mildly increased. No mass or hydronephrosis visualized. Left Kidney: Length: 9.2 cm. Echogenicity appears mildly increased. No mass or hydronephrosis visualized. Simple appearing cysts are identified measuring 1.8 and 2.4 cm respectively. Bladder: Appears normal for degree of bladder distention. IMPRESSION: Mild increased echogenicity could be compatible with medical renal disease. No evidence for hydronephrosis. Electronically Signed   By: Misty Stanley M.D.   On: 09/13/2015 14:40   Dg Chest Portable 1 View  10/02/2015  CLINICAL DATA:  Respiratory distress/ congestive heart failure. Shortness of breath. EXAM: PORTABLE CHEST 1 VIEW COMPARISON:  September 11, 2015 FINDINGS: There is generalized interstitial edema with airspace consolidation in the bases.  There are bilateral pleural effusions with cardiomegaly and pulmonary venous hypertension. Patient is status post coronary artery bypass grafting and aortic valve replacement. There is atherosclerotic change in the aorta. Bones appear osteoporotic. There is chronic acromioclavicular separation on the right with evidence of old trauma involving the right lateral clavicle. IMPRESSION: Congestive heart failure. Cannot exclude superimposed pneumonia in the bases. The appearance is similar to most recent prior study. Electronically Signed   By: Gwyndolyn Saxon  Jasmine December III M.D.   On: 10/02/2015 15:25   Dg Chest Portable 1 View  09/11/2015  CLINICAL DATA:  Shortness of breath. EXAM: PORTABLE CHEST 1 VIEW COMPARISON:  June 04, 2015 FINDINGS: There are bilateral pleural effusions with cardiomegaly and moderate interstitial edema. There is bibasilar airspace consolidation as well. There is extensive atherosclerotic change in aorta. Patient is status post coronary artery bypass grafting and aortic valve replacement. No bone lesions. No adenopathy appreciable. IMPRESSION: Congestive heart failure. Airspace consolidation in the bases may represent alveolar edema but also may represent superimposed pneumonia. Both congestive heart failure and pneumonia may present concurrently. Electronically Signed   By: Lowella Grip III M.D.   On: 09/11/2015 17:48    CBC  Recent Labs Lab 10/02/15 1545 10/04/15 0130  WBC 8.4 6.1  HGB 10.3* 10.9*  HCT 31.7* 32.0*  PLT 283 295  MCV 84.8 84.0  MCH 27.5 28.6  MCHC 32.5 34.1  RDW 14.3 14.0  LYMPHSABS  --  1.4  MONOABS  --  0.4  EOSABS  --  0.4  BASOSABS  --  0.0    Chemistries   Recent Labs Lab 10/02/15 1545 10/03/15 0010 10/03/15 0125 10/04/15 0130  NA 133*  --  136 136  K 5.1  --  4.0 3.0*  CL 100*  --  101 94*  CO2 22  --  24 27  GLUCOSE 119*  --  120* 87  BUN 32*  --  31* 41*  CREATININE 2.39*  --  2.46* 2.40*  CALCIUM 8.9  --  8.7* 8.6*  MG  --  2.2   --  2.0  AST 24  --   --  20  ALT 24  --   --  18  ALKPHOS 50  --   --  46  BILITOT 0.4  --   --  0.2*   ------------------------------------------------------------------------------------------------------------------ estimated creatinine clearance is 11.3 mL/min (by C-G formula based on Cr of 2.4). ------------------------------------------------------------------------------------------------------------------ No results for input(s): HGBA1C in the last 72 hours. ------------------------------------------------------------------------------------------------------------------ No results for input(s): CHOL, HDL, LDLCALC, TRIG, CHOLHDL, LDLDIRECT in the last 72 hours. ------------------------------------------------------------------------------------------------------------------ No results for input(s): TSH, T4TOTAL, T3FREE, THYROIDAB in the last 72 hours.  Invalid input(s): FREET3 ------------------------------------------------------------------------------------------------------------------ No results for input(s): VITAMINB12, FOLATE, FERRITIN, TIBC, IRON, RETICCTPCT in the last 72 hours.  Coagulation profile  Recent Labs Lab 10/03/15 0010  INR 1.15    No results for input(s): DDIMER in the last 72 hours.  Cardiac Enzymes  Recent Labs Lab 10/03/15 2039 10/04/15 0130 10/04/15 0849  TROPONINI 0.07* 0.09* 0.08*   ------------------------------------------------------------------------------------------------------------------ Invalid input(s): POCBNP  No results for input(s): GLUCAP in the last 72 hours.   Frisco Cordts M.D. [facility] 10/05/2015, 10:21 AM  Pager: [phone] Between 7am to 7pm - call Pager - [phone]  After 7pm go to [email or web address] - password [email or web address]  Call night coverage person covering after 7pm

## 2015-10-06 LAB — BASIC METABOLIC PANEL
Anion gap: 12 (ref 5–15)
BUN: 56 mg/dL — AB (ref 6–20)
CALCIUM: 9.1 mg/dL (ref 8.9–10.3)
CO2: 30 mmol/L (ref 22–32)
CREATININE: 2.25 mg/dL — AB (ref 0.44–1.00)
Chloride: 86 mmol/L — ABNORMAL LOW (ref 101–111)
GFR calc non Af Amer: 18 mL/min — ABNORMAL LOW (ref >60)
GFR, EST AFRICAN AMERICAN: 21 mL/min — AB (ref >60)
Glucose, Bld: 102 mg/dL — ABNORMAL HIGH (ref 65–99)
Potassium: 3.3 mmol/L — ABNORMAL LOW (ref 3.5–5.1)
Sodium: 128 mmol/L — ABNORMAL LOW (ref 135–145)

## 2015-10-06 MED ORDER — POTASSIUM CHLORIDE CRYS ER 20 MEQ PO TBCR
20.0000 meq | EXTENDED_RELEASE_TABLET | Freq: Every day | ORAL | Status: DC
  Administered 2015-10-06: 20 meq via ORAL
  Filled 2015-10-06: qty 1

## 2015-10-06 NOTE — Progress Notes (Signed)
Family requested [name], patient's health care power of attorney be contacted by case management to discuss discharge needs prior to discharge.  Her contact information is listed in patient's chart.

## 2015-10-06 NOTE — Progress Notes (Signed)
02 evaluation completed for home 02 assessment. Results as follows:  02 at rest on room air, pre-ambulation = 94%.  Ambulated 150 feet on room air and 02 saturation decreased to 86%.  02 saturation up to 92% on 1L per nasal cannula at rest.

## 2015-10-06 NOTE — Progress Notes (Signed)
[facility]                                                                              Patient Demographics  Victoria Banks, is a 82 y.o. female, DOB - 1921-02-16, [id number]  Admit date - 10/02/2015   Admitting Physician Vianne Bulls, MD  Outpatient Primary MD for the patient is Mathews Argyle, MD  LOS - 4   Chief Complaint  Patient presents with  . Shortness of Breath       Brief HPI  82 yo F Hx CAD S/P CABG, Aortic Ds S/P Bioprosthetic valve replacement, Chronic Diastolic CHF, HTN, and CKD stage III who presented from her ALF with dyspnea and hypoxia, worsening over a week. She had been hospitalized 2 weeks prior with an acute exacerbation of chronic diastolic CHF. She was continuing to take Lasix 80 mg twice daily, but progressive dyspnea and lower extremity edema developed.  In ED patient was saturating in the 70s on room air, recovering to the 90s on nonrebreather. Despite lasix there was minimal urine output. Patient was placed on BiPAP and admitted to stepdown unit.   Assessment & Plan   Principal problem Acute hypoxic respiratory failure likely due to Acute exacerbation of chronic diastolic CHF  - Patient was admitted with hypoxia and needed to be placed on BiPAP at the time of admission. Does not use oxygen at home, currently O2 sats 98% on 4 L - Continue IV Lasix, negative balance of 4.9, weight down from 119-> 114 lbs - Pratt in stable, sodium trending down - Wean O2 as tolerated, home O2 evaluation, increase evolution  Acute on chronic renal failure stage III-IV (Baseline Cr is 1.5) -Cr on admission 2.4  - ACE inhibitor discontinued, creatinine stable and improving  Pulmonary hypertension -2-D echo 12/15 showed EF of 55-60%, PA pressure 43  Bioprosthetic AoV -valve function normal per TTE 09/12/2015  Hypokalemia  -due to diuresis, replaced  Elevated troponin -likely demand ischemia - troponin peaked at 0.09  HTN -  currently BP stable  Code Status:  DO NOT RESUSCITATE  Family Communication: Discussed in detail with the patient, all imaging results, lab results explained to the patient    Disposition Plan: Hopefully DC tomorrow if stable  Time Spent in minutes   15 minutes  Procedures  None   Consults   None   DVT Prophylaxis heparin   Medications  Scheduled Meds: . antiseptic oral rinse  7 mL Mouth Rinse q12n4p  . aspirin  81 mg Oral Daily  . calcium-vitamin D  1 tablet Oral BID WC  . chlorhexidine  15 mL Mouth Rinse BID  . furosemide  80 mg Intravenous BID  . heparin  5,000 Units Subcutaneous 3 times per day  . isosorbide mononitrate  30 mg Oral Daily  . LORazepam  0.5 mg Oral QHS  . metolazone  5 mg Oral BID  . NIFEdipine  60 mg Oral BID  . pravastatin  40 mg Oral q1800   Continuous Infusions:  PRN Meds:.acetaminophen, ketotifen, magnesium hydroxide, ondansetron (ZOFRAN) IV   Antibiotics   Anti-infectives    None  Subjective:   [name] was seen and examined today. He feels a lot better from the time of admission, no chest pain or fevers or chills. Shortness of breath is improving. Patient denies dizziness, chest pain,  abdominal pain, N/V/D/C, new weakness, numbess, tingling. No acute events overnight.  Still on 4 L O2 via nasal cannula  Objective:   Blood pressure 165/56, pulse 76, temperature 97.6 F (36.4 C), temperature source Oral, resp. rate 18, height 5\' 1"  (1.549 m), weight 51.891 kg (114 lb 6.4 oz), SpO2 96 %.  Wt Readings from Last 3 Encounters:  10/06/15 51.891 kg (114 lb 6.4 oz)  09/19/15 52 kg (114 lb 10.2 oz)  06/04/15 55.248 kg (121 lb 12.8 oz)     Intake/Output Summary (Last 24 hours) at 10/06/15 1134 Last data filed at 10/06/15 0916  Gross per 24 hour  Intake    880 ml  Output   2275 ml  Net  -1395 ml    Exam  General: Alert and oriented x 3, NAD  HEENT:  PERRLA, EOMI  Neck: Supple, no JVD, no masses  CVS: S1 S2 clear,  RRR  Respiratory:  Decreased breath sounds at the bases  Abdomen: Soft, NT, ND, NBS  Ext: no cyanosis clubbing, trace edema  Neuro: no new deficits  Skin: No rashes  Psych: Normal affect and demeanor, alert and oriented x3    Data Review   Micro Results Recent Results (from the past 240 hour(s))  MRSA PCR Screening     Status: None   Collection Time: 10/02/15  8:16 PM  Result Value Ref Range Status   MRSA by PCR NEGATIVE NEGATIVE Final    Comment:        The GeneXpert MRSA Assay (FDA approved for NASAL specimens only), is one component of a comprehensive MRSA colonization surveillance program. It is not intended to diagnose MRSA infection nor to guide or monitor treatment for MRSA infections.     Radiology Reports US Renal  09/13/2015  CLINICAL DATA:  Acute renal insufficiency.  Initial encounter. EXAM: RENAL / URINARY TRACT ULTRASOUND COMPLETE COMPARISON:  None. FINDINGS: Right Kidney: Length: 8.6 cm 1.3 cm cystic lesion upper pole right kidney has some layering calcification or debris. Echogenicity appears mildly increased. No mass or hydronephrosis visualized. Left Kidney: Length: 9.2 cm. Echogenicity appears mildly increased. No mass or hydronephrosis visualized. Simple appearing cysts are identified measuring 1.8 and 2.4 cm respectively. Bladder: Appears normal for degree of bladder distention. IMPRESSION: Mild increased echogenicity could be compatible with medical renal disease. No evidence for hydronephrosis. Electronically Signed   By: Misty Stanley M.D.   On: 09/13/2015 14:40   Dg Chest Portable 1 View  10/02/2015  CLINICAL DATA:  Respiratory distress/ congestive heart failure. Shortness of breath. EXAM: PORTABLE CHEST 1 VIEW COMPARISON:  September 11, 2015 FINDINGS: There is generalized interstitial edema with airspace consolidation in the bases. There are bilateral pleural effusions with cardiomegaly and pulmonary venous hypertension. Patient is status post  coronary artery bypass grafting and aortic valve replacement. There is atherosclerotic change in the aorta. Bones appear osteoporotic. There is chronic acromioclavicular separation on the right with evidence of old trauma involving the right lateral clavicle. IMPRESSION: Congestive heart failure. Cannot exclude superimposed pneumonia in the bases. The appearance is similar to most recent prior study. Electronically Signed   By: Lowella Grip III M.D.   On: 10/02/2015 15:25   Dg Chest Portable 1 View  09/11/2015  CLINICAL DATA:  Shortness of breath.  EXAM: PORTABLE CHEST 1 VIEW COMPARISON:  June 04, 2015 FINDINGS: There are bilateral pleural effusions with cardiomegaly and moderate interstitial edema. There is bibasilar airspace consolidation as well. There is extensive atherosclerotic change in aorta. Patient is status post coronary artery bypass grafting and aortic valve replacement. No bone lesions. No adenopathy appreciable. IMPRESSION: Congestive heart failure. Airspace consolidation in the bases may represent alveolar edema but also may represent superimposed pneumonia. Both congestive heart failure and pneumonia may present concurrently. Electronically Signed   By: Lowella Grip III M.D.   On: 09/11/2015 17:48    CBC  Recent Labs Lab 10/02/15 1545 10/04/15 0130  WBC 8.4 6.1  HGB 10.3* 10.9*  HCT 31.7* 32.0*  PLT 283 295  MCV 84.8 84.0  MCH 27.5 28.6  MCHC 32.5 34.1  RDW 14.3 14.0  LYMPHSABS  --  1.4  MONOABS  --  0.4  EOSABS  --  0.4  BASOSABS  --  0.0    Chemistries   Recent Labs Lab 10/02/15 1545 10/03/15 0010 10/03/15 0125 10/04/15 0130 10/05/15 1130 10/06/15 0311  NA 133*  --  136 136 129* 128*  K 5.1  --  4.0 3.0* 4.2 3.3*  CL 100*  --  101 94* 89* 86*  CO2 22  --  24 27 30 30   GLUCOSE 119*  --  120* 87 124* 102*  BUN 32*  --  31* 41* 43* 56*  CREATININE 2.39*  --  2.46* 2.40* 2.22* 2.25*  CALCIUM 8.9  --  8.7* 8.6* 8.7* 9.1  MG  --  2.2  --  2.0  --    --   AST 24  --   --  20  --   --   ALT 24  --   --  18  --   --   ALKPHOS 50  --   --  46  --   --   BILITOT 0.4  --   --  0.2*  --   --    ------------------------------------------------------------------------------------------------------------------ estimated creatinine clearance is 12 mL/min (by C-G formula based on Cr of 2.25). ------------------------------------------------------------------------------------------------------------------ No results for input(s): HGBA1C in the last 72 hours. ------------------------------------------------------------------------------------------------------------------ No results for input(s): CHOL, HDL, LDLCALC, TRIG, CHOLHDL, LDLDIRECT in the last 72 hours. ------------------------------------------------------------------------------------------------------------------ No results for input(s): TSH, T4TOTAL, T3FREE, THYROIDAB in the last 72 hours.  Invalid input(s): FREET3 ------------------------------------------------------------------------------------------------------------------ No results for input(s): VITAMINB12, FOLATE, FERRITIN, TIBC, IRON, RETICCTPCT in the last 72 hours.  Coagulation profile  Recent Labs Lab 10/03/15 0010  INR 1.15    No results for input(s): DDIMER in the last 72 hours.  Cardiac Enzymes  Recent Labs Lab 10/03/15 2039 10/04/15 0130 10/04/15 0849  TROPONINI 0.07* 0.09* 0.08*   ------------------------------------------------------------------------------------------------------------------ Invalid input(s): POCBNP  No results for input(s): GLUCAP in the last 72 hours.   Rosalie Buenaventura M.D. [facility] 10/06/2015, 11:34 AM  Pager: [phone] Between 7am to 7pm - call Pager - [phone]  After 7pm go to [email or web address] - password [email or web address]  Call night coverage person covering after 7pm

## 2015-10-07 DIAGNOSIS — E876 Hypokalemia: Secondary | ICD-10-CM

## 2015-10-07 LAB — BASIC METABOLIC PANEL
ANION GAP: 14 (ref 5–15)
BUN: 57 mg/dL — ABNORMAL HIGH (ref 6–20)
CALCIUM: 8.8 mg/dL — AB (ref 8.9–10.3)
CO2: 30 mmol/L (ref 22–32)
CREATININE: 2.28 mg/dL — AB (ref 0.44–1.00)
Chloride: 85 mmol/L — ABNORMAL LOW (ref 101–111)
GFR calc Af Amer: 20 mL/min — ABNORMAL LOW (ref >60)
GFR calc non Af Amer: 18 mL/min — ABNORMAL LOW (ref >60)
GLUCOSE: 109 mg/dL — AB (ref 65–99)
Potassium: 3.1 mmol/L — ABNORMAL LOW (ref 3.5–5.1)
Sodium: 129 mmol/L — ABNORMAL LOW (ref 135–145)

## 2015-10-07 MED ORDER — SALINE SPRAY 0.65 % NA SOLN
1.0000 | NASAL | Status: DC | PRN
  Filled 2015-10-07: qty 44

## 2015-10-07 MED ORDER — FUROSEMIDE 80 MG PO TABS: 80.0000 mg | ORAL_TABLET | Freq: Two times a day (BID) | ORAL | Status: DC

## 2015-10-07 MED ORDER — LORATADINE 10 MG PO TABS: 10.0000 mg | ORAL_TABLET | Freq: Every day | ORAL | Status: DC | PRN

## 2015-10-07 MED ORDER — LORATADINE 10 MG PO TABS
10.0000 mg | ORAL_TABLET | Freq: Every day | ORAL | Status: DC
Start: 2015-10-07 — End: 2015-10-07
  Administered 2015-10-07: 10 mg via ORAL
  Filled 2015-10-07: qty 1

## 2015-10-07 MED ORDER — METOLAZONE 2.5 MG PO TABS: 2.5000 mg | ORAL_TABLET | ORAL | Status: DC | PRN

## 2015-10-07 MED ORDER — METOLAZONE 5 MG PO TABS: 5.0000 mg | ORAL_TABLET | Freq: Two times a day (BID) | ORAL | Status: DC

## 2015-10-07 MED ORDER — SALINE SPRAY 0.65 % NA SOLN: 1.0000 | NASAL | Status: DC | PRN

## 2015-10-07 MED ORDER — FUROSEMIDE 80 MG PO TABS
80.0000 mg | ORAL_TABLET | Freq: Two times a day (BID) | ORAL | Status: DC
Start: 2015-10-07 — End: 2015-10-07

## 2015-10-07 MED ORDER — LORATADINE 10 MG PO TABS
10.0000 mg | ORAL_TABLET | Freq: Every day | ORAL | Status: DC | PRN
Start: 2015-10-07 — End: 2015-10-07

## 2015-10-07 MED ORDER — NIFEDIPINE ER 60 MG PO TB24: 60.0000 mg | ORAL_TABLET | Freq: Two times a day (BID) | ORAL | Status: DC

## 2015-10-07 MED ORDER — POTASSIUM CHLORIDE CRYS ER 20 MEQ PO TBCR
40.0000 meq | EXTENDED_RELEASE_TABLET | Freq: Every day | ORAL | Status: DC
  Administered 2015-10-07: 40 meq via ORAL
  Filled 2015-10-07: qty 2

## 2015-10-07 NOTE — Evaluation (Signed)
Occupational Therapy Evaluation and Discharge Patient Details Name: Victoria Banks MRN: [id number] DOB: 11-Aug-1921 Today's Date: 10/07/2015    History of Present Illness 82 yo female with recent admission to hospital returns from independent living at [facility] with shortness of breath "i was retaining fluid""  I couldn't  catch my breath " She did not use oxygen at home.    Clinical Impression   This 82 yo female admitted with above presents to acute OT at close to baseline per pt ("I just feel a little weak"). No further OT needs, we will sign off.    Follow Up Recommendations  No OT follow up    Equipment Recommendations  None recommended by OT       Precautions / Restrictions Precautions Precautions: Fall Restrictions Weight Bearing Restrictions: No      Mobility Bed Mobility               General bed mobility comments: pt sitting in recliner upon my arrival  Transfers Overall transfer level: Needs assistance Equipment used: Rolling walker (2 wheeled) Transfers: Sit to/from Stand Sit to Stand: Modified independent (Device/Increase time)         General transfer comment: Pt ambulated 100 feet with RW on RA with sats not lower on this occassion than 93%    Balance Overall balance assessment: Needs assistance Sitting-balance support: No upper extremity supported;Feet supported Sitting balance-Leahy Scale: Normal     Standing balance support: Single extremity supported Standing balance-Leahy Scale: Fair Standing balance comment: reliant on 1 UE to hold onto sink as she reached out of her base of support to turn on light over sink                            ADL Overall ADL's : Modified independent     Grooming: Wash/dry hands;Wash/dry face;Oral care;Standing;Modified independent               Lower Body Dressing: Modified independent;Sit to/from stand   Toilet Transfer: Modified Independent           Functional mobility  during ADLs: Modified independent (with RW) General ADL Comments: Pt did not need AE for LB ADLs, able to cross legs and get to feet without issues               Pertinent Vitals/Pain Pain Assessment: No/denies pain     and Dominance Right   Extremity/Trunk Assessment Upper Extremity Assessment Upper Extremity Assessment: Overall WFL for tasks assessed           Communication Communication Communication: HOH (Bil hearing aids)   Cognition Arousal/Alertness: Awake/alert Behavior During Therapy: WFL for tasks assessed/performed Overall Cognitive Status: Within Functional Limits for tasks assessed                                Home Living Family/patient expects to be discharged to:: Private residence (independent living [employer]) Living Arrangements: Alone Available Help at Discharge: Available PRN/intermittently Type of Home: Independent living facility Home Access: Elevator     Home Layout: One level     Bathroom Shower/Tub: Walk-in Hydrologist: Handicapped height     Home Equipment: Shower seat - built in (3 wheeled RW)   Additional Comments: Pt drives, but hasn't recently  Has one meal per day in the dining room and prepares the rest of meals.  Prior Functioning/Environment Level of Independence: Independent             OT Diagnosis: Generalized weakness   OT Problem List: Decreased strength   OT Treatment/Interventions:      OT Goals(Current goals can be found in the care plan section) Acute Rehab OT Goals Patient Stated Goal: to go home today  OT Frequency:                End of Session Equipment Utilized During Treatment: Rolling walker  Activity Tolerance: Patient tolerated treatment well Patient left: in chair;with call bell/phone within reach;with chair alarm set   Time: [id number] OT Time Calculation (min): 19 min Charges:  OT General Charges $OT Visit: 1 Procedure OT Evaluation $OT  Eval Low Complexity: 1 Procedure  Almon Register [id number] 10/07/2015, 1:53 PM

## 2015-10-07 NOTE — Progress Notes (Signed)
Patient discharged for home.  Reviewed all discharge instructions.  Prescriptions faxed to [facility] by Dr. Tana Coast, per patient request.Patient stated understanding of follow-up appointments, medications, and self-care activities to manage heart failure (daily weights, limiting salt and fluid intake, and taking medications).  No voiced complaints.

## 2015-10-07 NOTE — Progress Notes (Signed)
SATURATION QUALIFICATIONS: (This note is used to comply with regulatory documentation for home oxygen)  Patient Saturations on Room Air at Rest = 94%  Patient Saturations on Room Air while Ambulating = 86%  Patient Saturations on 1 Liters of oxygen while Ambulating = 92%  Please briefly explain why patient needs home oxygen:chf

## 2015-10-07 NOTE — Care Management Important Message (Signed)
Important Message  Patient Details  Name: Victoria Banks MRN: [id number] Date of Birth: March 17, 1921   Medicare Important Message Given:  Yes    Louanne Belton 10/07/2015, 1:16 PMImportant Message  Patient Details  Name: Victoria Banks MRN: [id number] Date of Birth: [birth year]   Medicare Important Message Given:  Yes    Candace Ramus G 10/07/2015, 1:15 PM

## 2015-10-07 NOTE — Consult Note (Signed)
Advanced Heart Failure Team Consult Note  Referring Physician: Dr Tana Coast Primary Physician: Dr Felipa Eth Primary Cardiologist:    Reason for Consultation: Heart Failure   HPI:    Victoria Banks is a 82 yo with history of bioprosthetic AVR 2011, CAD, HTN, multinodular goiter, chronic diastolic HF. .   Admitted in December with A/C diastolic HF. Diuresed with IV lasix. Discharged on lasix 80 mg twice a day. Discharge weight was 114 pounds.    Admitted to [facility] with acute respiratory failure and acute on chronic diastolic CHF on [id number]. Required short term bipap and IV diuresis. Overall she has diuresed 8 pounds. Prior to admit living at [facility]. Says she has scales.   Today she is feeling much better.  Denies SOB/orthopnea.    ECHO 09/12/2015: EF 55-60% AV mild stenosis. L pleural effusion.   Review of Systems: [y] = yes, [ ]  = no   General: Weight gain [ ] ; Weight loss [ ] ; Anorexia [ ] ; Fatigue [Y ]; Fever [ ] ; Chills [ ] ; Weakness [Y ]  Cardiac: Chest pain/pressure [ ] ; Resting SOB [ ] ; Exertional SOB [ ] ; Orthopnea [ ] ; Pedal Edema [ ] ; Palpitations [ ] ; Syncope [ ] ; Presyncope [ ] ; Paroxysmal nocturnal dyspnea[ ]   Pulmonary: Cough [ ] ; Wheezing[ ] ; Hemoptysis[ ] ; Sputum [ ] ; Snoring [ ]   GI: Vomiting[ ] ; Dysphagia[ ] ; Melena[ ] ; Hematochezia [ ] ; Heartburn[ ] ; Abdominal pain [ ] ; Constipation [ ] ; Diarrhea [ ] ; BRBPR [ ]   GU: Hematuria[ ] ; Dysuria [ ] ; Nocturia[ ]   Vascular: Pain in legs with walking [ ] ; Pain in feet with lying flat [ ] ; Non-healing sores [ ] ; Stroke [ ] ; TIA [ ] ; Slurred speech [ ] ;  Neuro: Headaches[ ] ; Vertigo[ ] ; Seizures[ ] ; Paresthesias[ ] ;Blurred vision [ ] ; Diplopia [ ] ; Vision changes [ ]   Ortho/Skin: Arthritis [ Y]; Joint pain [ ] ; Muscle pain [ ] ; Joint swelling [ ] ; Back Pain [ ] ; Rash [ ]   Psych: Depression[ ] ; Anxiety[ ]   Heme: Bleeding problems [ ] ; Clotting disorders [ ] ; Anemia [ ]   Endocrine: Diabetes [ ] ; Thyroid  dysfunction[ ]   Home Medications Prior to Admission medications   Medication Sig Start Date End Date Taking? Authorizing Provider  aspirin 81 MG tablet Take 81 mg by mouth daily.   Yes Historical Provider, MD  calcium citrate-vitamin D (CALCIUM CITRATE +) 315-200 MG-UNIT per tablet Take 1 tablet by mouth 2 (two) times daily.   Yes Historical Provider, MD  ipratropium (ATROVENT) 0.03 % nasal spray Place 2 sprays into both nostrils at bedtime as needed for rhinitis.    Yes Historical Provider, MD  isosorbide mononitrate (IMDUR) 60 MG 24 hr tablet Take 1 tablet (60 mg total) by mouth daily. 09/19/15  Yes [name], MD  ketotifen (THERA TEARS ALLERGY) 0.025 % ophthalmic solution Place 1 drop into both eyes as needed (for dry eyes).   Yes Historical Provider, MD  LORazepam (ATIVAN) 0.5 MG tablet Take 0.5 mg by mouth at bedtime.    Yes Historical Provider, MD  MELATONIN PO Take 1 tablet by mouth at bedtime.    Yes Historical Provider, MD  Multiple Vitamins-Minerals (CENTRUM SILVER ULTRA WOMENS PO) Take 1 tablet by mouth daily.   Yes Historical Provider, MD  NIFEdipine (PROCARDIA XL/ADALAT-CC) 90 MG 24 hr tablet Take 1 tablet (90 mg total) by mouth 2 (two) times daily. 09/19/15  Yes [name], MD  potassium chloride SA (K-DUR,KLOR-CON)  20 MEQ tablet TAKE 1 TABLET TWICE DAILY. 06/25/15  Yes Larey Dresser, MD  pravastatin (PRAVACHOL) 40 MG tablet TAKE 1 TABLET IN THE P.M. 02/27/15  Yes Jolaine Artist, MD  furosemide (LASIX) 80 MG tablet Take 1 tablet (80 mg total) by mouth 2 (two) times daily. 10/07/15   Ripudeep Krystal Eaton, MD  loratadine (CLARITIN) 10 MG tablet Take 1 tablet (10 mg total) by mouth daily as needed for allergies or rhinitis. 10/07/15   Ripudeep Krystal Eaton, MD  metolazone (ZAROXOLYN) 5 MG tablet Take 1 tablet (5 mg total) by mouth 2 (two) times daily. 10/07/15   Ripudeep Krystal Eaton, MD  NIFEdipine (PROCARDIA-XL/ADALAT CC) 60 MG 24 hr tablet Take 1 tablet (60 mg total) by mouth 2 (two)  times daily. 10/07/15   Ripudeep Krystal Eaton, MD  sodium chloride (OCEAN) 0.65 % SOLN nasal spray Place 1 spray into both nostrils as needed for congestion. 10/07/15   Ripudeep Krystal Eaton, MD    Past Medical History: Past Medical History  Diagnosis Date  . Allergic rhinitis   . Hypertension   . Fibrocystic breast disease   . GERD (gastroesophageal reflux disease)   . Aortic stenosis, severe 08/2009    s/p AVR w pericardial tissue valve 10/2009  . Osteoporosis 12/2010    Reclast given  . Multinodular goiter   . Coronary artery disease 10/2009    S/P SVG to OM   . CRD (chronic renal disease), stage III   . Positive for microalbuminuria     On tevetan  . Benign familial tremor   . Atelectasis     scarring at basis on CXR  . Chronic diastolic (congestive) heart failure (HCC)     a. EF of 55-60% in 08/2015    Past Surgical History: Past Surgical History  Procedure Laterality Date  . Aortic valve replacement  2011  . Coronary artery bypass graft  2011    SVG-OM    Family History: Family History  Problem Relation Age of Onset  . Coronary artery disease Mother   . Coronary artery disease Father   . Hyperlipidemia Daughter     Social History: Social History   Social History  . Marital Status: Widowed    Spouse Name: N/A  . Number of Children: N/A  . Years of Education: N/A   Social History Main Topics  . Smoking status: Never Smoker   . Smokeless tobacco: None  . Alcohol Use: No  . Drug Use: No  . Sexual Activity: Not Asked   Other Topics Concern  . None   Social History Narrative    Allergies:  Allergies  Allergen Reactions  . Aceon [Perindopril] Other (See Comments)    Chest Pain  . Beta Adrenergic Blockers Other (See Comments)    Serious cough both times she tried.  Marland Kitchen Hydralazine Hcl Cough  . Actonel [Risedronate Sodium] Other (See Comments)    Unknown  . Allegra [Fexofenadine] Other (See Comments)    BACK PAIN  . Biaxin [Clarithromycin] Nausea Only  .  Ciprofloxacin Nausea Only  . Crestor [Rosuvastatin] Itching  . Fosamax [Alendronate Sodium] Other (See Comments)    Muscle pain  . Lipitor [Atorvastatin] Itching  . Promethazine Hcl Other (See Comments)    Unknown  . Simvastatin Rash  . Sulfa Antibiotics Other (See Comments)    GI Upset  . Welchol [Colesevelam Hcl] Rash    Objective:    Vital Signs:   Temp:  [97.6 F (36.4 C)-98 F (36.7  C)] 98 F (36.7 C) (01/09 0526) Pulse Rate:  [57-68] 57 (01/09 0526) Resp:  [18-20] 20 (01/09 0526) BP: (151-168)/(54-69) 151/69 mmHg (01/09 0526) SpO2:  [93 %-98 %] 93 % (01/09 0526) Weight:  [111 lb 4.8 oz (50.485 kg)] 111 lb 4.8 oz (50.485 kg) (01/09 0526) Last BM Date: 10/06/15  Weight change: Filed Weights   10/05/15 0334 10/06/15 0331 10/07/15 0526  Weight: 113 lb 3.2 oz (51.347 kg) 114 lb 6.4 oz (51.891 kg) 111 lb 4.8 oz (50.485 kg)    Intake/Output:   Intake/Output Summary (Last 24 hours) at 10/07/15 1042 Last data filed at 10/07/15 0853  Gross per 24 hour  Intake    640 ml  Output   2776 ml  Net  -2136 ml     Physical Exam: General:  Elderly. No resp difficulty. Sitting in chair.  HEENT: normal Neck: supple. JVP 6-7. Carotids 2+ bilat; + bruits. No lymphadenopathy or thryomegaly appreciated. Cor: PMI nondisplaced. Regular rate & rhythm. 2/6 SEM at  RUSB Lungs: clear Abdomen: soft, nontender, nondistended. No hepatosplenomegaly. No bruits or masses. Good bowel sounds. Extremities: no cyanosis, clubbing, rash, edema Neuro: alert & orientedx3, cranial nerves grossly intact. moves all 4 extremities w/o difficulty. Affect pleasant  Telemetry: 1st degree HB 70   Labs: Basic Metabolic Panel:  Recent Labs Lab 10/03/15 0010 10/03/15 0125 10/04/15 0130 10/05/15 1130 10/06/15 0311 10/07/15 0510  NA  --  136 136 129* 128* 129*  K  --  4.0 3.0* 4.2 3.3* 3.1*  CL  --  101 94* 89* 86* 85*  CO2  --  24 27 30 30 30   GLUCOSE  --  120* 87 124* 102* 109*  BUN  --  31* 41*  43* 56* 57*  CREATININE  --  2.46* 2.40* 2.22* 2.25* 2.28*  CALCIUM  --  8.7* 8.6* 8.7* 9.1 8.8*  MG 2.2  --  2.0  --   --   --     Liver Function Tests:  Recent Labs Lab 10/02/15 1545 10/04/15 0130  AST 24 20  ALT 24 18  ALKPHOS 50 46  BILITOT 0.4 0.2*  PROT 6.7 6.0*  ALBUMIN 3.0* 2.7*   No results for input(s): LIPASE, AMYLASE in the last 168 hours. No results for input(s): AMMONIA in the last 168 hours.  CBC:  Recent Labs Lab 10/02/15 1545 10/04/15 0130  WBC 8.4 6.1  NEUTROABS  --  3.9  HGB 10.3* 10.9*  HCT 31.7* 32.0*  MCV 84.8 84.0  PLT 283 295    Cardiac Enzymes:  Recent Labs Lab 10/03/15 0125 10/03/15 0744 10/03/15 2039 10/04/15 0130 10/04/15 0849  TROPONINI 0.06* 0.06* 0.07* 0.09* 0.08*    BNP: BNP (last 3 results)  Recent Labs  09/11/15 1715 09/14/15 0506 10/02/15 1545  BNP 1681.0* 1257.4* 1383.6*    ProBNP (last 3 results) No results for input(s): PROBNP in the last 8760 hours.   CBG: No results for input(s): GLUCAP in the last 168 hours.  Coagulation Studies: No results for input(s): LABPROT, INR in the last 72 hours.  Other results: EKG: NSR 89. Anterior Qs. IVCD. No ST-T wave abnormalities.    Imaging:  No results found.   Medications:     Current Medications: . antiseptic oral rinse  7 mL Mouth Rinse q12n4p  . aspirin  81 mg Oral Daily  . calcium-vitamin D  1 tablet Oral BID WC  . chlorhexidine  15 mL Mouth Rinse BID  . furosemide  80 mg  Oral BID  . heparin  5,000 Units Subcutaneous 3 times per day  . isosorbide mononitrate  30 mg Oral Daily  . loratadine  10 mg Oral Daily  . LORazepam  0.5 mg Oral QHS  . metolazone  5 mg Oral BID  . NIFEdipine  60 mg Oral BID  . potassium chloride  40 mEq Oral Daily  . pravastatin  40 mg Oral q1800     Infusions:      Assessment:   1. A/C Diastolic Heart Failure  2. HTN 3. S/P Biopresthetic AVR 2011 4. CKD, 4 5. Hypokalemia   Plan/Discussion:   Victoria Banks is  a 82 year old admitted with a/c diastolic heart failure. She has been diuresed with IV lasix. Weight down 8 pounds. Now appears euvolemic. Start lasix 80 mg twice a day today. Renal function ok. Need potassium supplemented prior to d/c.   Should be ok to discharge this afternoon. Will schedule follow up with Dr Haroldine Laws.    Length of Stay: [place] NP-C  10/07/2015, 10:42 AM  Advanced Heart Failure Team Pager [phone] (M-F; 7a - 4p)  Please contact [facility] for night-coverage after hours (4p -7a ) and weekends on [email or web address]  Patient seen and examined with Darrick Grinder, NP. We discussed all aspects of the encounter. I agree with the assessment and plan as stated above.   Previous notes and echo images reviewed personally. She has normal LV function with grade 3 DD. Now readmitted with recurrent volume overload. Baseline weight now 111. Was 119 on admit. She says lasix doesn't work that well for her any more. Will continue lasix 80 bid and add metolazone 2.5 mg prn (with extra KCL 20) for weight of 115 or greater. Will need to watch renal function closely. Has f/u in HF Clinic next week.   Bensimhon, Daniel,MD 2:46 PM

## 2015-10-07 NOTE — Discharge Summary (Addendum)
Physician Discharge Summary   Patient ID: Victoria Banks MRN: [id number] DOB/AGE: 80/03/1921 80 y.o.  Admit date: 10/02/2015 Discharge date: 10/07/2015  Primary Care Physician:  Mathews Argyle, MD  Discharge Diagnoses:    . Acute on chronic diastolic CHF (congestive heart failure) ([place]) . Acute respiratory failure with hypoxia ([place]) . AKI (acute kidney injury) ([place]) . CKD (chronic kidney disease) stage 4, GFR 15-29 ml/min (HCC) . Edema extremities . Essential hypertension, benign . History of aortic valve replacement with bioprosthetic valve . Pulmonary hypertension ([place]) . Elevated troponin . Essential hypertension . Acute on chronic renal failure (HCC)  Consults: Heart failure team, Dr. Haroldine Laws  Recommendations for Outpatient Follow-up:  1. Home health PT, OT, O2 was arranged 2. Please repeat CBC/BMET at next visit   DIET: Heart healthy diet    Allergies:   Allergies  Allergen Reactions  . Aceon [Perindopril] Other (See Comments)    Chest Pain  . Beta Adrenergic Blockers Other (See Comments)    Serious cough both times she tried.  Marland Kitchen Hydralazine Hcl Cough  . Actonel [Risedronate Sodium] Other (See Comments)    Unknown  . Allegra [Fexofenadine] Other (See Comments)    BACK PAIN  . Biaxin [Clarithromycin] Nausea Only  . Ciprofloxacin Nausea Only  . Crestor [Rosuvastatin] Itching  . Fosamax [Alendronate Sodium] Other (See Comments)    Muscle pain  . Lipitor [Atorvastatin] Itching  . Promethazine Hcl Other (See Comments)    Unknown  . Simvastatin Rash  . Sulfa Antibiotics Other (See Comments)    GI Upset  . Welchol [Colesevelam Hcl] Rash     DISCHARGE MEDICATIONS: Current Discharge Medication List    START taking these medications   Details  loratadine (CLARITIN) 10 MG tablet Take 1 tablet (10 mg total) by mouth daily as needed for allergies or rhinitis. Qty: 30 tablet, Refills: 3    metolazone (ZAROXOLYN) 2.5 MG tablet Take 1 tablet (2.5 mg  total) by mouth as needed (For weight greater than 115 pounds or greater. Take with an extra 20 meq of potassium). Qty: 30 tablet, Refills: 3    sodium chloride (OCEAN) 0.65 % SOLN nasal spray Place 1 spray into both nostrils as needed for congestion. Qty: 1 Bottle, Refills: 3      CONTINUE these medications which have CHANGED   Details  furosemide (LASIX) 80 MG tablet Take 1 tablet (80 mg total) by mouth 2 (two) times daily. Qty: 60 tablet, Refills: 3    NIFEdipine (PROCARDIA-XL/ADALAT CC) 60 MG 24 hr tablet Take 1 tablet (60 mg total) by mouth 2 (two) times daily. Qty: 60 tablet, Refills: 3      CONTINUE these medications which have NOT CHANGED   Details  aspirin 81 MG tablet Take 81 mg by mouth daily.    calcium citrate-vitamin D (CALCIUM CITRATE +) 315-200 MG-UNIT per tablet Take 1 tablet by mouth 2 (two) times daily.    ipratropium (ATROVENT) 0.03 % nasal spray Place 2 sprays into both nostrils at bedtime as needed for rhinitis.     isosorbide mononitrate (IMDUR) 60 MG 24 hr tablet Take 1 tablet (60 mg total) by mouth daily. Qty: 30 tablet, Refills: 0    ketotifen (THERA TEARS ALLERGY) 0.025 % ophthalmic solution Place 1 drop into both eyes as needed (for dry eyes).    LORazepam (ATIVAN) 0.5 MG tablet Take 0.5 mg by mouth at bedtime.     MELATONIN PO Take 1 tablet by mouth at bedtime.     Multiple  Vitamins-Minerals (CENTRUM SILVER ULTRA WOMENS PO) Take 1 tablet by mouth daily.    potassium chloride SA (K-DUR,KLOR-CON) 20 MEQ tablet TAKE 1 TABLET TWICE DAILY. Qty: 60 tablet, Refills: 0    pravastatin (PRAVACHOL) 40 MG tablet TAKE 1 TABLET IN THE P.M. Qty: 30 tablet, Refills: 6         Brief H and P: For complete details please refer to admission H and P, but in brief 82 yo F Hx CAD S/P CABG, Aortic Ds S/P Bioprosthetic valve replacement, Chronic Diastolic CHF, HTN, and CKD stage III who presented from her ALF with dyspnea and hypoxia, worsening over a week. She had  been hospitalized 2 weeks prior with an acute exacerbation of chronic diastolic CHF. She was continuing to take Lasix 80 mg twice daily, but progressive dyspnea and lower extremity edema developed.  In ED patient was saturating in the 70s on room air, recovering to the 90s on nonrebreather. Despite lasix there was minimal urine output. Patient was placed on BiPAP and admitted to stepdown unit.   Hospital Course:  Principal problem Acute hypoxic respiratory failure likely due to Acute exacerbation of chronic diastolic CHF  - Patient was admitted with hypoxia and needed to be placed on BiPAP at the time of admission. Patient does not use oxygen at home. She was placed on IV Lasix with metolazone for diuresis. Patient had 7.19 L negative balance and weight down from 119-> 111 lbs on discharge. - Home O2 evaluation was done, patient qualifies for 2 L home O2 on ambulation. - Patient was transitioned to oral Lasix 80 mg twice a day with Metolazone as needed with instructions per CHF team. Heart failure team was also consulted. - 2-D echo in 12/16 had shown EF of 55-60% with no regional wall motion abnormalities. - Patient has an appointment with Dr. Haroldine Laws for further management outpatient.  Acute on chronic renal failure stage III-IV (Baseline Cr is 1.5) - Cr on admission 2.4, ACE inhibitor discontinued, creatinine stable and improving  Pulmonary hypertension -2-D echo 12/15 showed EF of 55-60%, PA pressure 43  Bioprosthetic AoV -valve function normal per TTE 09/12/2015  Hypokalemia  -due to diuresis, replaced  Elevated troponin -likely demand ischemia - troponin peaked at 0.09  HTN - currently BP stable   Day of Discharge BP 155/59 mmHg  Pulse 67  Temp(Src) 97.6 F (36.4 C) (Oral)  Resp 18  Ht 5\' 1"  (1.549 m)  Wt 50.485 kg (111 lb 4.8 oz)  BMI 21.04 kg/m2  SpO2 93%  Physical Exam: General: Alert and awake oriented x3 not in any acute distress. HEENT: anicteric  sclera, pupils reactive to light and accommodation CVS: S1-S2 clear no murmur rubs or gallops Chest: clear to auscultation bilaterally, no wheezing rales or rhonchi Abdomen: soft nontender, nondistended, normal bowel sounds Extremities: no cyanosis, clubbing or edema noted bilaterally Neuro: Cranial nerves II-XII intact, no focal neurological deficits   The results of significant diagnostics from this hospitalization (including imaging, microbiology, ancillary and laboratory) are listed below for reference.    LAB RESULTS: Basic Metabolic Panel:  Recent Labs Lab 10/04/15 0130  10/06/15 0311 10/07/15 0510  NA 136  < > 128* 129*  K 3.0*  < > 3.3* 3.1*  CL 94*  < > 86* 85*  CO2 27  < > 30 30  GLUCOSE 87  < > 102* 109*  BUN 41*  < > 56* 57*  CREATININE 2.40*  < > 2.25* 2.28*  CALCIUM 8.6*  < >  9.1 8.8*  MG 2.0  --   --   --   < > = values in this interval not displayed. Liver Function Tests:  Recent Labs Lab 10/02/15 1545 10/04/15 0130  AST 24 20  ALT 24 18  ALKPHOS 50 46  BILITOT 0.4 0.2*  PROT 6.7 6.0*  ALBUMIN 3.0* 2.7*   No results for input(s): LIPASE, AMYLASE in the last 168 hours. No results for input(s): AMMONIA in the last 168 hours. CBC:  Recent Labs Lab 10/02/15 1545 10/04/15 0130  WBC 8.4 6.1  NEUTROABS  --  3.9  HGB 10.3* 10.9*  HCT 31.7* 32.0*  MCV 84.8 84.0  PLT 283 295   Cardiac Enzymes:  Recent Labs Lab 10/04/15 0130 10/04/15 0849  TROPONINI 0.09* 0.08*   BNP: Invalid input(s): POCBNP CBG: No results for input(s): GLUCAP in the last 168 hours.  Significant Diagnostic Studies:  Dg Chest Portable 1 View  10/02/2015  CLINICAL DATA:  Respiratory distress/ congestive heart failure. Shortness of breath. EXAM: PORTABLE CHEST 1 VIEW COMPARISON:  September 11, 2015 FINDINGS: There is generalized interstitial edema with airspace consolidation in the bases. There are bilateral pleural effusions with cardiomegaly and pulmonary venous hypertension.  Patient is status post coronary artery bypass grafting and aortic valve replacement. There is atherosclerotic change in the aorta. Bones appear osteoporotic. There is chronic acromioclavicular separation on the right with evidence of old trauma involving the right lateral clavicle. IMPRESSION: Congestive heart failure. Cannot exclude superimposed pneumonia in the bases. The appearance is similar to most recent prior study. Electronically Signed   By: Lowella Grip III M.D.   On: 10/02/2015 15:25    2D ECHO:   Disposition and Follow-up: Discharge Instructions    ([facility]) Call MD:  Anytime you have any of the following symptoms: 1) 3 pound weight gain in 24 hours or 5 pounds in 1 week 2) shortness of breath, with or without a dry hacking cough 3) swelling in the hands, feet or stomach 4) if you have to sleep on extra pillows at night in order to breathe.    Complete by:  As directed      Diet - low sodium heart healthy    Complete by:  As directed      Discharge instructions    Complete by:  As directed   Fluid restriction 1500cc/24hours     Increase activity slowly    Complete by:  As directed             DISPOSITION: Home with home health   DISCHARGE FOLLOW-UP Follow-up Information    Follow up with Mathews Argyle, MD On 10/11/2015.   Specialty:  Internal Medicine   Why:  for hospital follow-up, please keep the appt    Contact information:   [address] [place] [address] [phone]       Follow up with Glori Bickers, MD On 10/15/2015.   Specialty:  Cardiology   Why:  at 10:30 Garage Code 1000   Contact information:   [address] [place] [address] [phone]        Time spent on Discharge:  35 mins    Signed:   Shakera Ebrahimi M.D. [facility] 10/07/2015, 2:58 PM Pager: [phone]

## 2015-10-07 NOTE — Care Management Note (Signed)
Case Management Note  Patient Details  Name: Victoria Banks MRN: [id number] Date of Birth: 09/24/1921  Subjective/Objective:    Pt is from (I) apt @ [facility], active with [facility] for RN, PT, OT services.  Will resume home health services as well as discharge with home O2.                           Expected Discharge Plan:  [place]  In-House Referral:     Discharge planning Services  CM Consult  Post Acute Care Choice:  Resumption of Svcs/PTA Provider  DME Arranged:  Oxygen DME Agency:  [facility] Arranged:  RN, PT, OT, Disease Management [place] Agency:  [facility]  Status of Service:  Completed, signed off  Medicare Important Message Given:  Yes   Girard Cooter, RN 10/07/2015, 1:26 PM

## 2015-10-07 NOTE — Progress Notes (Signed)
Physical Therapy Treatment Patient Details Name: NATURELLE NEVIUS MRN: [id number] DOB: March 03, 1921 Today's Date: 10/07/2015    History of Present Illness 82 yo female with recent admission to hospital returns from independent living at [facility] with shortness of breath "i was retaining fluid""  I couldn't  catch my breath " She did not use oxygen at home.     PT Comments    Patient improving with tolerance to ambulation.  Dropped sats yesterday to 86% ambulating on RA, maintained on 1L O2 this session with mild dyspnea.  Will need home O2.  Still feels weak compared to her baseline so HHPT recommended.  Will likely d/c home today per patient.  Will check back if not d/c.  Follow Up Recommendations  Home health PT     Equipment Recommendations  None recommended by PT    Recommendations for Other Services       Precautions / Restrictions Precautions Precautions: Fall Restrictions Weight Bearing Restrictions: No    Mobility  Bed Mobility               General bed mobility comments: pt sitting on edge of bed   Transfers Overall transfer level: Needs assistance Equipment used: Rolling walker (2 wheeled) Transfers: Sit to/from Stand Sit to Stand: Supervision         General transfer comment: increased assist up from low toilet pulling up on grabbar, assist for safety with increased attempts needed to rise from low seat  Ambulation/Gait Ambulation/Gait assistance: Supervision Ambulation Distance (Feet): 150 Feet (and 100') Assistive device: Rolling walker (2 wheeled) Gait Pattern/deviations: Step-through pattern;Decreased stride length;Shuffle     General Gait Details: slower initially with walker than was shaky, then imiproved once replaced with more sturdy walker, educated on PLB and on modifying activity for energy conservation   Stairs            Wheelchair Mobility    Modified Rankin (Stroke Patients Only)       Balance Overall balance  assessment: Needs assistance         Standing balance support: No upper extremity supported Standing balance-Leahy Scale: Fair Standing balance comment: washing hands at sink, wide BOS, no LOB, when goes to move needs UE support                    Cognition Arousal/Alertness: Awake/alert Behavior During Therapy: WFL for tasks assessed/performed Overall Cognitive Status: Within Functional Limits for tasks assessed                      Exercises      General Comments General comments (skin integrity, edema, etc.): reports daughter hopefully can assist more with groceries, etc despite needing to help her spouse who had surgery over Christmas      Pertinent Vitals/Pain Pain Assessment: No/denies pain    Home Living                      Prior Function            PT Goals (current goals can now be found in the care plan section) Progress towards PT goals: Progressing toward goals    Frequency       PT Plan Current plan remains appropriate    Co-evaluation             End of Session Equipment Utilized During Treatment: Gait belt;Oxygen Activity Tolerance: Patient tolerated treatment well Patient left: in chair;with call bell/phone  within reach     Time: 0950-1018 PT Time Calculation (min) (ACUTE ONLY): 28 min  Charges:  $Gait Training: 23-37 mins                    G Codes:      Reginia Naas 10/30/2015, 10:26 AM  Magda Kiel, [place] Oct 30, 2015

## 2015-10-08 DIAGNOSIS — G250 Essential tremor: Secondary | ICD-10-CM | POA: Diagnosis not present

## 2015-10-08 DIAGNOSIS — I130 Hypertensive heart and chronic kidney disease with heart failure and stage 1 through stage 4 chronic kidney disease, or unspecified chronic kidney disease: Secondary | ICD-10-CM | POA: Diagnosis not present

## 2015-10-08 DIAGNOSIS — I5032 Chronic diastolic (congestive) heart failure: Secondary | ICD-10-CM | POA: Diagnosis not present

## 2015-10-08 DIAGNOSIS — N184 Chronic kidney disease, stage 4 (severe): Secondary | ICD-10-CM | POA: Diagnosis not present

## 2015-10-08 DIAGNOSIS — R262 Difficulty in walking, not elsewhere classified: Secondary | ICD-10-CM | POA: Diagnosis not present

## 2015-10-08 DIAGNOSIS — M6281 Muscle weakness (generalized): Secondary | ICD-10-CM | POA: Diagnosis not present

## 2015-10-09 DIAGNOSIS — G250 Essential tremor: Secondary | ICD-10-CM | POA: Diagnosis not present

## 2015-10-09 DIAGNOSIS — I5032 Chronic diastolic (congestive) heart failure: Secondary | ICD-10-CM | POA: Diagnosis not present

## 2015-10-09 DIAGNOSIS — I130 Hypertensive heart and chronic kidney disease with heart failure and stage 1 through stage 4 chronic kidney disease, or unspecified chronic kidney disease: Secondary | ICD-10-CM | POA: Diagnosis not present

## 2015-10-09 DIAGNOSIS — M6281 Muscle weakness (generalized): Secondary | ICD-10-CM | POA: Diagnosis not present

## 2015-10-09 DIAGNOSIS — N184 Chronic kidney disease, stage 4 (severe): Secondary | ICD-10-CM | POA: Diagnosis not present

## 2015-10-09 DIAGNOSIS — R262 Difficulty in walking, not elsewhere classified: Secondary | ICD-10-CM | POA: Diagnosis not present

## 2015-10-11 DIAGNOSIS — I5032 Chronic diastolic (congestive) heart failure: Secondary | ICD-10-CM | POA: Diagnosis not present

## 2015-10-11 DIAGNOSIS — M6281 Muscle weakness (generalized): Secondary | ICD-10-CM | POA: Diagnosis not present

## 2015-10-11 DIAGNOSIS — R262 Difficulty in walking, not elsewhere classified: Secondary | ICD-10-CM | POA: Diagnosis not present

## 2015-10-11 DIAGNOSIS — G250 Essential tremor: Secondary | ICD-10-CM | POA: Diagnosis not present

## 2015-10-11 DIAGNOSIS — N184 Chronic kidney disease, stage 4 (severe): Secondary | ICD-10-CM | POA: Diagnosis not present

## 2015-10-11 DIAGNOSIS — I130 Hypertensive heart and chronic kidney disease with heart failure and stage 1 through stage 4 chronic kidney disease, or unspecified chronic kidney disease: Secondary | ICD-10-CM | POA: Diagnosis not present

## 2015-10-14 DIAGNOSIS — R262 Difficulty in walking, not elsewhere classified: Secondary | ICD-10-CM | POA: Diagnosis not present

## 2015-10-14 DIAGNOSIS — N184 Chronic kidney disease, stage 4 (severe): Secondary | ICD-10-CM | POA: Diagnosis not present

## 2015-10-14 DIAGNOSIS — M6281 Muscle weakness (generalized): Secondary | ICD-10-CM | POA: Diagnosis not present

## 2015-10-14 DIAGNOSIS — I5032 Chronic diastolic (congestive) heart failure: Secondary | ICD-10-CM | POA: Diagnosis not present

## 2015-10-14 DIAGNOSIS — G250 Essential tremor: Secondary | ICD-10-CM | POA: Diagnosis not present

## 2015-10-14 DIAGNOSIS — I130 Hypertensive heart and chronic kidney disease with heart failure and stage 1 through stage 4 chronic kidney disease, or unspecified chronic kidney disease: Secondary | ICD-10-CM | POA: Diagnosis not present

## 2015-10-15 ENCOUNTER — Encounter (HOSPITAL_COMMUNITY): Payer: Self-pay | Admitting: Internal Medicine

## 2015-10-15 ENCOUNTER — Ambulatory Visit (HOSPITAL_COMMUNITY)
Admit: 2015-10-15 | Discharge: 2015-10-15 | Disposition: A | Discharge status: Home / Self Care | Payer: Medicare Other | Source: Ambulatory Visit | Attending: Internal Medicine | Admitting: Internal Medicine

## 2015-10-15 VITALS — BP 146/66 | HR 83 | Wt 110.5 lb

## 2015-10-15 DIAGNOSIS — I130 Hypertensive heart and chronic kidney disease with heart failure and stage 1 through stage 4 chronic kidney disease, or unspecified chronic kidney disease: Secondary | ICD-10-CM | POA: Insufficient documentation

## 2015-10-15 DIAGNOSIS — Z954 Presence of other heart-valve replacement: Secondary | ICD-10-CM

## 2015-10-15 DIAGNOSIS — I5032 Chronic diastolic (congestive) heart failure: Secondary | ICD-10-CM | POA: Diagnosis not present

## 2015-10-15 DIAGNOSIS — I10 Essential (primary) hypertension: Secondary | ICD-10-CM | POA: Diagnosis not present

## 2015-10-15 DIAGNOSIS — I2510 Atherosclerotic heart disease of native coronary artery without angina pectoris: Secondary | ICD-10-CM | POA: Insufficient documentation

## 2015-10-15 DIAGNOSIS — N184 Chronic kidney disease, stage 4 (severe): Secondary | ICD-10-CM | POA: Insufficient documentation

## 2015-10-15 DIAGNOSIS — R609 Edema, unspecified: Secondary | ICD-10-CM | POA: Diagnosis not present

## 2015-10-15 DIAGNOSIS — Z953 Presence of xenogenic heart valve: Secondary | ICD-10-CM

## 2015-10-15 DIAGNOSIS — Z951 Presence of aortocoronary bypass graft: Secondary | ICD-10-CM | POA: Diagnosis not present

## 2015-10-15 DIAGNOSIS — R600 Localized edema: Secondary | ICD-10-CM

## 2015-10-15 LAB — BASIC METABOLIC PANEL
Anion gap: 12 (ref 5–15)
BUN: 50 mg/dL — AB (ref 6–20)
CHLORIDE: 97 mmol/L — AB (ref 101–111)
CO2: 27 mmol/L (ref 22–32)
Calcium: 9.4 mg/dL (ref 8.9–10.3)
Creatinine, Ser: 2.29 mg/dL — ABNORMAL HIGH (ref 0.44–1.00)
GFR calc Af Amer: 20 mL/min — ABNORMAL LOW (ref >60)
GFR calc non Af Amer: 17 mL/min — ABNORMAL LOW (ref >60)
GLUCOSE: 135 mg/dL — AB (ref 65–99)
POTASSIUM: 3.9 mmol/L (ref 3.5–5.1)
SODIUM: 136 mmol/L (ref 135–145)

## 2015-10-15 MED ORDER — DOXAZOSIN MESYLATE 1 MG PO TABS: 1.0000 mg | ORAL_TABLET | ORAL | Status: DC | PRN

## 2015-10-15 NOTE — Patient Instructions (Signed)
Stop Isosorbide  Take Doxazosin 1 mg as needed for SBP 170 or greater  Labs today  Your physician recommends that you schedule a follow-up appointment in: 1 month

## 2015-10-15 NOTE — Progress Notes (Signed)
ADVANCED HEART FAILURE [facility]  Patient ID: Victoria Banks, female   DOB: 1920-10-16, 82 y.o.   MRN: [id number] PCP: Dr. Felipa Banks  82 yo with history of bioprosthetic AVR, CAD, CKD IV and chronic diastolic.    Admitted to [facility] last week with acute on chronic diastolic CHF with hypoxia and needed to be placed on BiPAP at the time of admission. Diuresed from 119-> 111 lbs on discharge. Home O2 evaluation was done, patient qualifies for 2 L home O2 on ambulation. Patient was transitioned to oral Lasix 80 mg twice a day with Metolazone as needed. Creatinine was 2.4 on admission and 2.28 on d/c on 10/07/15 - 2-D echo in 12/16 had shown EF of 55-60% with no regional wall motion abnormalities.  She returns for follow up with her daughter. Overall feeling good. Denies SOB/Orthopnea. Able to do all ADLs. Has been wearing pulse ox and O2 staying 94-95% without O2. Doing home PT. SBP at home 140s-170s still (she checks it daily). Taking all her BP meds.  Weight stable 108-111. Has not needed metolazone.  Labs (10/15): K 4.4, creatinine 1.9, HCT 33.8, LDL 145 Labs (11/15): K 4.4, creatinine 1.47, BNP 1863 Labs (2/16) K 4.4, creatinine 1.65 Labs (02/22/15) K 4.2, creatinine 1.81 (by Dr Victoria Banks her PCP) Labs (6/16): K 4.3, creatinine 1.86  PMH: 1. HTN: Clonidine caused a dry mouth. BB caused serious cough 2. Hyperlipidemia 3. GERD 4. Aortic stenosis: s/p bioprosthetic aortic valve replacement in 2/11.  5. CAD: s/p SVG-OM in 2/11 with AVR.  6. Tremor 7. Multinodular goiter 8. Chronic diastolic CHF: Echo (A999333) with EF 55-60%, mild LVH, moderate diastolic dysfunction, normal bioprosthetic aortic valve, mild MR, PA systolic pressure 37 mmHg.   SH: Nonsmoker, lives at [facility] independent living, daughter is in town.   FH: No premature CAD.  ROS: All systems reviewed and negative except as per HPI.   Current Outpatient Prescriptions  Medication Sig Dispense Refill  . aspirin  81 MG tablet Take 81 mg by mouth daily.    . calcium citrate-vitamin D (CALCIUM CITRATE +) 315-200 MG-UNIT per tablet Take 1 tablet by mouth 2 (two) times daily.    . furosemide (LASIX) 80 MG tablet Take 1 tablet (80 mg total) by mouth 2 (two) times daily. 60 tablet 3  . isosorbide mononitrate (IMDUR) 60 MG 24 hr tablet Take 1 tablet (60 mg total) by mouth daily. 30 tablet 0  . ketotifen (THERA TEARS ALLERGY) 0.025 % ophthalmic solution Place 1 drop into both eyes as needed (for dry eyes).    Marland Kitchen loratadine (CLARITIN) 10 MG tablet Take 1 tablet (10 mg total) by mouth daily as needed for allergies or rhinitis. 30 tablet 3  . LORazepam (ATIVAN) 0.5 MG tablet Take 0.5 mg by mouth at bedtime.     . Multiple Vitamins-Minerals (CENTRUM SILVER ULTRA WOMENS PO) Take 1 tablet by mouth daily.    Marland Kitchen NIFEdipine (PROCARDIA XL/ADALAT-CC) 90 MG 24 hr tablet Take 90 mg by mouth daily.    . potassium chloride SA (K-DUR,KLOR-CON) 20 MEQ tablet TAKE 1 TABLET TWICE DAILY. 60 tablet 0  . pravastatin (PRAVACHOL) 40 MG tablet TAKE 1 TABLET IN THE P.M. 30 tablet 6  . metolazone (ZAROXOLYN) 2.5 MG tablet Take 1 tablet (2.5 mg total) by mouth as needed (For weight greater than 115 pounds or greater. Take with an extra 20 meq of potassium). (Patient not taking: Reported on 10/15/2015) 30 tablet 3   No current facility-administered medications for  this encounter.   BP 146/66 mmHg  Pulse 83  Wt 110 lb 8 oz (50.122 kg)  SpO2 96% General: NAD Neck: JVD not elevated,No thyromegaly or thyroid nodule.  Lungs: Occasional crackles at bases bilaterally.  CV: Nondisplaced PMI.  Heart regular S1/S2, no S3/S4, 2/6 early SEM RUSB that radiates to carotids.  Trace ankle edema. Normal pedal pulses.  Abdomen: Soft, nontender, no hepatosplenomegaly, no distention.  Skin: Intact without lesions or rashes.  Neurologic: Alert and oriented x 3.  Psych: Normal affect. Extremities: No clubbing or cyanosis.  HEENT: Normal.    Assessment/Plan: 1. Chronic diastolic CHF:  Volume status stable, NYHA class II.  She looks euvolemic on exam and weight is stable, continue current Lasix. BMET/BNP today.  - Long discussion about sliding scale to protect weight 109-111 2. HTN: BP remains slightly high at home. Given her age don't want to be too aggressive. Goal SBP for now 140-160.  - Continue Nifedipine 90 bid - Has been on hydralazine in past without much effect.  - She has been unable to tolerate beta blockers and clonidine.  - Will give script for doxazosin 1mg  prn for BP a70 or greater only - Can stop Imdur 3. CKD stage IV:  BMET today.   4. Bioprosthetic aortic valve: Valve looked ok on 10/15 echo. 5. CAD: s/p CABG with SVG-OM at time of AVR.  She is on ASA 81 and statin. No chest pain. Lipids followed by Dr. Felipa Banks.   Victoria Bickers MD 10/15/2015

## 2015-10-17 ENCOUNTER — Other Ambulatory Visit (HOSPITAL_COMMUNITY): Payer: Self-pay | Admitting: Internal Medicine

## 2015-10-17 DIAGNOSIS — R262 Difficulty in walking, not elsewhere classified: Secondary | ICD-10-CM | POA: Diagnosis not present

## 2015-10-17 DIAGNOSIS — G250 Essential tremor: Secondary | ICD-10-CM | POA: Diagnosis not present

## 2015-10-17 DIAGNOSIS — M6281 Muscle weakness (generalized): Secondary | ICD-10-CM | POA: Diagnosis not present

## 2015-10-17 DIAGNOSIS — I5032 Chronic diastolic (congestive) heart failure: Secondary | ICD-10-CM | POA: Diagnosis not present

## 2015-10-17 DIAGNOSIS — I130 Hypertensive heart and chronic kidney disease with heart failure and stage 1 through stage 4 chronic kidney disease, or unspecified chronic kidney disease: Secondary | ICD-10-CM | POA: Diagnosis not present

## 2015-10-17 DIAGNOSIS — N184 Chronic kidney disease, stage 4 (severe): Secondary | ICD-10-CM | POA: Diagnosis not present

## 2015-10-21 DIAGNOSIS — M6281 Muscle weakness (generalized): Secondary | ICD-10-CM | POA: Diagnosis not present

## 2015-10-21 DIAGNOSIS — G250 Essential tremor: Secondary | ICD-10-CM | POA: Diagnosis not present

## 2015-10-21 DIAGNOSIS — I5032 Chronic diastolic (congestive) heart failure: Secondary | ICD-10-CM | POA: Diagnosis not present

## 2015-10-21 DIAGNOSIS — R262 Difficulty in walking, not elsewhere classified: Secondary | ICD-10-CM | POA: Diagnosis not present

## 2015-10-21 DIAGNOSIS — I130 Hypertensive heart and chronic kidney disease with heart failure and stage 1 through stage 4 chronic kidney disease, or unspecified chronic kidney disease: Secondary | ICD-10-CM | POA: Diagnosis not present

## 2015-10-21 DIAGNOSIS — N184 Chronic kidney disease, stage 4 (severe): Secondary | ICD-10-CM | POA: Diagnosis not present

## 2015-10-22 ENCOUNTER — Encounter (HOSPITAL_COMMUNITY): Payer: Medicare Other | Admitting: Internal Medicine

## 2015-10-22 DIAGNOSIS — I5032 Chronic diastolic (congestive) heart failure: Secondary | ICD-10-CM | POA: Diagnosis not present

## 2015-10-22 DIAGNOSIS — R262 Difficulty in walking, not elsewhere classified: Secondary | ICD-10-CM | POA: Diagnosis not present

## 2015-10-22 DIAGNOSIS — M6281 Muscle weakness (generalized): Secondary | ICD-10-CM | POA: Diagnosis not present

## 2015-10-22 DIAGNOSIS — G250 Essential tremor: Secondary | ICD-10-CM | POA: Diagnosis not present

## 2015-10-22 DIAGNOSIS — N184 Chronic kidney disease, stage 4 (severe): Secondary | ICD-10-CM | POA: Diagnosis not present

## 2015-10-22 DIAGNOSIS — I130 Hypertensive heart and chronic kidney disease with heart failure and stage 1 through stage 4 chronic kidney disease, or unspecified chronic kidney disease: Secondary | ICD-10-CM | POA: Diagnosis not present

## 2015-10-23 DIAGNOSIS — I129 Hypertensive chronic kidney disease with stage 1 through stage 4 chronic kidney disease, or unspecified chronic kidney disease: Secondary | ICD-10-CM | POA: Diagnosis not present

## 2015-10-23 DIAGNOSIS — I5030 Unspecified diastolic (congestive) heart failure: Secondary | ICD-10-CM | POA: Diagnosis not present

## 2015-10-24 DIAGNOSIS — G250 Essential tremor: Secondary | ICD-10-CM | POA: Diagnosis not present

## 2015-10-24 DIAGNOSIS — M6281 Muscle weakness (generalized): Secondary | ICD-10-CM | POA: Diagnosis not present

## 2015-10-24 DIAGNOSIS — R262 Difficulty in walking, not elsewhere classified: Secondary | ICD-10-CM | POA: Diagnosis not present

## 2015-10-24 DIAGNOSIS — N184 Chronic kidney disease, stage 4 (severe): Secondary | ICD-10-CM | POA: Diagnosis not present

## 2015-10-24 DIAGNOSIS — I5032 Chronic diastolic (congestive) heart failure: Secondary | ICD-10-CM | POA: Diagnosis not present

## 2015-10-24 DIAGNOSIS — I130 Hypertensive heart and chronic kidney disease with heart failure and stage 1 through stage 4 chronic kidney disease, or unspecified chronic kidney disease: Secondary | ICD-10-CM | POA: Diagnosis not present

## 2015-10-29 DIAGNOSIS — N184 Chronic kidney disease, stage 4 (severe): Secondary | ICD-10-CM | POA: Diagnosis not present

## 2015-10-29 DIAGNOSIS — M6281 Muscle weakness (generalized): Secondary | ICD-10-CM | POA: Diagnosis not present

## 2015-10-29 DIAGNOSIS — I5032 Chronic diastolic (congestive) heart failure: Secondary | ICD-10-CM | POA: Diagnosis not present

## 2015-10-29 DIAGNOSIS — G250 Essential tremor: Secondary | ICD-10-CM | POA: Diagnosis not present

## 2015-10-29 DIAGNOSIS — I130 Hypertensive heart and chronic kidney disease with heart failure and stage 1 through stage 4 chronic kidney disease, or unspecified chronic kidney disease: Secondary | ICD-10-CM | POA: Diagnosis not present

## 2015-10-29 DIAGNOSIS — R262 Difficulty in walking, not elsewhere classified: Secondary | ICD-10-CM | POA: Diagnosis not present

## 2015-10-31 DIAGNOSIS — R262 Difficulty in walking, not elsewhere classified: Secondary | ICD-10-CM | POA: Diagnosis not present

## 2015-10-31 DIAGNOSIS — M6281 Muscle weakness (generalized): Secondary | ICD-10-CM | POA: Diagnosis not present

## 2015-10-31 DIAGNOSIS — I130 Hypertensive heart and chronic kidney disease with heart failure and stage 1 through stage 4 chronic kidney disease, or unspecified chronic kidney disease: Secondary | ICD-10-CM | POA: Diagnosis not present

## 2015-10-31 DIAGNOSIS — N184 Chronic kidney disease, stage 4 (severe): Secondary | ICD-10-CM | POA: Diagnosis not present

## 2015-10-31 DIAGNOSIS — I5032 Chronic diastolic (congestive) heart failure: Secondary | ICD-10-CM | POA: Diagnosis not present

## 2015-10-31 DIAGNOSIS — G250 Essential tremor: Secondary | ICD-10-CM | POA: Diagnosis not present

## 2015-11-04 DIAGNOSIS — R262 Difficulty in walking, not elsewhere classified: Secondary | ICD-10-CM | POA: Diagnosis not present

## 2015-11-04 DIAGNOSIS — I130 Hypertensive heart and chronic kidney disease with heart failure and stage 1 through stage 4 chronic kidney disease, or unspecified chronic kidney disease: Secondary | ICD-10-CM | POA: Diagnosis not present

## 2015-11-04 DIAGNOSIS — N184 Chronic kidney disease, stage 4 (severe): Secondary | ICD-10-CM | POA: Diagnosis not present

## 2015-11-04 DIAGNOSIS — M6281 Muscle weakness (generalized): Secondary | ICD-10-CM | POA: Diagnosis not present

## 2015-11-04 DIAGNOSIS — G250 Essential tremor: Secondary | ICD-10-CM | POA: Diagnosis not present

## 2015-11-04 DIAGNOSIS — I5032 Chronic diastolic (congestive) heart failure: Secondary | ICD-10-CM | POA: Diagnosis not present

## 2015-11-11 DIAGNOSIS — I5032 Chronic diastolic (congestive) heart failure: Secondary | ICD-10-CM | POA: Diagnosis not present

## 2015-11-11 DIAGNOSIS — R262 Difficulty in walking, not elsewhere classified: Secondary | ICD-10-CM | POA: Diagnosis not present

## 2015-11-11 DIAGNOSIS — G250 Essential tremor: Secondary | ICD-10-CM | POA: Diagnosis not present

## 2015-11-11 DIAGNOSIS — I130 Hypertensive heart and chronic kidney disease with heart failure and stage 1 through stage 4 chronic kidney disease, or unspecified chronic kidney disease: Secondary | ICD-10-CM | POA: Diagnosis not present

## 2015-11-11 DIAGNOSIS — N184 Chronic kidney disease, stage 4 (severe): Secondary | ICD-10-CM | POA: Diagnosis not present

## 2015-11-11 DIAGNOSIS — M6281 Muscle weakness (generalized): Secondary | ICD-10-CM | POA: Diagnosis not present

## 2015-11-14 ENCOUNTER — Ambulatory Visit (HOSPITAL_COMMUNITY)
Admission: RE | Admit: 2015-11-14 | Discharge: 2015-11-14 | Disposition: A | Discharge status: Home / Self Care | Payer: Medicare Other | Source: Ambulatory Visit | Attending: Internal Medicine | Admitting: Internal Medicine

## 2015-11-14 ENCOUNTER — Encounter (HOSPITAL_COMMUNITY): Payer: Self-pay | Admitting: Internal Medicine

## 2015-11-14 VITALS — BP 146/68 | HR 74 | Wt 112.8 lb

## 2015-11-14 DIAGNOSIS — Z953 Presence of xenogenic heart valve: Secondary | ICD-10-CM | POA: Diagnosis not present

## 2015-11-14 DIAGNOSIS — K219 Gastro-esophageal reflux disease without esophagitis: Secondary | ICD-10-CM | POA: Diagnosis not present

## 2015-11-14 DIAGNOSIS — I2510 Atherosclerotic heart disease of native coronary artery without angina pectoris: Secondary | ICD-10-CM | POA: Diagnosis not present

## 2015-11-14 DIAGNOSIS — I130 Hypertensive heart and chronic kidney disease with heart failure and stage 1 through stage 4 chronic kidney disease, or unspecified chronic kidney disease: Secondary | ICD-10-CM | POA: Diagnosis not present

## 2015-11-14 DIAGNOSIS — Z7982 Long term (current) use of aspirin: Secondary | ICD-10-CM | POA: Diagnosis not present

## 2015-11-14 DIAGNOSIS — I10 Essential (primary) hypertension: Secondary | ICD-10-CM | POA: Diagnosis not present

## 2015-11-14 DIAGNOSIS — N184 Chronic kidney disease, stage 4 (severe): Secondary | ICD-10-CM | POA: Diagnosis not present

## 2015-11-14 DIAGNOSIS — Z951 Presence of aortocoronary bypass graft: Secondary | ICD-10-CM | POA: Diagnosis not present

## 2015-11-14 DIAGNOSIS — E785 Hyperlipidemia, unspecified: Secondary | ICD-10-CM | POA: Diagnosis not present

## 2015-11-14 DIAGNOSIS — I5032 Chronic diastolic (congestive) heart failure: Secondary | ICD-10-CM | POA: Diagnosis not present

## 2015-11-14 DIAGNOSIS — Z79899 Other long term (current) drug therapy: Secondary | ICD-10-CM | POA: Diagnosis not present

## 2015-11-14 LAB — BASIC METABOLIC PANEL
ANION GAP: 16 — AB (ref 5–15)
BUN: 44 mg/dL — AB (ref 6–20)
CALCIUM: 9.8 mg/dL (ref 8.9–10.3)
CO2: 28 mmol/L (ref 22–32)
CREATININE: 2.07 mg/dL — AB (ref 0.44–1.00)
Chloride: 94 mmol/L — ABNORMAL LOW (ref 101–111)
GFR calc Af Amer: 23 mL/min — ABNORMAL LOW (ref >60)
GFR, EST NON AFRICAN AMERICAN: 20 mL/min — AB (ref >60)
GLUCOSE: 110 mg/dL — AB (ref 65–99)
Potassium: 3.5 mmol/L (ref 3.5–5.1)
Sodium: 138 mmol/L (ref 135–145)

## 2015-11-14 MED ORDER — DOXAZOSIN MESYLATE 1 MG PO TABS: 1.0000 mg | ORAL_TABLET | Freq: Every day | ORAL | Status: DC

## 2015-11-14 NOTE — Addendum Note (Signed)
Encounter addended by: Harvie Junior, CMA on: 11/14/2015 11:30 AM<BR>     Documentation filed: Dx Association, Patient Instructions Section, Orders

## 2015-11-14 NOTE — Patient Instructions (Signed)
Routine lab work today. (bmet) Will notify you of abnormal results  Take Doxazosin 1mg  (1 tablet) daily. You may take an additional tablet for systolic blood pressure greater than 170.  Follow up with Dr.Bensimhon in 2 months.

## 2015-11-14 NOTE — Progress Notes (Signed)
Patient ID: Victoria Banks, female   DOB: 28-May-1921, 82 y.o.   MRN: [id number]  ADVANCED HEART FAILURE [facility]  Patient ID: Victoria Banks, female   DOB: 1921-05-06, 82 y.o.   MRN: [id number] PCP: Dr. Felipa Eth  82 yo with history of bioprosthetic AVR, CAD, CKD IV and chronic diastolic.    Admitted to [facility] last week with acute on chronic diastolic CHF with hypoxia and needed to be placed on BiPAP at the time of admission. Diuresed from 119-> 111 lbs on discharge. Home O2 evaluation was done, patient qualifies for 2 L home O2 on ambulation. Patient was transitioned to oral Lasix 80 mg twice a day with Metolazone as needed. Creatinine was 2.4 on admission and 2.28 on d/c on 10/07/15 - 2-D echo in 12/16 had shown EF of 55-60% with no regional wall motion abnormalities.  She returns for follow up with her daughter. Overall feeling good. Denies SOB/Orthopnea. Able to do all ADLs. Has finished home PT and goes to [facility] to ride bicycle for 10 mins and Nu-step 10 mins. SBP at home 160s-180s still (she checks it daily).Takes doxazosin 1 mg about 3 times per week when SBP 180 greater. Brings BP down to 150-160 range Taking all her BP meds.  Weight stable 108-110. Has not needed metolazone.  Labs (10/15): K 4.4, creatinine 1.9, HCT 33.8, LDL 145 Labs (11/15): K 4.4, creatinine 1.47, BNP 1863 Labs (2/16) K 4.4, creatinine 1.65 Labs (02/22/15) K 4.2, creatinine 1.81 (by Dr Felipa Eth her PCP) Labs (6/16): K 4.3, creatinine 1.86 Labs (1/17): K 43.9, creatinine 2.29  PMH: 1. HTN: Clonidine caused a dry mouth. BB caused serious cough 2. Hyperlipidemia 3. GERD 4. Aortic stenosis: s/p bioprosthetic aortic valve replacement in 2/11.  5. CAD: s/p SVG-OM in 2/11 with AVR.  6. Tremor 7. Multinodular goiter 8. Chronic diastolic CHF: Echo (A999333) with EF 55-60%, mild LVH, moderate diastolic dysfunction, normal bioprosthetic aortic valve, mild MR, PA systolic pressure 37 mmHg.   SH:  Nonsmoker, lives at [facility] independent living, daughter is in town.   FH: No premature CAD.  ROS: All systems reviewed and negative except as per HPI.   Current Outpatient Prescriptions  Medication Sig Dispense Refill  . aspirin 81 MG tablet Take 81 mg by mouth daily.    . calcium citrate-vitamin D (CALCIUM CITRATE +) 315-200 MG-UNIT per tablet Take 1 tablet by mouth 2 (two) times daily.    Marland Kitchen doxazosin (CARDURA) 1 MG tablet Take 1 tablet (1 mg total) by mouth as needed (for SBP 170 or greater). 30 tablet 3  . furosemide (LASIX) 80 MG tablet Take 1 tablet (80 mg total) by mouth 2 (two) times daily. 60 tablet 3  . ketotifen (THERA TEARS ALLERGY) 0.025 % ophthalmic solution Place 1 drop into both eyes as needed (for dry eyes).    . LORazepam (ATIVAN) 0.5 MG tablet Take 0.5 mg by mouth at bedtime.     . metolazone (ZAROXOLYN) 5 MG tablet Take 5 mg by mouth 2 (two) times daily.    . Multiple Vitamins-Minerals (CENTRUM SILVER ULTRA WOMENS PO) Take 1 tablet by mouth daily.    Marland Kitchen NIFEdipine (PROCARDIA XL/ADALAT-CC) 90 MG 24 hr tablet Take 90 mg by mouth 2 (two) times daily.    . potassium chloride SA (K-DUR,KLOR-CON) 20 MEQ tablet TAKE 1 TABLET TWICE DAILY. 60 tablet 0  . pravastatin (PRAVACHOL) 40 MG tablet TAKE 1 TABLET IN THE P.M. 30 tablet 0   No current  facility-administered medications for this encounter.   BP 146/68 mmHg  Pulse 74  Wt 112 lb 12 oz (51.143 kg)  SpO2 97% General: NAD Neck: JVD not elevated,No thyromegaly or thyroid nodule.  Lungs: Clear dull at left base   CV: Nondisplaced PMI.  Heart regular S1/S2, no S3/S4, 2/6 early SEM RUSB that radiates to carotids.  Trace ankle edema. Normal pedal pulses.  Abdomen: Soft, nontender, no hepatosplenomegaly, no distention.  Skin: Intact without lesions or rashes.  Neurologic: Alert and oriented x 3.  Psych: Normal affect. Extremities: No clubbing or cyanosis.  HEENT: Normal.   Assessment/Plan: 1. Chronic diastolic CHF:     - Doing so great. Volume status stable, NYHA class II.  She looks euvolemic on exam and weight is stable, continue current Lasix. BMET/BNP today.  - Reminded about sliding scale to protect weight 109-110 2. HTN: BP remains slightly high at home. Given her age don't want to be too aggressive. Goal SBP for now 140-160.  - Continue Nifedipine 90 bid - Has been on hydralazine in past without much effect.  - She has been unable to tolerate beta blockers and clonidine.  - Will change doxazosin to 1mg  daily and not just prn. Can still take an extra 1 mg as needed goal SBP 140-160 3. CKD stage IV:  BMET today.   4. Bioprosthetic aortic valve: Valve looked ok on 10/15 echo. 5. CAD: s/p CABG with SVG-OM at time of AVR.  She is on ASA 81 and statin. No chest pain. Lipids followed by Dr. Felipa Eth.   Glori Bickers MD 11/14/2015

## 2015-11-19 DIAGNOSIS — I5032 Chronic diastolic (congestive) heart failure: Secondary | ICD-10-CM | POA: Diagnosis not present

## 2015-11-19 DIAGNOSIS — N184 Chronic kidney disease, stage 4 (severe): Secondary | ICD-10-CM | POA: Diagnosis not present

## 2015-11-19 DIAGNOSIS — G250 Essential tremor: Secondary | ICD-10-CM | POA: Diagnosis not present

## 2015-11-19 DIAGNOSIS — I130 Hypertensive heart and chronic kidney disease with heart failure and stage 1 through stage 4 chronic kidney disease, or unspecified chronic kidney disease: Secondary | ICD-10-CM | POA: Diagnosis not present

## 2015-11-19 DIAGNOSIS — R262 Difficulty in walking, not elsewhere classified: Secondary | ICD-10-CM | POA: Diagnosis not present

## 2015-11-19 DIAGNOSIS — M6281 Muscle weakness (generalized): Secondary | ICD-10-CM | POA: Diagnosis not present

## 2015-11-21 ENCOUNTER — Other Ambulatory Visit (HOSPITAL_COMMUNITY): Payer: Self-pay | Admitting: Internal Medicine

## 2015-11-26 DIAGNOSIS — H353212 Exudative age-related macular degeneration, right eye, with inactive choroidal neovascularization: Secondary | ICD-10-CM | POA: Diagnosis not present

## 2015-11-26 DIAGNOSIS — H35372 Puckering of macula, left eye: Secondary | ICD-10-CM | POA: Diagnosis not present

## 2015-11-26 DIAGNOSIS — H353122 Nonexudative age-related macular degeneration, left eye, intermediate dry stage: Secondary | ICD-10-CM | POA: Diagnosis not present

## 2015-12-18 ENCOUNTER — Other Ambulatory Visit (HOSPITAL_COMMUNITY): Payer: Self-pay | Admitting: Internal Medicine

## 2015-12-25 ENCOUNTER — Other Ambulatory Visit: Payer: Self-pay | Admitting: *Deleted

## 2015-12-25 NOTE — Patient Outreach (Signed)
[facility] Care Management  12/25/2015  CHARNISSA SULAK 13-Mar-1921 [id number]  Referral from Dec 2016 Heart Failure readmission list:   Telephone call to patient; left HIPPA compliant voice mail requesting call back.  Plan:  Will follow up. Sherrin Daisy, RN BSN [place] Management Coordinator [facility] Care Management  [phone]

## 2015-12-27 ENCOUNTER — Ambulatory Visit: Payer: Self-pay | Admitting: *Deleted

## 2015-12-31 ENCOUNTER — Other Ambulatory Visit: Payer: Self-pay | Admitting: *Deleted

## 2015-12-31 ENCOUNTER — Encounter: Payer: Self-pay | Admitting: *Deleted

## 2015-12-31 NOTE — Patient Outreach (Signed)
[place] [facility]) Care Management  12/31/2015  Victoria Banks 21-Jan-1921 [id number]   Telephone call to patient who was advised of reason for call  & of [facility] care management services.    Patient reluctant to respond to screening questions and did not answer many of questions.   States she is doing fine and did not have any health concerns or questions and that she would contact her doctor if needed. .   States daughter takes her to MD appointments and that she is seeing primary care doctor every 3 months and sees specialist as scheduled. States she manages own medication and has no problems obtaining medications.   Patient declined case  management services.   Plan: Send MD closure letter. Send [facility]  Brochure, magnet and Know Before You Go Brochure to patient.  Sherrin Daisy, RN BSN CCM Care Management Coordinator [facility] Care Management  [phone]  Send to care management assistant to close case.

## 2016-01-06 ENCOUNTER — Other Ambulatory Visit (HOSPITAL_COMMUNITY): Payer: Self-pay | Admitting: Internal Medicine

## 2016-01-07 NOTE — Progress Notes (Signed)
Erroneous

## 2016-01-08 ENCOUNTER — Other Ambulatory Visit (HOSPITAL_COMMUNITY): Payer: Self-pay | Admitting: *Deleted

## 2016-01-08 MED ORDER — DOXAZOSIN MESYLATE 1 MG PO TABS: 1.0000 mg | ORAL_TABLET | Freq: Every day | ORAL | Status: DC

## 2016-01-15 ENCOUNTER — Ambulatory Visit (HOSPITAL_COMMUNITY)
Admission: RE | Admit: 2016-01-15 | Discharge: 2016-01-15 | Disposition: A | Discharge status: Home / Self Care | Payer: Medicare Other | Source: Ambulatory Visit | Attending: Internal Medicine | Admitting: Internal Medicine

## 2016-01-15 VITALS — BP 164/40 | HR 72 | Wt 114.0 lb

## 2016-01-15 DIAGNOSIS — I359 Nonrheumatic aortic valve disorder, unspecified: Secondary | ICD-10-CM | POA: Diagnosis not present

## 2016-01-15 DIAGNOSIS — Z951 Presence of aortocoronary bypass graft: Secondary | ICD-10-CM | POA: Insufficient documentation

## 2016-01-15 DIAGNOSIS — E785 Hyperlipidemia, unspecified: Secondary | ICD-10-CM | POA: Diagnosis not present

## 2016-01-15 DIAGNOSIS — N184 Chronic kidney disease, stage 4 (severe): Secondary | ICD-10-CM | POA: Diagnosis not present

## 2016-01-15 DIAGNOSIS — Z7982 Long term (current) use of aspirin: Secondary | ICD-10-CM | POA: Diagnosis not present

## 2016-01-15 DIAGNOSIS — I5032 Chronic diastolic (congestive) heart failure: Secondary | ICD-10-CM | POA: Diagnosis not present

## 2016-01-15 DIAGNOSIS — I130 Hypertensive heart and chronic kidney disease with heart failure and stage 1 through stage 4 chronic kidney disease, or unspecified chronic kidney disease: Secondary | ICD-10-CM | POA: Diagnosis not present

## 2016-01-15 DIAGNOSIS — K219 Gastro-esophageal reflux disease without esophagitis: Secondary | ICD-10-CM | POA: Insufficient documentation

## 2016-01-15 DIAGNOSIS — I10 Essential (primary) hypertension: Secondary | ICD-10-CM

## 2016-01-15 DIAGNOSIS — Z79899 Other long term (current) drug therapy: Secondary | ICD-10-CM | POA: Insufficient documentation

## 2016-01-15 DIAGNOSIS — Z953 Presence of xenogenic heart valve: Secondary | ICD-10-CM | POA: Diagnosis not present

## 2016-01-15 DIAGNOSIS — I2510 Atherosclerotic heart disease of native coronary artery without angina pectoris: Secondary | ICD-10-CM | POA: Diagnosis not present

## 2016-01-15 MED ORDER — DOXAZOSIN MESYLATE 2 MG PO TABS: 2.0000 mg | ORAL_TABLET | Freq: Two times a day (BID) | ORAL | Status: DC

## 2016-01-15 NOTE — Progress Notes (Signed)
ADVANCED HEART FAILURE [facility]  Patient ID: Victoria Banks, female   DOB: 1921-05-27, 82 y.o.   MRN: [id number] PCP: Dr. Felipa Banks  82 yo with history of bioprosthetic AVR, CAD, CKD IV and chronic diastolic.    Admitted to [facility] last week with acute on chronic diastolic CHF with hypoxia and needed to be placed on BiPAP at the time of admission. Diuresed from 119-> 111 lbs on discharge. Home O2 evaluation was done, patient qualifies for 2 L home O2 on ambulation. Patient was transitioned to oral Lasix 80 mg twice a day with Metolazone as needed. Creatinine was 2.4 on admission and 2.28 on d/c on 10/07/15 - 2-D echo in 12/16 had shown EF of 55-60% with no regional wall motion abnormalities.  She returns for follow up with her daughter. Overall feeling good. Denies SOB/Orthopnea. Able to do all ADLs. Goes to [facility] to ride bicycle for 10 mins and Nu-step 10 mins. SBP at home 160s-180s still (she checks it daily).Takes doxazosin 2 mg in the morning and 1 mg at night. Makes her sleepy at times  Weight stable 109-111. Has not needed metolazone.  Labs (10/15): K 4.4, creatinine 1.9, HCT 33.8, LDL 145 Labs (11/15): K 4.4, creatinine 1.47, BNP 1863 Labs (2/16) K 4.4, creatinine 1.65 Labs (02/22/15) K 4.2, creatinine 1.81 (by Dr Victoria Banks her PCP) Labs (6/16): K 4.3, creatinine 1.86 Labs (1/17): K 43.9, creatinine 2.29  PMH: 1. HTN: Clonidine caused a dry mouth. BB caused serious cough 2. Hyperlipidemia 3. GERD 4. Aortic stenosis: s/p bioprosthetic aortic valve replacement in 2/11.  5. CAD: s/p SVG-OM in 2/11 with AVR.  6. Tremor 7. Multinodular goiter 8. Chronic diastolic CHF: Echo (A999333) with EF 55-60%, mild LVH, moderate diastolic dysfunction, normal bioprosthetic aortic valve, mild MR, PA systolic pressure 37 mmHg.   SH: Nonsmoker, lives at [facility] independent living, daughter is in town.   FH: No premature CAD.  ROS: All systems reviewed and negative except as  per HPI.   Current Outpatient Prescriptions  Medication Sig Dispense Refill  . aspirin 81 MG tablet Take 81 mg by mouth daily.    . calcium citrate-vitamin D (CALCIUM CITRATE +) 315-200 MG-UNIT per tablet Take 1 tablet by mouth 2 (two) times daily.    Marland Kitchen doxazosin (CARDURA) 1 MG tablet Take 1-2 mg by mouth 2 (two) times daily. Take 2 mg in the morning and 1 mg in the evening. May take an additional tablet as needed for systolic blood pressure greater then 170.    . furosemide (LASIX) 80 MG tablet Take 1 tablet (80 mg total) by mouth 2 (two) times daily. 60 tablet 3  . ketotifen (THERA TEARS ALLERGY) 0.025 % ophthalmic solution Place 1 drop into both eyes as needed (for dry eyes).    . LORazepam (ATIVAN) 0.5 MG tablet Take 0.5 mg by mouth at bedtime.     . magnesium hydroxide (MILK OF MAGNESIA) 400 MG/5ML suspension Take 15 mLs by mouth at bedtime.    . metolazone (ZAROXOLYN) 5 MG tablet Take 5 mg by mouth 2 (two) times daily.    . Multiple Vitamins-Minerals (CENTRUM SILVER ULTRA WOMENS PO) Take 1 tablet by mouth daily.    Marland Kitchen NIFEdipine (PROCARDIA XL/ADALAT-CC) 90 MG 24 hr tablet Take 90 mg by mouth 2 (two) times daily.    . potassium chloride SA (K-DUR,KLOR-CON) 20 MEQ tablet TAKE 1 TABLET TWICE DAILY. 60 tablet 0  . pravastatin (PRAVACHOL) 40 MG tablet TAKE 1 TABLET ONCE  DAILY IN THE EVENING. 30 tablet 3   No current facility-administered medications for this encounter.   BP 164/40 mmHg  Pulse 72  Wt 114 lb (51.71 kg)  SpO2 95% General: NAD Neck: JVD not elevated,No thyromegaly or thyroid nodule.  Lungs: Clear minimal crackles at left base   CV: Nondisplaced PMI.  Heart regular S1/S2, no S3/S4, 2/6 early SEM RUSB that radiates to carotids.  Trace ankle edema. Normal pedal pulses.  Abdomen: Soft, nontender, no hepatosplenomegaly, no distention.  Skin: Intact without lesions or rashes.  Neurologic: Alert and oriented x 3.  Psych: Normal affect. Extremities: No clubbing or cyanosis.    HEENT: Normal.   Assessment/Plan: 1. Chronic diastolic CHF:   - Doing so great. Volume status stable, NYHA class II.  She looks euvolemic on exam and weight is stable, continue current Lasix.  - Reminded about sliding scale to protect weight 109-110 2. HTN: BP remains slightly high at home. Given her age don't want to be too aggressive. Goal SBP for now 140-160.  - Continue Nifedipine 90 bid - Has been on hydralazine in past without much effect.  - She has been unable to tolerate beta blockers and clonidine.  - Will change doxazosin to 2 mg bid Can still take an extra 1 mg at night as needed goal SBP 140-160 3. CKD stage IV:  BMET stable in 2/17.   4. Bioprosthetic aortic valve: Valve looked ok on 10/15 echo. 5. CAD: s/p CABG with SVG-OM at time of AVR.  She is on ASA 81 and statin. No chest pain. Lipids followed by Dr. Felipa Banks.   Victoria Bickers MD 01/15/2016

## 2016-01-15 NOTE — Addendum Note (Signed)
Encounter addended by: Effie Berkshire, RN on: 01/15/2016  2:20 PM<BR>     Documentation filed: Patient Instructions Section, Orders

## 2016-01-15 NOTE — Patient Instructions (Signed)
INCREASE Cardura to 2 mg twice daily.  Follow up with Dr. Haroldine Laws 4 months.  Do the following things EVERYDAY: 1) Weigh yourself in the morning before breakfast. Write it down and keep it in a log. 2) Take your medicines as prescribed 3) Eat low salt foods-Limit salt (sodium) to 2000 mg per day.  4) Stay as active as you can everyday 5) Limit all fluids for the day to less than 2 liters

## 2016-01-15 NOTE — Progress Notes (Addendum)
Advanced Heart Failure Medication Review by a Pharmacist  Does the patient  feel that his/her medications are working for him/her?  yes  Has the patient been experiencing any side effects to the medications prescribed?  no  Does the patient measure his/her own blood pressure or blood glucose at home?  yes - SBP 140-170s  Does the patient have any problems obtaining medications due to transportation or finances?   no  Understanding of regimen: good Understanding of indications: good Potential of compliance: good Patient understands to avoid NSAIDs. Patient understands to avoid decongestants.  Issues to address at subsequent visits: BP control   Pharmacist comments:  Victoria Banks is a pleasant 82 yo F presenting with her daughter and her medication bottles. She reports excellent compliance with her medications. She was prescribed doxazosin to be taken PRN for SBP >170 mmHg but admits to taking 2 mg in the am and 1 mg in pm scheduled. She did not have any specific medication-related questions or concerns for me at this time.   [name]. Velva Harman, PharmD, BCPS, CPP Clinical Pharmacist Pager: [phone] Phone: [phone] 01/15/2016 1:54 PM      Time with patient: 10 minutes Preparation and documentation time: 2 minutes Total time: 12 minutes

## 2016-01-21 DIAGNOSIS — I129 Hypertensive chronic kidney disease with stage 1 through stage 4 chronic kidney disease, or unspecified chronic kidney disease: Secondary | ICD-10-CM | POA: Diagnosis not present

## 2016-01-21 DIAGNOSIS — Z1389 Encounter for screening for other disorder: Secondary | ICD-10-CM | POA: Diagnosis not present

## 2016-01-21 DIAGNOSIS — N184 Chronic kidney disease, stage 4 (severe): Secondary | ICD-10-CM | POA: Diagnosis not present

## 2016-01-30 ENCOUNTER — Other Ambulatory Visit (HOSPITAL_COMMUNITY): Payer: Self-pay | Admitting: *Deleted

## 2016-02-17 ENCOUNTER — Other Ambulatory Visit (HOSPITAL_COMMUNITY): Payer: Self-pay | Admitting: Internal Medicine

## 2016-02-25 DIAGNOSIS — H35372 Puckering of macula, left eye: Secondary | ICD-10-CM | POA: Diagnosis not present

## 2016-02-25 DIAGNOSIS — H353211 Exudative age-related macular degeneration, right eye, with active choroidal neovascularization: Secondary | ICD-10-CM | POA: Diagnosis not present

## 2016-04-03 DIAGNOSIS — H353211 Exudative age-related macular degeneration, right eye, with active choroidal neovascularization: Secondary | ICD-10-CM | POA: Diagnosis not present

## 2016-05-08 DIAGNOSIS — H35372 Puckering of macula, left eye: Secondary | ICD-10-CM | POA: Diagnosis not present

## 2016-05-08 DIAGNOSIS — H353211 Exudative age-related macular degeneration, right eye, with active choroidal neovascularization: Secondary | ICD-10-CM | POA: Diagnosis not present

## 2016-05-15 DIAGNOSIS — H35372 Puckering of macula, left eye: Secondary | ICD-10-CM | POA: Diagnosis not present

## 2016-05-15 DIAGNOSIS — H353212 Exudative age-related macular degeneration, right eye, with inactive choroidal neovascularization: Secondary | ICD-10-CM | POA: Diagnosis not present

## 2016-05-15 DIAGNOSIS — Z961 Presence of intraocular lens: Secondary | ICD-10-CM | POA: Diagnosis not present

## 2016-05-15 DIAGNOSIS — H4720 Unspecified optic atrophy: Secondary | ICD-10-CM | POA: Diagnosis not present

## 2016-05-19 ENCOUNTER — Encounter (HOSPITAL_COMMUNITY): Payer: Self-pay | Admitting: Internal Medicine

## 2016-05-19 ENCOUNTER — Other Ambulatory Visit: Payer: Self-pay | Admitting: Psychiatry

## 2016-05-19 ENCOUNTER — Ambulatory Visit (HOSPITAL_COMMUNITY)
Admission: RE | Admit: 2016-05-19 | Discharge: 2016-05-19 | Disposition: A | Discharge status: Home / Self Care | Payer: Medicare Other | Source: Ambulatory Visit | Attending: Internal Medicine | Admitting: Internal Medicine

## 2016-05-19 ENCOUNTER — Other Ambulatory Visit (HOSPITAL_COMMUNITY): Payer: Self-pay | Admitting: Psychiatry

## 2016-05-19 VITALS — BP 152/52 | HR 65 | Wt 115.5 lb

## 2016-05-19 DIAGNOSIS — I2510 Atherosclerotic heart disease of native coronary artery without angina pectoris: Secondary | ICD-10-CM | POA: Insufficient documentation

## 2016-05-19 DIAGNOSIS — Z79899 Other long term (current) drug therapy: Secondary | ICD-10-CM | POA: Insufficient documentation

## 2016-05-19 DIAGNOSIS — Z951 Presence of aortocoronary bypass graft: Secondary | ICD-10-CM | POA: Diagnosis not present

## 2016-05-19 DIAGNOSIS — Z954 Presence of other heart-valve replacement: Secondary | ICD-10-CM | POA: Diagnosis not present

## 2016-05-19 DIAGNOSIS — Z953 Presence of xenogenic heart valve: Secondary | ICD-10-CM | POA: Insufficient documentation

## 2016-05-19 DIAGNOSIS — Z952 Presence of prosthetic heart valve: Secondary | ICD-10-CM

## 2016-05-19 DIAGNOSIS — E785 Hyperlipidemia, unspecified: Secondary | ICD-10-CM | POA: Insufficient documentation

## 2016-05-19 DIAGNOSIS — I25810 Atherosclerosis of coronary artery bypass graft(s) without angina pectoris: Secondary | ICD-10-CM

## 2016-05-19 DIAGNOSIS — I10 Essential (primary) hypertension: Secondary | ICD-10-CM

## 2016-05-19 DIAGNOSIS — H547 Unspecified visual loss: Secondary | ICD-10-CM

## 2016-05-19 DIAGNOSIS — I130 Hypertensive heart and chronic kidney disease with heart failure and stage 1 through stage 4 chronic kidney disease, or unspecified chronic kidney disease: Secondary | ICD-10-CM | POA: Insufficient documentation

## 2016-05-19 DIAGNOSIS — Z7982 Long term (current) use of aspirin: Secondary | ICD-10-CM | POA: Insufficient documentation

## 2016-05-19 DIAGNOSIS — E7800 Pure hypercholesterolemia, unspecified: Secondary | ICD-10-CM

## 2016-05-19 DIAGNOSIS — N184 Chronic kidney disease, stage 4 (severe): Secondary | ICD-10-CM | POA: Insufficient documentation

## 2016-05-19 DIAGNOSIS — I5032 Chronic diastolic (congestive) heart failure: Secondary | ICD-10-CM | POA: Insufficient documentation

## 2016-05-19 DIAGNOSIS — K219 Gastro-esophageal reflux disease without esophagitis: Secondary | ICD-10-CM | POA: Insufficient documentation

## 2016-05-19 NOTE — Patient Instructions (Signed)
We will contact you in 6 months to schedule your next appointment and echocardiogram  

## 2016-05-19 NOTE — Progress Notes (Signed)
Advanced Heart Failure Clinic Note   Patient ID: Victoria Banks, female   DOB: March 04, 1921, 82 y.o.   MRN: [id number] PCP: Dr. Felipa Eth HF: Dr. Haroldine Laws   82 yo with history of bioprosthetic AVR, CAD, CKD IV and chronic diastolic.    Admitted to [facility] last week with acute on chronic diastolic CHF with hypoxia and needed to be placed on BiPAP at the time of admission. Diuresed from 119-> 111 lbs on discharge. Home O2 evaluation was done, patient qualifies for 2 L home O2 on ambulation. Patient was transitioned to oral Lasix 80 mg twice a day with Metolazone as needed. Creatinine was 2.4 on admission and 2.28 on d/c on 10/07/15 - 2-D echo in 12/16 had shown EF of 55-60% with no regional wall motion abnormalities.  She returns today for regular follow up. Has been feeling great.  Only complaint is vision problems with macular degeneration which she is getting shots for.  Going to [employer] 1-3 times a week and rides the bike and [employer] for 10 minutes a piece. SBP at home 140-170s at home.  Takes an extra doxazosin for pressures over 170.  Has to do only 1-2 times per month. Weight at home stable 111-112. Denies SOB, CP, lightheadedness, or dizziness.   Labs (10/15): K 4.4, creatinine 1.9, HCT 33.8, LDL 145 Labs (11/15): K 4.4, creatinine 1.47, BNP 1863 Labs (2/16) K 4.4, creatinine 1.65 Labs (02/22/15) K 4.2, creatinine 1.81 (by Dr Felipa Eth her PCP) Labs (6/16): K 4.3, creatinine 1.86 Labs (1/17): K 43.9, creatinine 2.29  PMH: 1. HTN: Clonidine caused a dry mouth. BB caused serious cough 2. Hyperlipidemia 3. GERD 4. Aortic stenosis: s/p bioprosthetic aortic valve replacement in 2/11.  5. CAD: s/p SVG-OM in 2/11 with AVR.  6. Tremor 7. Multinodular goiter 8. Chronic diastolic CHF: Echo (A999333) with EF 55-60%, mild LVH, moderate diastolic dysfunction, normal bioprosthetic aortic valve, mild MR, PA systolic pressure 37 mmHg.   SH: Nonsmoker, lives at [facility]  independent living, daughter is in town.   FH: No premature CAD.  ROS: All systems reviewed and negative except as per HPI.   Current Outpatient Prescriptions  Medication Sig Dispense Refill  . aspirin 81 MG tablet Take 81 mg by mouth daily.    Marland Kitchen doxazosin (CARDURA) 2 MG tablet Take 1 tablet (2 mg total) by mouth 2 (two) times daily. May take an additional tablet as needed for systolic blood pressure greater then 170. 60 tablet 6  . furosemide (LASIX) 80 MG tablet TAKE 1 TABLET TWICE DAILY. 60 tablet 3  . ketotifen (THERA TEARS ALLERGY) 0.025 % ophthalmic solution Place 1 drop into both eyes as needed (for dry eyes).    . LORazepam (ATIVAN) 0.5 MG tablet Take 0.5 mg by mouth at bedtime.     . magnesium hydroxide (MILK OF MAGNESIA) 400 MG/5ML suspension Take 15 mLs by mouth at bedtime.    . metolazone (ZAROXOLYN) 5 MG tablet TAKE 1 TABLET TWICE DAILY. 60 tablet 3  . Multiple Vitamins-Minerals (CENTRUM SILVER ULTRA WOMENS PO) Take 1 tablet by mouth daily.    Marland Kitchen NIFEdipine (PROCARDIA XL/ADALAT-CC) 90 MG 24 hr tablet Take 90 mg by mouth 2 (two) times daily.    . potassium chloride SA (K-DUR,KLOR-CON) 20 MEQ tablet TAKE 1 TABLET TWICE DAILY. 60 tablet 0  . pravastatin (PRAVACHOL) 40 MG tablet TAKE 1 TABLET ONCE DAILY IN THE EVENING. 30 tablet 3  . calcium citrate-vitamin D (CALCIUM CITRATE +) 315-200 MG-UNIT per  tablet Take 1 tablet by mouth 2 (two) times daily.     No current facility-administered medications for this encounter.    BP (!) 152/52   Pulse 65   Wt 115 lb 8 oz (52.4 kg)   SpO2 98%   BMI 21.82 kg/m    Wt Readings from Last 3 Encounters:  05/19/16 115 lb 8 oz (52.4 kg)  01/15/16 114 lb (51.7 kg)  11/14/15 112 lb 12 oz (51.1 kg)    General: NAD HEENT: Normal.  Neck: JVD not elevated. No thyromegaly or lymphadenopathy appreciated. Lungs: CTAB, normal effort CV: Nondisplaced PMI.  Heart regular S1/S2, no S3/S4, 2/6 early SEM RUSB with mild radiation to carotids.  Trace  ankle edema. Normal pedal pulses.  Abdomen: Soft, NT, ND, no HSM. No bruits or masses. +BS  Skin: Intact without lesions or rashes.  Neurologic: Alert and oriented x 3.  Psych: Normal affect. Extremities: No clubbing or cyanosis.    Assessment/Plan: 1. Chronic diastolic CHF:   -  Volume status stable on exam and NYHA class II.  - Weight mildly elevated from previous but does appear euvolemic.  Thinks her appetite has improved. Reminded that her goal weight is 109-110 lbs.   2. HTN:  - BP remains elevated at home but within established goal of 140-160. Takes an extra doxazosin when it exceeds these limites.  Given her age we will not push further than this.   - Continue Nifedipine 90 BID - Intolerant of BB and clonidine and Hydralazine has not been effective in the past.  - Continue doxazosin 2 mg BID. change doxazosin to 2 mg bid Can still take an extra 1 mg at night as needed goal SBP 140-160 3. CKD stage IV:  - Last BMET in 2/17 stable.    4. Bioprosthetic aortic valve:  - Stable by echo 06/2014. Mean gradient 16 mm Hg, Peak 32 mm Hg. Will plan on echo at next visit.     - She knows to take 4 amoxicillin for surgical prophylaxis with her valve. 5. CAD: s/p CABG with SVG-OM at time of AVR.   - She remains on ASA 81 and statin.  - No CP - Lipids per PCP.    She looks great. Continue current regimen as above.  Follow up 6 months with Echo.    Shirley Friar, PA-C 05/19/2016   Patient seen and examined with Oda Kilts, PA-C. We discussed all aspects of the encounter. I agree with the assessment and plan as stated above.   Doing very well. BP well controlled. No change to current regimen. Reinforced need for SBE prophylaxis. Repeat echo at next visit.   Kainoah Bartosiewicz,MD 12:16 AM

## 2016-05-25 ENCOUNTER — Other Ambulatory Visit (HOSPITAL_COMMUNITY): Payer: Self-pay | Admitting: Internal Medicine

## 2016-05-25 ENCOUNTER — Other Ambulatory Visit (HOSPITAL_COMMUNITY): Payer: Self-pay | Admitting: Psychiatry

## 2016-05-25 DIAGNOSIS — H547 Unspecified visual loss: Secondary | ICD-10-CM

## 2016-05-26 ENCOUNTER — Other Ambulatory Visit (HOSPITAL_COMMUNITY): Payer: Self-pay | Admitting: Psychiatry

## 2016-05-26 ENCOUNTER — Ambulatory Visit (HOSPITAL_COMMUNITY)
Admission: RE | Admit: 2016-05-26 | Discharge: 2016-05-26 | Disposition: A | Discharge status: Home / Self Care | Payer: Medicare Other | Source: Ambulatory Visit | Attending: Psychiatry | Admitting: Psychiatry

## 2016-05-26 DIAGNOSIS — H547 Unspecified visual loss: Secondary | ICD-10-CM | POA: Insufficient documentation

## 2016-05-26 DIAGNOSIS — H4720 Unspecified optic atrophy: Secondary | ICD-10-CM

## 2016-05-26 DIAGNOSIS — G319 Degenerative disease of nervous system, unspecified: Secondary | ICD-10-CM | POA: Diagnosis not present

## 2016-05-26 DIAGNOSIS — I639 Cerebral infarction, unspecified: Secondary | ICD-10-CM

## 2016-05-26 DIAGNOSIS — I6782 Cerebral ischemia: Secondary | ICD-10-CM | POA: Insufficient documentation

## 2016-05-26 LAB — POCT I-STAT CREATININE: CREATININE: 2.4 mg/dL — AB (ref 0.44–1.00)

## 2016-06-08 ENCOUNTER — Other Ambulatory Visit (HOSPITAL_COMMUNITY): Payer: Self-pay | Admitting: Internal Medicine

## 2016-06-09 DIAGNOSIS — Z23 Encounter for immunization: Secondary | ICD-10-CM | POA: Diagnosis not present

## 2016-06-15 ENCOUNTER — Other Ambulatory Visit (HOSPITAL_COMMUNITY): Payer: Self-pay | Admitting: Internal Medicine

## 2016-06-23 DIAGNOSIS — H35372 Puckering of macula, left eye: Secondary | ICD-10-CM | POA: Diagnosis not present

## 2016-06-23 DIAGNOSIS — H353212 Exudative age-related macular degeneration, right eye, with inactive choroidal neovascularization: Secondary | ICD-10-CM | POA: Diagnosis not present

## 2016-06-23 DIAGNOSIS — H4720 Unspecified optic atrophy: Secondary | ICD-10-CM | POA: Diagnosis not present

## 2016-06-23 DIAGNOSIS — Z961 Presence of intraocular lens: Secondary | ICD-10-CM | POA: Diagnosis not present

## 2016-07-01 ENCOUNTER — Other Ambulatory Visit (HOSPITAL_COMMUNITY): Payer: Self-pay | Admitting: Internal Medicine

## 2016-07-22 DIAGNOSIS — H353211 Exudative age-related macular degeneration, right eye, with active choroidal neovascularization: Secondary | ICD-10-CM | POA: Diagnosis not present

## 2016-07-22 DIAGNOSIS — E7800 Pure hypercholesterolemia, unspecified: Secondary | ICD-10-CM | POA: Diagnosis not present

## 2016-07-22 DIAGNOSIS — I5030 Unspecified diastolic (congestive) heart failure: Secondary | ICD-10-CM | POA: Diagnosis not present

## 2016-07-22 DIAGNOSIS — I350 Nonrheumatic aortic (valve) stenosis: Secondary | ICD-10-CM | POA: Diagnosis not present

## 2016-07-22 DIAGNOSIS — I129 Hypertensive chronic kidney disease with stage 1 through stage 4 chronic kidney disease, or unspecified chronic kidney disease: Secondary | ICD-10-CM | POA: Diagnosis not present

## 2016-07-22 DIAGNOSIS — N184 Chronic kidney disease, stage 4 (severe): Secondary | ICD-10-CM | POA: Diagnosis not present

## 2016-07-22 DIAGNOSIS — D649 Anemia, unspecified: Secondary | ICD-10-CM | POA: Diagnosis not present

## 2016-07-22 DIAGNOSIS — Z79899 Other long term (current) drug therapy: Secondary | ICD-10-CM | POA: Diagnosis not present

## 2016-08-04 ENCOUNTER — Other Ambulatory Visit (HOSPITAL_COMMUNITY): Payer: Self-pay | Admitting: Internal Medicine

## 2016-08-07 ENCOUNTER — Other Ambulatory Visit (HOSPITAL_COMMUNITY): Payer: Self-pay | Admitting: Internal Medicine

## 2016-08-12 ENCOUNTER — Other Ambulatory Visit (HOSPITAL_COMMUNITY): Payer: Self-pay | Admitting: Internal Medicine

## 2016-08-14 ENCOUNTER — Other Ambulatory Visit (HOSPITAL_COMMUNITY): Payer: Self-pay | Admitting: Internal Medicine

## 2016-08-17 DIAGNOSIS — N184 Chronic kidney disease, stage 4 (severe): Secondary | ICD-10-CM | POA: Diagnosis not present

## 2016-08-17 DIAGNOSIS — J301 Allergic rhinitis due to pollen: Secondary | ICD-10-CM | POA: Diagnosis not present

## 2016-08-17 DIAGNOSIS — I129 Hypertensive chronic kidney disease with stage 1 through stage 4 chronic kidney disease, or unspecified chronic kidney disease: Secondary | ICD-10-CM | POA: Diagnosis not present

## 2016-08-19 ENCOUNTER — Other Ambulatory Visit (HOSPITAL_COMMUNITY): Payer: Self-pay

## 2016-08-19 MED ORDER — DOXAZOSIN MESYLATE 2 MG PO TABS: ORAL_TABLET | ORAL | 3 refills | Status: DC

## 2016-09-29 ENCOUNTER — Other Ambulatory Visit (HOSPITAL_COMMUNITY): Payer: Self-pay | Admitting: Internal Medicine

## 2016-11-13 ENCOUNTER — Ambulatory Visit (HOSPITAL_COMMUNITY)
Admission: RE | Admit: 2016-11-13 | Discharge: 2016-11-13 | Disposition: A | Discharge status: Home / Self Care | Payer: Medicare Other | Source: Ambulatory Visit | Attending: Internal Medicine | Admitting: Internal Medicine

## 2016-11-13 ENCOUNTER — Telehealth (HOSPITAL_COMMUNITY): Payer: Self-pay

## 2016-11-13 VITALS — BP 160/52 | HR 82 | Wt 114.8 lb

## 2016-11-13 DIAGNOSIS — I5032 Chronic diastolic (congestive) heart failure: Secondary | ICD-10-CM | POA: Diagnosis not present

## 2016-11-13 DIAGNOSIS — I359 Nonrheumatic aortic valve disorder, unspecified: Secondary | ICD-10-CM | POA: Diagnosis not present

## 2016-11-13 DIAGNOSIS — N184 Chronic kidney disease, stage 4 (severe): Secondary | ICD-10-CM | POA: Insufficient documentation

## 2016-11-13 DIAGNOSIS — I5022 Chronic systolic (congestive) heart failure: Secondary | ICD-10-CM

## 2016-11-13 DIAGNOSIS — Z952 Presence of prosthetic heart valve: Secondary | ICD-10-CM

## 2016-11-13 DIAGNOSIS — Z5189 Encounter for other specified aftercare: Secondary | ICD-10-CM | POA: Insufficient documentation

## 2016-11-13 DIAGNOSIS — I5043 Acute on chronic combined systolic (congestive) and diastolic (congestive) heart failure: Secondary | ICD-10-CM

## 2016-11-13 DIAGNOSIS — Z79899 Other long term (current) drug therapy: Secondary | ICD-10-CM | POA: Diagnosis not present

## 2016-11-13 DIAGNOSIS — I129 Hypertensive chronic kidney disease with stage 1 through stage 4 chronic kidney disease, or unspecified chronic kidney disease: Secondary | ICD-10-CM | POA: Diagnosis not present

## 2016-11-13 LAB — BASIC METABOLIC PANEL
Anion gap: 11 (ref 5–15)
BUN: 50 mg/dL — AB (ref 6–20)
CHLORIDE: 98 mmol/L — AB (ref 101–111)
CO2: 28 mmol/L (ref 22–32)
Calcium: 10.6 mg/dL — ABNORMAL HIGH (ref 8.9–10.3)
Creatinine, Ser: 3.08 mg/dL — ABNORMAL HIGH (ref 0.44–1.00)
GFR calc Af Amer: 14 mL/min — ABNORMAL LOW (ref >60)
GFR, EST NON AFRICAN AMERICAN: 12 mL/min — AB (ref >60)
GLUCOSE: 154 mg/dL — AB (ref 65–99)
POTASSIUM: 3.9 mmol/L (ref 3.5–5.1)
SODIUM: 137 mmol/L (ref 135–145)

## 2016-11-13 MED ORDER — DOXAZOSIN MESYLATE 2 MG PO TABS: 3.0000 mg | ORAL_TABLET | Freq: Two times a day (BID) | ORAL | 3 refills | Status: DC

## 2016-11-13 MED ORDER — FUROSEMIDE 80 MG PO TABS: 80.0000 mg | ORAL_TABLET | Freq: Two times a day (BID) | ORAL | 6 refills | Status: DC

## 2016-11-13 NOTE — Progress Notes (Signed)
Advanced Heart Failure Medication Review by a Pharmacist  Does the patient  feel that his/her medications are working for him/her?  yes  Has the patient been experiencing any side effects to the medications prescribed?  no  Does the patient measure his/her own blood pressure or blood glucose at home?  yes   Does the patient have any problems obtaining medications due to transportation or finances?   no  Understanding of regimen: good Understanding of indications: good Potential of compliance: good Patient understands to avoid NSAIDs. Patient understands to avoid decongestants.  Issues to address at subsequent visits: None   Pharmacist comments:  Victoria Banks is a pleasant 83 yo F presenting with her daughter and without a medication list. She reports good compliance with her regimen but does state that her furosemide dose was recently reduced from 80 mg BID to 40 mg BID 2/2 an increase in SCr. I also noticed that she had metolazone 5 mg BID on her list which she does not remember taking. I called [employer] and verified that she picked up a 30 day supply on 11/05/16.   Victoria Banks, PharmD, BCPS, CPP Clinical Pharmacist Pager: [phone] Phone: [phone] 11/13/2016 3:06 PM      Time with patient: 10 minutes Preparation and documentation time: 10 minutes Total time: 20 minutes

## 2016-11-13 NOTE — Progress Notes (Signed)
Advanced Heart Failure Clinic Note   Patient ID: Victoria Banks, female   DOB: 1921/09/01, 83 y.o.   MRN: [id number] PCP: Dr. Felipa Eth HF: Dr. Haroldine Laws   83 yo with history of bioprosthetic AVR, CAD, CKD IV and chronic diastolic.    Admitted to [facility] last week with acute on chronic diastolic CHF with hypoxia and needed to be placed on BiPAP at the time of admission. Diuresed from 119-> 111 lbs on discharge. Home O2 evaluation was done, patient qualifies for 2 L home O2 on ambulation. Patient was transitioned to oral Lasix 80 mg twice a day with Metolazone as needed. Creatinine was 2.4 on admission and 2.28 on d/c on 10/07/15 - 2-D echo in 12/16 had shown EF of 55-60% with no regional wall motion abnormalities.  She returns today for regular follow up. Feeling ok. Remains weak. A few months ago Dr. Felipa Eth cut lasix back to 40 bid and told her to take extra as needed.She occasionally does take extra every couple weeks. There is some question whether or not she got a prescription for metolazone 5mg  bid by accident. She is not sure if she is taking. SBP at home typically around 160. Dr. Felipa Eth also increased doxasozin to 2mg  bid. Has not gone back to [facility] since she had the flu. Denies SOB, CP, lightheadedness, or dizziness.   Labs (10/15): K 4.4, creatinine 1.9, HCT 33.8, LDL 145 Labs (11/15): K 4.4, creatinine 1.47, BNP 1863 Labs (2/16) K 4.4, creatinine 1.65 Labs (02/22/15) K 4.2, creatinine 1.81 (by Dr Felipa Eth her PCP) Labs (6/16): K 4.3, creatinine 1.86 Labs (1/17): K 43.9, creatinine 2.29  PMH: 1. HTN: Clonidine caused a dry mouth. BB caused serious cough 2. Hyperlipidemia 3. GERD 4. Aortic stenosis: s/p bioprosthetic aortic valve replacement in 2/11.  5. CAD: s/p SVG-OM in 2/11 with AVR.  6. Tremor 7. Multinodular goiter 8. Chronic diastolic CHF: Echo (A999333) with EF 55-60%, mild LVH, moderate diastolic dysfunction, normal bioprosthetic aortic valve,  mild MR, PA systolic pressure 37 mmHg.   SH: Nonsmoker, lives at [facility] independent living, daughter is in town.   FH: No premature CAD.  ROS: All systems reviewed and negative except as per HPI.   Current Outpatient Prescriptions  Medication Sig Dispense Refill  . aspirin 81 MG tablet Take 81 mg by mouth daily.    . calcium citrate-vitamin D (CALCIUM CITRATE +) 315-200 MG-UNIT per tablet Take 1 tablet by mouth 2 (two) times daily.    Marland Kitchen doxazosin (CARDURA) 2 MG tablet Take 1 tablet (2 mg total) by mouth 2 (two) times daily. May take an additional tablet as needed for systolic blood pressure greater then 170. 60 tablet 6  . furosemide (LASIX) 80 MG tablet Take 40 mg by mouth 2 (two) times daily.    Marland Kitchen ketotifen (THERA TEARS ALLERGY) 0.025 % ophthalmic solution Place 1 drop into both eyes as needed (for dry eyes).    . LORazepam (ATIVAN) 0.5 MG tablet Take 0.5 mg by mouth at bedtime.     . magnesium hydroxide (MILK OF MAGNESIA) 400 MG/5ML suspension Take 15 mLs by mouth at bedtime.    . Multiple Vitamins-Minerals (CENTRUM SILVER ULTRA WOMENS PO) Take 1 tablet by mouth daily.    . Multiple Vitamins-Minerals (PRESERVISION AREDS) TABS Take 1 tablet by mouth daily.    Marland Kitchen NIFEdipine (PROCARDIA XL/ADALAT-CC) 90 MG 24 hr tablet Take 90 mg by mouth 2 (two) times daily.    . potassium chloride SA (K-DUR,KLOR-CON)  20 MEQ tablet TAKE 1 TABLET TWICE DAILY. 60 tablet 0  . pravastatin (PRAVACHOL) 40 MG tablet TAKE 1 TABLET ONCE DAILY IN THE EVENING. 30 tablet 3  . metolazone (ZAROXOLYN) 5 MG tablet TAKE 1 TABLET TWICE DAILY. (Patient not taking: Reported on 11/13/2016) 60 tablet 3   No current facility-administered medications for this encounter.    BP (!) 160/52 (BP Location: Left Arm, Patient Position: Sitting, Cuff Size: Normal)   Pulse 82   Wt 114 lb 12.8 oz (52.1 kg)   SpO2 92%   BMI 21.69 kg/m    Wt Readings from Last 3 Encounters:  11/13/16 114 lb 12.8 oz (52.1 kg)  05/19/16 115 lb 8  oz (52.4 kg)  01/15/16 114 lb (51.7 kg)    General: NAD. Elderly frail  HEENT: Normal.  Neck: JVP 6. No thyromegaly or lymphadenopathy appreciated. Lungs: crackles at bases left > right CV: Nondisplaced PMI.  Heart regular S1/S2, no S3/S4, 2/6 early SEM RUSB with mild radiation to carotids.  No  edema. Normal pedal pulses.  Abdomen: Soft, NT, ND, no HSM. No bruits or masses. +BS  Skin: Intact without lesions or rashes.  Neurologic: Alert and oriented x 3.  Psych: Normal affect. Extremities: No clubbing or cyanosis.    Assessment/Plan: 1. Chronic diastolic CHF:   -  Volume status stable on exam and NYHA class II.  - Continue current lasix regimen. Have asked her to make sure she is not taking metolazone bid. Her daughter will check meds and call us.  2. HTN:  - BP remains elevated at home. Previously our established goal has been 140-160. She is a bit above this.  - Continue Nifedipine 90 BID - Intolerant of BB and clonidine and Hydralazine has not been effective in the past.  - Increase doxazosin to 3 mg BID.Can still take an extra 1 mg at night as needed goal SBP 140-160 3. CKD stage IV:  - Recheck today 4. Bioprosthetic aortic valve:  - Stable by echo 06/2014. Mean gradient 16 mm Hg, Peak 32 mm Hg. Will plan on echo at next visit.    - She knows to take 4 amoxicillin for surgical prophylaxis with her valve. 5. CAD: s/p CABG with SVG-OM at time of AVR.   - She remains on ASA 81 and statin.  - No CP 6. Abnormal lung exam  - Have encouraged her to use incentive spirometer  Lemarcus Baggerly, Quillian Quince, MD 11/13/2016   Addendum: Patient's daughter called and patient is taking metolazone 5 bid every day. We have stopped. Hold diuretics for the weekend and increase lasix back to 80 bid. Recheck creatinine next week. Watch volume status closely.   Keriann Rankin,MD 9:42 PM

## 2016-11-13 NOTE — Telephone Encounter (Signed)
Patient's daughter confirms patient is taking metolazone 5 mg tablet twice daily. Per Dr. Haroldine Laws, advised to STOP metolazone, hold lasix through the weekend, and on Monday morning resume lasix at 80 mg BID. Patient scheduled to repeat BMET in 10 days. Daughter repeated instructions with understanding.  Renee Pain, RN

## 2016-11-13 NOTE — Patient Instructions (Signed)
Labs today (will call for abnormal results, otherwise no news is good news)  Increase Doxazosin to 3 mg (1.5 Tablets) Two times Daily  Follow up in 4 Months with Echo.

## 2016-11-17 DIAGNOSIS — H353211 Exudative age-related macular degeneration, right eye, with active choroidal neovascularization: Secondary | ICD-10-CM | POA: Diagnosis not present

## 2016-11-24 ENCOUNTER — Ambulatory Visit (HOSPITAL_COMMUNITY)
Admission: RE | Admit: 2016-11-24 | Discharge: 2016-11-24 | Disposition: A | Discharge status: Home / Self Care | Payer: Medicare Other | Source: Ambulatory Visit | Attending: Cardiology | Admitting: Cardiology

## 2016-11-24 DIAGNOSIS — I5022 Chronic systolic (congestive) heart failure: Secondary | ICD-10-CM | POA: Diagnosis not present

## 2016-11-24 LAB — BASIC METABOLIC PANEL WITH GFR
Anion gap: 10 (ref 5–15)
BUN: 37 mg/dL — ABNORMAL HIGH (ref 6–20)
CO2: 28 mmol/L (ref 22–32)
Calcium: 9.6 mg/dL (ref 8.9–10.3)
Chloride: 101 mmol/L (ref 101–111)
Creatinine, Ser: 2.88 mg/dL — ABNORMAL HIGH (ref 0.44–1.00)
GFR calc Af Amer: 15 mL/min — ABNORMAL LOW
GFR calc non Af Amer: 13 mL/min — ABNORMAL LOW
Glucose, Bld: 124 mg/dL — ABNORMAL HIGH (ref 65–99)
Potassium: 4.5 mmol/L (ref 3.5–5.1)
Sodium: 139 mmol/L (ref 135–145)

## 2017-01-27 ENCOUNTER — Other Ambulatory Visit: Payer: Self-pay | Admitting: Geriatric Medicine

## 2017-01-27 ENCOUNTER — Ambulatory Visit
Admission: RE | Admit: 2017-01-27 | Discharge: 2017-01-27 | Disposition: A | Discharge status: Home / Self Care | Payer: Medicare Other | Source: Ambulatory Visit | Attending: Geriatric Medicine | Admitting: Geriatric Medicine

## 2017-01-27 DIAGNOSIS — S9032XA Contusion of left foot, initial encounter: Secondary | ICD-10-CM

## 2017-01-27 DIAGNOSIS — I129 Hypertensive chronic kidney disease with stage 1 through stage 4 chronic kidney disease, or unspecified chronic kidney disease: Secondary | ICD-10-CM | POA: Diagnosis not present

## 2017-01-27 DIAGNOSIS — N184 Chronic kidney disease, stage 4 (severe): Secondary | ICD-10-CM | POA: Diagnosis not present

## 2017-01-28 ENCOUNTER — Other Ambulatory Visit (HOSPITAL_COMMUNITY): Payer: Self-pay | Admitting: Internal Medicine

## 2017-02-16 ENCOUNTER — Encounter (HOSPITAL_COMMUNITY): Payer: Self-pay

## 2017-02-16 ENCOUNTER — Emergency Department (HOSPITAL_COMMUNITY): Payer: Medicare Other

## 2017-02-16 ENCOUNTER — Inpatient Hospital Stay (HOSPITAL_COMMUNITY)
Admission: EM | Admit: 2017-02-16 | Discharge: 2017-02-21 | DRG: 291 | Disposition: A | Discharge status: Home / Self Care | Payer: Medicare Other | Attending: Family Medicine | Admitting: Family Medicine

## 2017-02-16 DIAGNOSIS — Z7982 Long term (current) use of aspirin: Secondary | ICD-10-CM

## 2017-02-16 DIAGNOSIS — Z888 Allergy status to other drugs, medicaments and biological substances status: Secondary | ICD-10-CM | POA: Diagnosis not present

## 2017-02-16 DIAGNOSIS — Z8249 Family history of ischemic heart disease and other diseases of the circulatory system: Secondary | ICD-10-CM | POA: Diagnosis not present

## 2017-02-16 DIAGNOSIS — I472 Ventricular tachycardia, unspecified: Secondary | ICD-10-CM

## 2017-02-16 DIAGNOSIS — M810 Age-related osteoporosis without current pathological fracture: Secondary | ICD-10-CM | POA: Diagnosis present

## 2017-02-16 DIAGNOSIS — J9601 Acute respiratory failure with hypoxia: Secondary | ICD-10-CM | POA: Diagnosis present

## 2017-02-16 DIAGNOSIS — I340 Nonrheumatic mitral (valve) insufficiency: Secondary | ICD-10-CM | POA: Diagnosis not present

## 2017-02-16 DIAGNOSIS — Z952 Presence of prosthetic heart valve: Secondary | ICD-10-CM

## 2017-02-16 DIAGNOSIS — Z66 Do not resuscitate: Secondary | ICD-10-CM | POA: Diagnosis present

## 2017-02-16 DIAGNOSIS — Z882 Allergy status to sulfonamides status: Secondary | ICD-10-CM

## 2017-02-16 DIAGNOSIS — I25810 Atherosclerosis of coronary artery bypass graft(s) without angina pectoris: Secondary | ICD-10-CM | POA: Diagnosis not present

## 2017-02-16 DIAGNOSIS — I5033 Acute on chronic diastolic (congestive) heart failure: Secondary | ICD-10-CM | POA: Diagnosis present

## 2017-02-16 DIAGNOSIS — E876 Hypokalemia: Secondary | ICD-10-CM | POA: Diagnosis not present

## 2017-02-16 DIAGNOSIS — R05 Cough: Secondary | ICD-10-CM | POA: Diagnosis not present

## 2017-02-16 DIAGNOSIS — R069 Unspecified abnormalities of breathing: Secondary | ICD-10-CM | POA: Diagnosis not present

## 2017-02-16 DIAGNOSIS — E7800 Pure hypercholesterolemia, unspecified: Secondary | ICD-10-CM | POA: Diagnosis present

## 2017-02-16 DIAGNOSIS — Z953 Presence of xenogenic heart valve: Secondary | ICD-10-CM

## 2017-02-16 DIAGNOSIS — Z881 Allergy status to other antibiotic agents status: Secondary | ICD-10-CM | POA: Diagnosis not present

## 2017-02-16 DIAGNOSIS — I10 Essential (primary) hypertension: Secondary | ICD-10-CM

## 2017-02-16 DIAGNOSIS — R0602 Shortness of breath: Secondary | ICD-10-CM | POA: Diagnosis not present

## 2017-02-16 DIAGNOSIS — I130 Hypertensive heart and chronic kidney disease with heart failure and stage 1 through stage 4 chronic kidney disease, or unspecified chronic kidney disease: Secondary | ICD-10-CM | POA: Diagnosis not present

## 2017-02-16 DIAGNOSIS — I110 Hypertensive heart disease with heart failure: Secondary | ICD-10-CM | POA: Diagnosis not present

## 2017-02-16 DIAGNOSIS — N184 Chronic kidney disease, stage 4 (severe): Secondary | ICD-10-CM | POA: Diagnosis present

## 2017-02-16 DIAGNOSIS — R0902 Hypoxemia: Secondary | ICD-10-CM

## 2017-02-16 DIAGNOSIS — K219 Gastro-esophageal reflux disease without esophagitis: Secondary | ICD-10-CM | POA: Diagnosis present

## 2017-02-16 DIAGNOSIS — I509 Heart failure, unspecified: Secondary | ICD-10-CM | POA: Diagnosis not present

## 2017-02-16 DIAGNOSIS — D631 Anemia in chronic kidney disease: Secondary | ICD-10-CM | POA: Diagnosis present

## 2017-02-16 HISTORY — DX: Unspecified malignant neoplasm of skin of lip: C44.00

## 2017-02-16 HISTORY — DX: Chronic kidney disease, stage 4 (severe): N18.4

## 2017-02-16 HISTORY — DX: Pure hypercholesterolemia, unspecified: E78.00

## 2017-02-16 HISTORY — DX: Personal history of other diseases of the digestive system: Z87.19

## 2017-02-16 HISTORY — DX: Personal history of peptic ulcer disease: Z87.11

## 2017-02-16 HISTORY — DX: Unspecified macular degeneration: H35.30

## 2017-02-16 LAB — COMPREHENSIVE METABOLIC PANEL
ALK PHOS: 38 U/L (ref 38–126)
ALT: 18 U/L (ref 14–54)
AST: 24 U/L (ref 15–41)
Albumin: 3.8 g/dL (ref 3.5–5.0)
Anion gap: 13 (ref 5–15)
BUN: 71 mg/dL — ABNORMAL HIGH (ref 6–20)
CALCIUM: 9.8 mg/dL (ref 8.9–10.3)
CHLORIDE: 97 mmol/L — AB (ref 101–111)
CO2: 27 mmol/L (ref 22–32)
Creatinine, Ser: 3.39 mg/dL — ABNORMAL HIGH (ref 0.44–1.00)
GFR, EST AFRICAN AMERICAN: 12 mL/min — AB (ref >60)
GFR, EST NON AFRICAN AMERICAN: 11 mL/min — AB (ref >60)
Glucose, Bld: 99 mg/dL (ref 65–99)
Potassium: 4.7 mmol/L (ref 3.5–5.1)
SODIUM: 137 mmol/L (ref 135–145)
Total Bilirubin: 0.6 mg/dL (ref 0.3–1.2)
Total Protein: 7 g/dL (ref 6.5–8.1)

## 2017-02-16 LAB — CBC WITH DIFFERENTIAL/PLATELET
Basophils Absolute: 0 10*3/uL (ref 0.0–0.1)
Basophils Relative: 0 %
EOS PCT: 10 %
Eosinophils Absolute: 0.8 10*3/uL — ABNORMAL HIGH (ref 0.0–0.7)
HEMATOCRIT: 31.7 % — AB (ref 36.0–46.0)
Hemoglobin: 10.1 g/dL — ABNORMAL LOW (ref 12.0–15.0)
LYMPHS PCT: 13 %
Lymphs Abs: 1.1 10*3/uL (ref 0.7–4.0)
MCH: 28.1 pg (ref 26.0–34.0)
MCHC: 31.9 g/dL (ref 30.0–36.0)
MCV: 88.1 fL (ref 78.0–100.0)
MONO ABS: 0.6 10*3/uL (ref 0.1–1.0)
MONOS PCT: 7 %
Neutro Abs: 5.8 10*3/uL (ref 1.7–7.7)
Neutrophils Relative %: 70 %
PLATELETS: 210 10*3/uL (ref 150–400)
RBC: 3.6 MIL/uL — ABNORMAL LOW (ref 3.87–5.11)
RDW: 15.1 % (ref 11.5–15.5)
WBC: 8.2 10*3/uL (ref 4.0–10.5)

## 2017-02-16 LAB — PROTIME-INR
INR: 1.07
Prothrombin Time: 14 seconds (ref 11.4–15.2)

## 2017-02-16 LAB — I-STAT CG4 LACTIC ACID, ED
LACTIC ACID, VENOUS: 0.76 mmol/L (ref 0.5–1.9)
Lactic Acid, Venous: 0.78 mmol/L (ref 0.5–1.9)

## 2017-02-16 LAB — I-STAT TROPONIN, ED
TROPONIN I, POC: 0.05 ng/mL (ref 0.00–0.08)
Troponin i, poc: 0.06 ng/mL (ref 0.00–0.08)

## 2017-02-16 LAB — BRAIN NATRIURETIC PEPTIDE: B Natriuretic Peptide: 1244.4 pg/mL — ABNORMAL HIGH (ref 0.0–100.0)

## 2017-02-16 MED ORDER — FUROSEMIDE 10 MG/ML IJ SOLN
80.0000 mg | Freq: Two times a day (BID) | INTRAMUSCULAR | Status: AC
  Administered 2017-02-17 – 2017-02-20 (×8): 80 mg via INTRAVENOUS
  Filled 2017-02-16 (×9): qty 8

## 2017-02-16 MED ORDER — FUROSEMIDE 10 MG/ML IJ SOLN
40.0000 mg | Freq: Once | INTRAMUSCULAR | Status: AC
  Administered 2017-02-16: 40 mg via INTRAVENOUS
  Filled 2017-02-16: qty 4

## 2017-02-16 MED ORDER — ACETAMINOPHEN 325 MG PO TABS
650.0000 mg | ORAL_TABLET | Freq: Four times a day (QID) | ORAL | Status: DC | PRN
  Administered 2017-02-17: 650 mg via ORAL
  Filled 2017-02-16: qty 2

## 2017-02-16 MED ORDER — POTASSIUM CHLORIDE CRYS ER 20 MEQ PO TBCR
20.0000 meq | EXTENDED_RELEASE_TABLET | Freq: Two times a day (BID) | ORAL | Status: DC
  Administered 2017-02-17 – 2017-02-21 (×10): 20 meq via ORAL
  Filled 2017-02-16 (×10): qty 1

## 2017-02-16 MED ORDER — DOXAZOSIN MESYLATE 2 MG PO TABS
3.0000 mg | ORAL_TABLET | Freq: Two times a day (BID) | ORAL | Status: DC
  Administered 2017-02-17 – 2017-02-21 (×10): 3 mg via ORAL
  Filled 2017-02-16 (×12): qty 1

## 2017-02-16 MED ORDER — MAGNESIUM SULFATE 2 GM/50ML IV SOLN
2.0000 g | Freq: Once | INTRAVENOUS | Status: AC
  Administered 2017-02-16: 2 g via INTRAVENOUS
  Filled 2017-02-16: qty 50

## 2017-02-16 MED ORDER — NIFEDIPINE ER 60 MG PO TB24
60.0000 mg | ORAL_TABLET | Freq: Two times a day (BID) | ORAL | Status: DC
Start: 2017-02-16 — End: 2017-02-21
  Administered 2017-02-17 – 2017-02-21 (×10): 60 mg via ORAL
  Filled 2017-02-16 (×12): qty 1

## 2017-02-16 MED ORDER — METHYLPREDNISOLONE SODIUM SUCC 125 MG IJ SOLR
125.0000 mg | Freq: Once | INTRAMUSCULAR | Status: AC
  Administered 2017-02-16: 125 mg via INTRAVENOUS
  Filled 2017-02-16: qty 2

## 2017-02-16 MED ORDER — LORAZEPAM 0.5 MG PO TABS
0.5000 mg | ORAL_TABLET | Freq: Every day | ORAL | Status: DC
  Administered 2017-02-17 – 2017-02-20 (×5): 0.5 mg via ORAL
  Filled 2017-02-16 (×5): qty 1

## 2017-02-16 MED ORDER — HEPARIN SODIUM (PORCINE) 5000 UNIT/ML IJ SOLN
5000.0000 [IU] | Freq: Three times a day (TID) | INTRAMUSCULAR | Status: DC
  Administered 2017-02-17 – 2017-02-21 (×12): 5000 [IU] via SUBCUTANEOUS
  Filled 2017-02-16 (×13): qty 1

## 2017-02-16 MED ORDER — PROSIGHT PO TABS
1.0000 | ORAL_TABLET | Freq: Two times a day (BID) | ORAL | Status: DC
  Administered 2017-02-17 – 2017-02-21 (×10): 1 via ORAL
  Filled 2017-02-16 (×10): qty 1

## 2017-02-16 MED ORDER — PRAVASTATIN SODIUM 40 MG PO TABS
40.0000 mg | ORAL_TABLET | Freq: Every day | ORAL | Status: DC
  Administered 2017-02-17 – 2017-02-20 (×4): 40 mg via ORAL
  Filled 2017-02-16 (×4): qty 1

## 2017-02-16 MED ORDER — CALCIUM CARBONATE-VITAMIN D 500-200 MG-UNIT PO TABS
1.0000 | ORAL_TABLET | Freq: Two times a day (BID) | ORAL | Status: DC
  Administered 2017-02-17 – 2017-02-21 (×10): 1 via ORAL
  Filled 2017-02-16 (×10): qty 1

## 2017-02-16 MED ORDER — IPRATROPIUM-ALBUTEROL 0.5-2.5 (3) MG/3ML IN SOLN
3.0000 mL | Freq: Once | RESPIRATORY_TRACT | Status: AC
  Administered 2017-02-16: 3 mL via RESPIRATORY_TRACT
  Filled 2017-02-16: qty 3

## 2017-02-16 MED ORDER — KETOTIFEN FUMARATE 0.025 % OP SOLN: 1.0000 [drp] | Freq: Three times a day (TID) | OPHTHALMIC | Status: DC | PRN

## 2017-02-16 MED ORDER — ACETAMINOPHEN 650 MG RE SUPP: 650.0000 mg | Freq: Four times a day (QID) | RECTAL | Status: DC | PRN

## 2017-02-16 NOTE — ED Notes (Signed)
Pt changed from bipap to 4LTRS [place], pt reports breathing much improved

## 2017-02-16 NOTE — ED Provider Notes (Signed)
[facility] DEPT Provider Note   CSN: [id number] Arrival date & time: 02/16/17  1617     History   Chief Complaint Chief Complaint  Patient presents with  . Shortness of Breath    increased SOB for the past few days     HPI Victoria Banks is a 83 y.o. female.  The history is provided by the patient, a relative and medical records.  Shortness of Breath  This is a new problem. The average episode lasts 4 days. The problem occurs continuously.The current episode started more than 2 days ago. The problem has been rapidly worsening. Associated symptoms include wheezing. Pertinent negatives include no fever, no headaches, no rhinorrhea, no neck pain, no cough, no sputum production, no hemoptysis, no chest pain, no syncope, no vomiting, no abdominal pain, no rash, no leg pain and no leg swelling. She has tried nothing for the symptoms. The treatment provided no relief. She has had prior hospitalizations. She has had no prior ED visits. Associated medical issues include heart failure. Associated medical issues do not include DVT.    Past Medical History:  Diagnosis Date  . Allergic rhinitis   . Aortic stenosis, severe 08/2009   s/p AVR w pericardial tissue valve 10/2009  . Atelectasis    scarring at basis on CXR  . Benign familial tremor   . Chronic diastolic (congestive) heart failure (HCC)    a. EF of 55-60% in 08/2015  . Coronary artery disease 10/2009   S/P SVG to OM   . CRD (chronic renal disease), stage III   . Fibrocystic breast disease   . GERD (gastroesophageal reflux disease)   . Hypertension   . Multinodular goiter   . Osteoporosis 12/2010   Reclast given  . Positive for microalbuminuria    On tevetan    Patient Active Problem List   Diagnosis Date Noted  . Pulmonary hypertension ([place])   . Essential hypertension   . CAD (coronary artery disease) of artery bypass graft   . S/P AVR   . Chronic diastolic CHF (congestive heart failure) ([place]) 08/15/2014  . CKD  (chronic kidney disease) stage 4, GFR 15-29 ml/min (HCC) 07/24/2014  . Pure hypercholesterolemia 11/15/2013    Past Surgical History:  Procedure Laterality Date  . AORTIC VALVE REPLACEMENT  2011  . CORONARY ARTERY BYPASS GRAFT  2011   SVG-OM    OB History    No data available       Home Medications    Prior to Admission medications   Medication Sig Start Date End Date Taking? Authorizing Provider  aspirin 81 MG tablet Take 81 mg by mouth daily.    [provider]  calcium citrate-vitamin D (CALCIUM CITRATE +) 315-200 MG-UNIT per tablet Take 1 tablet by mouth 2 (two) times daily.    [provider]  doxazosin (CARDURA) 2 MG tablet Take 1.5 tablets (3 mg total) by mouth 2 (two) times daily. 11/13/16   Bensimhon, Shaune Pascal, MD  furosemide (LASIX) 80 MG tablet Take 1 tablet (80 mg total) by mouth 2 (two) times daily. 11/13/16   Bensimhon, Shaune Pascal, MD  ketotifen (THERA TEARS ALLERGY) 0.025 % ophthalmic solution Place 1 drop into both eyes as needed (for dry eyes).    [provider]  LORazepam (ATIVAN) 0.5 MG tablet Take 0.5 mg by mouth at bedtime.     [provider]  magnesium hydroxide (MILK OF MAGNESIA) 400 MG/5ML suspension Take 15 mLs by mouth at bedtime.  [provider]  Multiple Vitamins-Minerals (CENTRUM SILVER ULTRA WOMENS PO) Take 1 tablet by mouth daily.    [provider]  Multiple Vitamins-Minerals (PRESERVISION AREDS) TABS Take 1 tablet by mouth daily.    [provider]  NIFEdipine (PROCARDIA XL/ADALAT-CC) 90 MG 24 hr tablet Take 90 mg by mouth 2 (two) times daily.    [provider]  potassium chloride SA (K-DUR,KLOR-CON) 20 MEQ tablet TAKE 1 TABLET TWICE DAILY. 06/25/15   Larey Dresser, MD  pravastatin (PRAVACHOL) 40 MG tablet TAKE 1 TABLET ONCE DAILY IN THE EVENING. 02/01/17   Bensimhon, Shaune Pascal, MD    Family History Family History  Problem Relation Age of Onset  . Coronary artery disease  Mother   . Coronary artery disease Father   . Hyperlipidemia Daughter     Social History Social History  Substance Use Topics  . Smoking status: Never Smoker  . Smokeless tobacco: Never Used  . Alcohol use No     Allergies   Aceon [perindopril]; Beta adrenergic blockers; Hydralazine hcl; Actonel [risedronate sodium]; Allegra [fexofenadine]; Biaxin [clarithromycin]; Ciprofloxacin; Crestor [rosuvastatin]; Fosamax [alendronate sodium]; Lipitor [atorvastatin]; Promethazine hcl; Simvastatin; Sulfa antibiotics; and Welchol [colesevelam hcl]   Review of Systems Review of Systems  Constitutional: Positive for fatigue. Negative for chills, diaphoresis and fever.  HENT: Negative for congestion and rhinorrhea.   Eyes: Negative for visual disturbance.  Respiratory: Positive for shortness of breath and wheezing. Negative for cough, hemoptysis, sputum production, chest tightness and stridor.   Cardiovascular: Negative for chest pain, leg swelling and syncope.  Gastrointestinal: Negative for abdominal pain, constipation, diarrhea, nausea and vomiting.  Genitourinary: Negative for dysuria and flank pain.  Musculoskeletal: Negative for back pain, neck pain and neck stiffness.  Skin: Negative for rash and wound.  Neurological: Negative for dizziness, light-headedness, numbness and headaches.  Psychiatric/Behavioral: Negative for agitation.  All other systems reviewed and are negative.    Physical Exam Updated Vital Signs BP (!) 204/70   Pulse 77   Ht 5' (1.524 m)   Wt 54.4 kg (120 lb)   SpO2 94%   BMI 23.44 kg/m   Physical Exam  Constitutional: She is oriented to person, place, and time. She appears well-developed and well-nourished. No distress.  HENT:  Head: Normocephalic and atraumatic.  Mouth/Throat: Oropharynx is clear and moist. No oropharyngeal exudate.  Eyes: Conjunctivae and EOM are normal. Pupils are equal, round, and reactive to light.  Neck: Normal range of motion. Neck  supple.  Cardiovascular: Normal rate, regular rhythm, normal heart sounds and intact distal pulses.   No murmur heard. Pulmonary/Chest: No stridor. Tachypnea noted. No respiratory distress. She has decreased breath sounds. She has wheezes. She has no rhonchi. She has rales. She exhibits no tenderness.  Abdominal: Soft. There is no tenderness.  Musculoskeletal: She exhibits no edema.  Neurological: She is alert and oriented to person, place, and time. No cranial nerve deficit or sensory deficit. She exhibits normal muscle tone. Coordination normal.  Skin: Skin is warm and dry. Capillary refill takes less than 2 seconds. No rash noted. She is not diaphoretic. No erythema.  Psychiatric: She has a normal mood and affect.  Nursing note and vitals reviewed.    ED Treatments / Results  Labs (all labs ordered are listed, but only abnormal results are displayed) Labs Reviewed  CBC WITH DIFFERENTIAL/PLATELET - Abnormal; Notable for the following:       Result Value   RBC 3.60 (*)    Hemoglobin 10.1 (*)  HCT 31.7 (*)    Eosinophils Absolute 0.8 (*)    All other components within normal limits  COMPREHENSIVE METABOLIC PANEL - Abnormal; Notable for the following:    Chloride 97 (*)    BUN 71 (*)    Creatinine, Ser 3.39 (*)    GFR calc non Af Amer 11 (*)    GFR calc Af Amer 12 (*)    All other components within normal limits  BRAIN NATRIURETIC PEPTIDE - Abnormal; Notable for the following:    B Natriuretic Peptide 1,244.4 (*)    All other components within normal limits  TROPONIN I - Abnormal; Notable for the following:    Troponin I 0.05 (*)    All other components within normal limits  TROPONIN I - Abnormal; Notable for the following:    Troponin I 0.05 (*)    All other components within normal limits  BASIC METABOLIC PANEL - Abnormal; Notable for the following:    Chloride 97 (*)    Glucose, Bld 149 (*)    BUN 65 (*)    Creatinine, Ser 3.01 (*)    GFR calc non Af Amer 12 (*)     GFR calc Af Amer 14 (*)    All other components within normal limits  CBC - Abnormal; Notable for the following:    RBC 3.42 (*)    Hemoglobin 9.6 (*)    HCT 30.1 (*)    All other components within normal limits  MAGNESIUM - Abnormal; Notable for the following:    Magnesium 3.8 (*)    All other components within normal limits  MRSA PCR SCREENING  PROTIME-INR  TSH  TROPONIN I  I-STAT CG4 LACTIC ACID, ED  I-STAT TROPOININ, ED  I-STAT CG4 LACTIC ACID, ED  Randolm Idol, ED    EKG  EKG Interpretation  Date/Time:  Tuesday Feb 16 2017 16:25:52 EDT Ventricular Rate:  81 PR Interval:    QRS Duration: 103 QT Interval:  417 QTC Calculation: 485 R Axis:   85 Text Interpretation:  Sinus rhythm Probable left atrial enlargement Anterior infarct, old Minimal ST depression, diffuse leads when comapred to prior, no significant changes seen.  No STEMI Confirmed by Antony Blackbird [phone]) on 02/16/2017 4:52:31 PM       Radiology Dg Chest Portable 1 View  Result Date: 02/16/2017 CLINICAL DATA:  Dry cough and short of breath EXAM: PORTABLE CHEST 1 VIEW COMPARISON:  10/02/2015 FINDINGS: Post sternotomy changes and valve prosthesis. Widened right AC joint as before with possible postsurgical changes of distal right clavicle. Hyperinflation with coarse interstitial opacities some of which are likely chronic. Streaky atelectasis or infiltrate at the left base. Trace pleural effusions. Cardiomegaly. IMPRESSION: 1. Streaky atelectasis or mild infiltrate at the left base with trace effusions 2. Cardiomegaly 3. Diffuse coarse interstitial opacities, likely chronic. Electronically Signed   By: Donavan Foil M.D.   On: 02/16/2017 18:17    Procedures Procedures (including critical care time)  CRITICAL CARE Performed by: Gwenyth Allegra Taneia Mealor Total critical care time: 45 minutes Critical care time was exclusive of separately billable procedures and treating other patients. Critical care was necessary  to treat or prevent imminent or life-threatening deterioration. CHF exacerbation that required Bipap and lasix before admission Critical care was time spent personally by me on the following activities: development of treatment plan with patient and/or surrogate as well as nursing, discussions with consultants, evaluation of patient's response to treatment, examination of patient, obtaining history from patient or surrogate, ordering and  performing treatments and interventions, ordering and review of laboratory studies, ordering and review of radiographic studies, pulse oximetry and re-evaluation of patient's condition.   Medications Ordered in ED Medications  NIFEdipine (PROCARDIA-XL/ADALAT CC) 24 hr tablet 60 mg (60 mg Oral Given 02/17/17 0841)  pravastatin (PRAVACHOL) tablet 40 mg (not administered)  doxazosin (CARDURA) tablet 3 mg (3 mg Oral Given 02/17/17 0840)  multivitamin (PROSIGHT) tablet 1 tablet (1 tablet Oral Given 02/17/17 0841)  potassium chloride SA (K-DUR,KLOR-CON) CR tablet 20 mEq (20 mEq Oral Given 02/17/17 0841)  ketotifen (ZADITOR) 0.025 % ophthalmic solution 1 drop (not administered)  calcium-vitamin D (OSCAL WITH D) 500-200 MG-UNIT per tablet 1 tablet (1 tablet Oral Given 02/17/17 0841)  LORazepam (ATIVAN) tablet 0.5 mg (0.5 mg Oral Given 02/17/17 0023)  acetaminophen (TYLENOL) tablet 650 mg (not administered)    Or  acetaminophen (TYLENOL) suppository 650 mg (not administered)  furosemide (LASIX) injection 80 mg (80 mg Intravenous Given 02/17/17 0400)  heparin injection 5,000 Units (5,000 Units Subcutaneous Given 02/17/17 0605)  nitroGLYCERIN (NITRODUR - Dosed in mg/24 hr) patch 0.4 mg (0.4 mg Transdermal Patch Applied 02/17/17 0839)  methylPREDNISolone sodium succinate (SOLU-MEDROL) 125 mg/2 mL injection 125 mg (125 mg Intravenous Given 02/16/17 1706)  magnesium sulfate IVPB 2 g 50 mL (0 g Intravenous Stopped 02/16/17 1953)  ipratropium-albuterol (DUONEB) 0.5-2.5 (3) MG/3ML  nebulizer solution 3 mL (3 mLs Nebulization Given 02/16/17 1634)  furosemide (LASIX) injection 40 mg (40 mg Intravenous Given 02/16/17 2059)     Initial Impression / Assessment and Plan / ED Course  I have reviewed the triage vital signs and the nursing notes.  Pertinent labs & imaging results that were available during my care of the patient were reviewed by me and considered in my medical decision making (see chart for details).     Victoria Banks is a 83 y.o. female with a past medical history significant for CK D, CHF, CAD status post CABG, and hypertension who presents for shortness of breath. Patient says that over the last several days, she has had worsening shortness of breath. She says she is not mentally flat and had to sleep in a chair. She says that she has not had fevers, chills, or productive cough. She denies any chest pain. She denies any leg swelling. She denies any nausea, vomiting, constipation, diarrhea or dysuria.  PT found to Oxygen sat in the 70's on nasal cannula O2 with EMS.   Patient examined by me. Patient found to have severe wheezing as well as crackles in both lungs. Suspect COPD versus CHF exacerbation. Patient will have laboratory testing as well as imaging.   Patient will be given Solu-Medrol, magnesium, and DuoNeb treatment. Patient also have imaging to look for flash pulmonary edema.   EKG  revealed no significant changes from prior.  Patient was placed on BiPAP due to severe hypoxia. Anticipate admission for respiratory failure.   Patient's workup revealed elevated BNP. This fit with the clinical impression of fluid overload in the lungs from the crackles and chest x-ray. Clinically, do not feel patient has pneumonia. Metabolic panel showed elevation in creatinine. No leukocytosis. Lactic acid and troponin not elevated.  Suspect CHF exacerbation with possible component of wheezing. Patient escalated from BiPAP to nasal cannula oxygen and continues to  maintain her oxygen saturations on several liters nasal cannula. Patient will be given Lasix for diuresis and admitted for hypoxia and CHF exacerbation.   Final Clinical Impressions(s) / ED Diagnoses   Final diagnoses:  Acute on chronic congestive heart failure, unspecified heart failure type (HCC)  Hypoxia    Clinical Impression: 1. Acute on chronic congestive heart failure, unspecified heart failure type ([place])   2. Hypoxia     Disposition: Admit to Hospitalist service    Malania Gawthrop, Gwenyth Allegra, MD 02/17/17 1150

## 2017-02-16 NOTE — ED Triage Notes (Signed)
Pt arrives appears SOB ER MD to room pt able to answer all questions

## 2017-02-16 NOTE — H&P (Addendum)
History and Physical    Victoria Banks VVO:[id number] DOB: Jan 02, 1921 DOA: 02/16/2017  PCP: Lajean Manes, MD  Patient coming from: [facility] living facility.  Chief Complaint: Shortness of breath.  HPI: Victoria Banks is a 83 y.o. female with history of diastolic CHF, chronic kidney disease, bioprosthetic aortic valve replacement, chronic anemia, CAD status post CABG presents to the ER with complaints of shortness of breath. Patient states over the last few days patient has been getting progressive short of breath particularly on exertion. Denies any chest pain fever chills or productive cough.   ED Course: In the ER patient was found to be hypoxic and short of breath and was initially placed on BiPAP. Patient was given Lasix 40 mg IV following which patient's shortness of breath improved. Patient also was given initially nebulizers and steroids. Patient was slowly weaned off the BiPAP. On exam patient still appears mildly short of breath with elevated JVD. Patient has at least gain 5 pounds from her baseline. Patient states she has been compliant with her medications.  Review of Systems: As per HPI, rest all negative.   Past Medical History:  Diagnosis Date  . Allergic rhinitis   . Aortic stenosis, severe 08/2009   s/p AVR w pericardial tissue valve 10/2009  . Arthritis   . Atelectasis    scarring at basis on CXR  . Benign familial tremor   . Chronic diastolic (congestive) heart failure (HCC)    a. EF of 55-60% in 08/2015  . Coronary artery disease 10/2009   S/P SVG to OM   . CRD (chronic renal disease), stage IV ([place])   . Fibrocystic breast disease   . GERD (gastroesophageal reflux disease)   . High cholesterol   . History of stomach ulcers   . Hypertension   . Macular degeneration, right eye   . Multinodular goiter   . Osteoporosis 12/2010   Reclast given  . Positive for microalbuminuria    On tevetan  . Skin cancer of lip    "had MOHS on upper lip"    Past  Surgical History:  Procedure Laterality Date  . AORTIC VALVE REPLACEMENT  2011  . CARDIAC VALVE REPLACEMENT    . CATARACT EXTRACTION, BILATERAL Bilateral   . CORONARY ARTERY BYPASS GRAFT  2011   SVG-OM  . FRACTURE SURGERY    . MOHS SURGERY     upper lip  . NASAL SINUS SURGERY    . TONSILLECTOMY    . WRIST FRACTURE SURGERY Left      reports that she has never smoked. She has never used smokeless tobacco. She reports that she does not drink alcohol or use drugs.  Allergies  Allergen Reactions  . Aceon [Perindopril] Other (See Comments)    Chest Pain  . Beta Adrenergic Blockers Other (See Comments)    Serious cough both times she tried  . Hydralazine Hcl Cough  . Actonel [Risedronate Sodium] Other (See Comments)    Unknown  . Allegra [Fexofenadine] Other (See Comments)    BACK PAIN  . Biaxin [Clarithromycin] Nausea Only  . Ciprofloxacin Nausea Only  . Crestor [Rosuvastatin] Itching  . Fosamax [Alendronate Sodium] Other (See Comments)    Muscle pain  . Lipitor [Atorvastatin] Itching  . Promethazine Hcl Other (See Comments)    Unknown  . Simvastatin Rash  . Sulfa Antibiotics Other (See Comments)    GI Upset  . Welchol [Colesevelam Hcl] Rash    Family History  Problem Relation Age of Onset  .  Coronary artery disease Mother   . Coronary artery disease Father   . Hyperlipidemia Daughter     Prior to Admission medications   Medication Sig Start Date End Date Taking? Authorizing Provider  acetaminophen (TYLENOL) 325 MG tablet Take 325 mg by mouth every 6 (six) hours as needed (for pain).   Yes [provider]  calcium citrate-vitamin D (CALCIUM CITRATE +) 315-200 MG-UNIT per tablet Take 1 tablet by mouth 2 (two) times daily.   Yes [provider]  doxazosin (CARDURA) 2 MG tablet Take 1.5 tablets (3 mg total) by mouth 2 (two) times daily. 11/13/16  Yes Bensimhon, Shaune Pascal, MD  furosemide (LASIX) 80 MG tablet Take 1 tablet (80 mg total) by mouth 2 (two)  times daily. 11/13/16  Yes Bensimhon, Shaune Pascal, MD  ketotifen (THERA TEARS ALLERGY) 0.025 % ophthalmic solution Place 1 drop into both eyes 3 (three) times daily as needed (for dry eyes).    Yes [provider]  LORazepam (ATIVAN) 0.5 MG tablet Take 0.5 mg by mouth at bedtime.    Yes [provider]  magnesium hydroxide (MILK OF MAGNESIA) 400 MG/5ML suspension Take 15 mLs by mouth at bedtime.   Yes [provider]  Multiple Vitamins-Minerals (CENTRUM SILVER ULTRA WOMENS PO) Take 1 tablet by mouth daily.   Yes [provider]  Multiple Vitamins-Minerals (PRESERVISION AREDS) TABS Take 1 tablet by mouth 2 (two) times daily.    Yes [provider]  NIFEdipine (PROCARDIA-XL/ADALAT CC) 60 MG 24 hr tablet Take 60 mg by mouth 2 (two) times daily.   Yes [provider]  potassium chloride SA (K-DUR,KLOR-CON) 20 MEQ tablet TAKE 1 TABLET TWICE DAILY. Patient taking differently: Take 20 mEq by mouth two times a day 06/25/15  Yes Larey Dresser, MD  pravastatin (PRAVACHOL) 40 MG tablet TAKE 1 TABLET ONCE DAILY IN THE EVENING. Patient taking differently: Take 40 mg by mouth in the evening 02/01/17  Yes Bensimhon, Shaune Pascal, MD    Physical Exam: Vitals:   02/16/17 1945 02/16/17 2045 02/16/17 2245 02/16/17 2300  BP: (!) 190/74 (!) 181/67 (!) 186/74 (!) 204/74  Pulse: 82 81 81 88  Resp: 18 (!) 27 (!) 26 (!) 32  SpO2: 97% 91% 93% 93%  Weight:    52.3 kg (115 lb 3.2 oz)  Height:    5\' 1"  (1.549 m)      Constitutional: Moderately built and nourished. Vitals:   02/16/17 1945 02/16/17 2045 02/16/17 2245 02/16/17 2300  BP: (!) 190/74 (!) 181/67 (!) 186/74 (!) 204/74  Pulse: 82 81 81 88  Resp: 18 (!) 27 (!) 26 (!) 32  SpO2: 97% 91% 93% 93%  Weight:    52.3 kg (115 lb 3.2 oz)  Height:    5\' 1"  (1.549 m)   Eyes: Anicteric no pallor. ENMT: No discharge from the ears eyes nose or mouth. Neck: JVD is elevated no mass felt. Respiratory: No rhonchi or  crepitations. Cardiovascular: S1-S2 no murmurs appreciated. Abdomen: Soft nontender bowel sounds present. Musculoskeletal: No edema. No joint effusion. Skin: No rash. Skin appears warm. Neurologic: Alert awake oriented to time place and person. Moves all extremities. Psychiatric: Appears normal. Normal affect.   Labs on Admission: I have personally reviewed following labs and imaging studies  CBC:  Recent Labs Lab 02/16/17 1704  WBC 8.2  NEUTROABS 5.8  HGB 10.1*  HCT 31.7*  MCV 88.1  PLT 202   Basic Metabolic Panel:  Recent Labs Lab 02/16/17 1704  NA 137  K 4.7  CL 97*  CO2 27  GLUCOSE 99  BUN 71*  CREATININE 3.39*  CALCIUM 9.8   GFR: Estimated Creatinine Clearance: 7.7 mL/min (A) (by C-G formula based on SCr of 3.39 mg/dL (H)). Liver Function Tests:  Recent Labs Lab 02/16/17 1704  AST 24  ALT 18  ALKPHOS 38  BILITOT 0.6  PROT 7.0  ALBUMIN 3.8   No results for input(s): LIPASE, AMYLASE in the last 168 hours. No results for input(s): AMMONIA in the last 168 hours. Coagulation Profile:  Recent Labs Lab 02/16/17 1704  INR 1.07   Cardiac Enzymes: No results for input(s): CKTOTAL, CKMB, CKMBINDEX, TROPONINI in the last 168 hours. BNP (last 3 results) No results for input(s): PROBNP in the last 8760 hours. HbA1C: No results for input(s): HGBA1C in the last 72 hours. CBG: No results for input(s): GLUCAP in the last 168 hours. Lipid Profile: No results for input(s): CHOL, HDL, LDLCALC, TRIG, CHOLHDL, LDLDIRECT in the last 72 hours. Thyroid Function Tests: No results for input(s): TSH, T4TOTAL, FREET4, T3FREE, THYROIDAB in the last 72 hours. Anemia Panel: No results for input(s): VITAMINB12, FOLATE, FERRITIN, TIBC, IRON, RETICCTPCT in the last 72 hours. Urine analysis:    Component Value Date/Time   COLORURINE YELLOW 12/03/2009 0312   APPEARANCEUR CLEAR 12/03/2009 0312   LABSPEC 1.007 12/03/2009 0312   PHURINE 6.0 12/03/2009 0312   GLUCOSEU  NEGATIVE 12/03/2009 0312   HGBUR NEGATIVE 12/03/2009 0312   BILIRUBINUR NEGATIVE 12/03/2009 0312   KETONESUR NEGATIVE 12/03/2009 0312   PROTEINUR 30 (A) 12/03/2009 0312   UROBILINOGEN 0.2 12/03/2009 0312   NITRITE NEGATIVE 12/03/2009 0312   LEUKOCYTESUR NEGATIVE 12/03/2009 0312   Sepsis Labs: @LABRCNTIP (procalcitonin:4,lacticidven:4) )No results found for this or any previous visit (from the past 240 hour(s)).   Radiological Exams on Admission: Dg Chest Portable 1 View  Result Date: 02/16/2017 CLINICAL DATA:  Dry cough and short of breath EXAM: PORTABLE CHEST 1 VIEW COMPARISON:  10/02/2015 FINDINGS: Post sternotomy changes and valve prosthesis. Widened right AC joint as before with possible postsurgical changes of distal right clavicle. Hyperinflation with coarse interstitial opacities some of which are likely chronic. Streaky atelectasis or infiltrate at the left base. Trace pleural effusions. Cardiomegaly. IMPRESSION: 1. Streaky atelectasis or mild infiltrate at the left base with trace effusions 2. Cardiomegaly 3. Diffuse coarse interstitial opacities, likely chronic. Electronically Signed   By: Donavan Foil M.D.   On: 02/16/2017 18:17    EKG: Independently reviewed. Normal sinus rhythm and nonspecific ST changes.  Assessment/Plan Active Problems:   CKD (chronic kidney disease) stage 4, GFR 15-29 ml/min (HCC)   CAD (coronary artery disease) of artery bypass graft   S/P AVR   Essential hypertension   Acute respiratory failure with hypoxia (HCC)   Acute on chronic diastolic CHF (congestive heart failure) ([place])    1. Acute respiratory failure secondary to acute on chronic diastolic CHF last EF measured in December 2016 was 55-60% - patient usually takes 80 mg Lasix twice daily. Patient received 40 of IV Lasix in the ER and I am placing patient on 80 mg IV every 12. Closely follow intake and output metabolic panel and daily weights. 2. Uncontrolled hypertension - probably contributing  to #1. In addition to patient's Procardia and Cardura I will place patient on nitroglycerin patch. Closely follow blood pressure trends. Patient is allergic to hydralazine. 3. Acute on chronic kidney disease stage IV with worsening renal function - patient's creatinine has worsened from previous.  Since patient is short of breath and is on CHF will continue with IV Lasix and closely monitor creatinine. Since patient is making urine closely for output and may not need dialysis at this time. 4. CAD status post CABG and bioprosthetic aortic valve replacement - denies any chest pain. Cycle cardiac markers. Follow 2-D echo. 5. Chronic anemia probably from chronic kidney disease - hemoglobin appears to be at baseline.  Patient's chest x-ray shows possible infiltrates. Patient's symptoms are more consistent with CHF.   DVT prophylaxis: Heparin. Code Status: DO NOT RESUSCITATE.  Family Communication: Patient's daughter.  Disposition Plan: Back to facility.  Consults called: None.  Admission status: Inpatient.    Rise Patience MD [facility] Pager [phone].  If 7PM-7AM, please contact night-coverage [email or web address] Password [facility]  02/16/2017, 11:42 PM

## 2017-02-16 NOTE — ED Notes (Signed)
Pt assisted with bed pan

## 2017-02-17 DIAGNOSIS — I509 Heart failure, unspecified: Secondary | ICD-10-CM

## 2017-02-17 LAB — BASIC METABOLIC PANEL
Anion gap: 12 (ref 5–15)
BUN: 65 mg/dL — AB (ref 6–20)
CO2: 28 mmol/L (ref 22–32)
CREATININE: 3.01 mg/dL — AB (ref 0.44–1.00)
Calcium: 10.1 mg/dL (ref 8.9–10.3)
Chloride: 97 mmol/L — ABNORMAL LOW (ref 101–111)
GFR calc Af Amer: 14 mL/min — ABNORMAL LOW (ref >60)
GFR, EST NON AFRICAN AMERICAN: 12 mL/min — AB (ref >60)
GLUCOSE: 149 mg/dL — AB (ref 65–99)
Potassium: 4.1 mmol/L (ref 3.5–5.1)
Sodium: 137 mmol/L (ref 135–145)

## 2017-02-17 LAB — TROPONIN I
TROPONIN I: 0.05 ng/mL — AB (ref <0.03)
TROPONIN I: 0.05 ng/mL — AB (ref <0.03)
TROPONIN I: 0.06 ng/mL — AB (ref <0.03)

## 2017-02-17 LAB — CBC
HEMATOCRIT: 30.1 % — AB (ref 36.0–46.0)
Hemoglobin: 9.6 g/dL — ABNORMAL LOW (ref 12.0–15.0)
MCH: 28.1 pg (ref 26.0–34.0)
MCHC: 31.9 g/dL (ref 30.0–36.0)
MCV: 88 fL (ref 78.0–100.0)
Platelets: 191 10*3/uL (ref 150–400)
RBC: 3.42 MIL/uL — ABNORMAL LOW (ref 3.87–5.11)
RDW: 14.9 % (ref 11.5–15.5)
WBC: 5.1 10*3/uL (ref 4.0–10.5)

## 2017-02-17 LAB — MAGNESIUM: Magnesium: 3.8 mg/dL — ABNORMAL HIGH (ref 1.7–2.4)

## 2017-02-17 LAB — TSH: TSH: 1.057 u[IU]/mL (ref 0.350–4.500)

## 2017-02-17 LAB — MRSA PCR SCREENING: MRSA by PCR: NEGATIVE

## 2017-02-17 MED ORDER — SALINE SPRAY 0.65 % NA SOLN
1.0000 | NASAL | Status: DC | PRN
  Administered 2017-02-17: 1 via NASAL
  Filled 2017-02-17 (×2): qty 44

## 2017-02-17 MED ORDER — NITROGLYCERIN 0.4 MG/HR TD PT24
0.4000 mg | MEDICATED_PATCH | Freq: Every day | TRANSDERMAL | Status: DC
  Administered 2017-02-17 – 2017-02-21 (×5): 0.4 mg via TRANSDERMAL
  Filled 2017-02-17 (×5): qty 1

## 2017-02-17 NOTE — Progress Notes (Signed)
PROGRESS NOTE    Victoria Banks  [id number]:[id number] DOB: 02/24/1921 DOA: 02/16/2017 PCP: Lajean Manes, MD    Brief Narrative:  83 y.o. female with history of diastolic CHF, chronic kidney disease, bioprosthetic aortic valve replacement, chronic anemia, CAD status post CABG presents to the ER with complaints of shortness of breath. Patient states over the last few days patient has been getting progressive short of breath particularly on exertion  Assessment & Plan:   Active Problems:   Acute on chronic diastolic CHF (congestive heart failure) (HCC) - Pt is currently on lasix 80 mg IV q 12 hours, improving on this regimen.  CKD (chronic kidney disease) stage 4, GFR 15-29 ml/min (HCC) - Continue monitoring serum creatinine    CAD (coronary artery disease) of artery bypass graft - continue statin. No chest pain reported.    S/P AVR   Essential hypertension - Pt on nifedipine    Acute respiratory failure with hypoxia (HCC)  - continue supplemental oxygen. Patient improving on Lasix  DVT prophylaxis: Heparin Code Status: DNR Family Communication: None at bedside Disposition Plan: Pending improvement in respiratory condition back to baseline   Consultants:   None   Procedures: (None   Antimicrobials: None   Subjective: Pt has no new complaints, no acute issues overnight. Reports improvement in respiratory condition but not back to baseline  Objective: Vitals:   02/16/17 2300 02/17/17 0420 02/17/17 0808 02/17/17 1548  BP: (!) 204/74 (!) 189/73 (!) 173/71 (!) 164/78  Pulse: 88 72 74 82  Resp: (!) 32 (!) 28 (!) 22 (!) 25  Temp: 97.9 F (36.6 C) 97.8 F (36.6 C) 98.7 F (37.1 C) 97.6 F (36.4 C)  TempSrc: Oral Oral Oral Oral  SpO2: 93% 92% 93% 95%  Weight: 52.3 kg (115 lb 3.2 oz) 52.3 kg (115 lb 3.2 oz)    Height: 5\' 1"  (1.549 m)       Intake/Output Summary (Last 24 hours) at 02/17/17 1756 Last data filed at 02/17/17 1622  Gross per 24 hour  Intake               600 ml  Output             2350 ml  Net            -1750 ml   Filed Weights   02/16/17 1622 02/16/17 2300 02/17/17 0420  Weight: 54.4 kg (120 lb) 52.3 kg (115 lb 3.2 oz) 52.3 kg (115 lb 3.2 oz)    Examination:  General exam: Appears calm and comfortable, in nad. Respiratory system: no wheezes, rhales BL at bases Cardiovascular system: S1 & S2 heard, RRR.  Gastrointestinal system: Abdomen is nondistended, soft and nontender. No organomegaly or masses felt. Normal bowel sounds heard. Central nervous system: Alert and oriented. No focal neurological deficits. Extremities: Symmetric 5 x 5 power. Skin:  lesions or ulcers on limited exam. Psychiatry: Mood & affect appropriate.     Data Reviewed: I have personally reviewed following labs and imaging studies  CBC:  Recent Labs Lab 02/16/17 1704 02/17/17 0506  WBC 8.2 5.1  NEUTROABS 5.8  --   HGB 10.1* 9.6*  HCT 31.7* 30.1*  MCV 88.1 88.0  PLT 210 366   Basic Metabolic Panel:  Recent Labs Lab 02/16/17 1704 02/16/17 2354 02/17/17 0506  NA 137  --  137  K 4.7  --  4.1  CL 97*  --  97*  CO2 27  --  28  GLUCOSE 99  --  149*  BUN 71*  --  65*  CREATININE 3.39*  --  3.01*  CALCIUM 9.8  --  10.1  MG  --  3.8*  --    GFR: Estimated Creatinine Clearance: 8.6 mL/min (A) (by C-G formula based on SCr of 3.01 mg/dL (H)). Liver Function Tests:  Recent Labs Lab 02/16/17 1704  AST 24  ALT 18  ALKPHOS 38  BILITOT 0.6  PROT 7.0  ALBUMIN 3.8   No results for input(s): LIPASE, AMYLASE in the last 168 hours. No results for input(s): AMMONIA in the last 168 hours. Coagulation Profile:  Recent Labs Lab 02/16/17 1704  INR 1.07   Cardiac Enzymes:  Recent Labs Lab 02/16/17 2354 02/17/17 0506 02/17/17 1102  TROPONINI 0.05* 0.05* 0.06*   BNP (last 3 results) No results for input(s): PROBNP in the last 8760 hours. HbA1C: No results for input(s): HGBA1C in the last 72 hours. CBG: No results for input(s): GLUCAP in  the last 168 hours. Lipid Profile: No results for input(s): CHOL, HDL, LDLCALC, TRIG, CHOLHDL, LDLDIRECT in the last 72 hours. Thyroid Function Tests:  Recent Labs  02/16/17 2354  TSH 1.057   Anemia Panel: No results for input(s): VITAMINB12, FOLATE, FERRITIN, TIBC, IRON, RETICCTPCT in the last 72 hours. Sepsis Labs:  Recent Labs Lab 02/16/17 1723 02/16/17 2032  LATICACIDVEN 0.76 0.78    Recent Results (from the past 240 hour(s))  MRSA PCR Screening     Status: None   Collection Time: 02/17/17 12:48 AM  Result Value Ref Range Status   MRSA by PCR NEGATIVE NEGATIVE Final    Comment:        The GeneXpert MRSA Assay (FDA approved for NASAL specimens only), is one component of a comprehensive MRSA colonization surveillance program. It is not intended to diagnose MRSA infection nor to guide or monitor treatment for MRSA infections.          Radiology Studies: Dg Chest Portable 1 View  Result Date: 02/16/2017 CLINICAL DATA:  Dry cough and short of breath EXAM: PORTABLE CHEST 1 VIEW COMPARISON:  10/02/2015 FINDINGS: Post sternotomy changes and valve prosthesis. Widened right AC joint as before with possible postsurgical changes of distal right clavicle. Hyperinflation with coarse interstitial opacities some of which are likely chronic. Streaky atelectasis or infiltrate at the left base. Trace pleural effusions. Cardiomegaly. IMPRESSION: 1. Streaky atelectasis or mild infiltrate at the left base with trace effusions 2. Cardiomegaly 3. Diffuse coarse interstitial opacities, likely chronic. Electronically Signed   By: Donavan Foil M.D.   On: 02/16/2017 18:17        Scheduled Meds: . calcium-vitamin D  1 tablet Oral BID  . doxazosin  3 mg Oral BID  . furosemide  80 mg Intravenous Q12H  . heparin  5,000 Units Subcutaneous Q8H  . LORazepam  0.5 mg Oral QHS  . multivitamin  1 tablet Oral BID  . NIFEdipine  60 mg Oral BID  . nitroGLYCERIN  0.4 mg Transdermal Daily  .  potassium chloride SA  20 mEq Oral BID  . pravastatin  40 mg Oral q1800   Continuous Infusions:   LOS: 1 day    Time spent: > 35 minutes  Velvet Bathe, MD [facility] Pager [phone]  If 7PM-7AM, please contact night-coverage [email or web address] Password [facility] 02/17/2017, 5:56 PM

## 2017-02-18 ENCOUNTER — Inpatient Hospital Stay (HOSPITAL_COMMUNITY): Payer: Medicare Other

## 2017-02-18 DIAGNOSIS — I340 Nonrheumatic mitral (valve) insufficiency: Secondary | ICD-10-CM

## 2017-02-18 LAB — ECHOCARDIOGRAM COMPLETE
HEIGHTINCHES: 61 in
Weight: 1840 oz

## 2017-02-18 LAB — BASIC METABOLIC PANEL
Anion gap: 11 (ref 5–15)
BUN: 75 mg/dL — AB (ref 6–20)
CO2: 29 mmol/L (ref 22–32)
Calcium: 9.8 mg/dL (ref 8.9–10.3)
Chloride: 95 mmol/L — ABNORMAL LOW (ref 101–111)
Creatinine, Ser: 3.41 mg/dL — ABNORMAL HIGH (ref 0.44–1.00)
GFR calc Af Amer: 12 mL/min — ABNORMAL LOW (ref >60)
GFR, EST NON AFRICAN AMERICAN: 11 mL/min — AB (ref >60)
GLUCOSE: 144 mg/dL — AB (ref 65–99)
POTASSIUM: 4.3 mmol/L (ref 3.5–5.1)
Sodium: 135 mmol/L (ref 135–145)

## 2017-02-18 MED ORDER — MELATONIN 3 MG PO TABS
6.0000 mg | ORAL_TABLET | Freq: Every day | ORAL | Status: DC
  Administered 2017-02-18 – 2017-02-20 (×3): 6 mg via ORAL
  Filled 2017-02-18 (×5): qty 2

## 2017-02-18 NOTE — Plan of Care (Signed)
Problem: Safety: Goal: Ability to remain free from injury will improve Outcome: Progressing Patient totally alert and oriented, explained that she needs to call for assistance or use the white phone to call RN/NT and showed her where number was, also made aware that her bed alarm would be placed on just in case she forgot where she was after she went to sleep and she was okay with that.

## 2017-02-18 NOTE — Plan of Care (Signed)
Problem: Pain Managment: Goal: General experience of comfort will improve Outcome: Progressing Patient has no pain at this time, will continue to monitor

## 2017-02-18 NOTE — Progress Notes (Signed)
PROGRESS NOTE    Victoria Banks  [id number] DOB: [birth year] DOA: 02/16/2017 PCP: Lajean Manes, MD    Brief Narrative:  83 y.o. female with history of diastolic CHF, chronic kidney disease, bioprosthetic aortic valve replacement, chronic anemia, CAD status post CABG presents to the ER with complaints of shortness of breath. Patient states over the last few days patient has been getting progressive short of breath particularly on exertion  Assessment & Plan:   Active Problems:   Acute on chronic diastolic CHF (congestive heart failure) (HCC) - Pt is currently on lasix 80 mg IV q 12 hours, improving on this regimen. Will continue  CKD (chronic kidney disease) stage 4, GFR 15-29 ml/min (HCC) - Continue monitoring serum creatinine    CAD (coronary artery disease) of artery bypass graft - continue statin. No chest pain reported.    S/P AVR   Essential hypertension - Pt on nifedipine    Acute respiratory failure with hypoxia (HCC)  - continue supplemental oxygen. Patient improving on Lasix - will wean to room air.  DVT prophylaxis: Heparin Code Status: DNR Family Communication: None at bedside Disposition Plan: Pending improvement in respiratory condition back to baseline  Consultants:   None   Procedures: (None)   Antimicrobials: None  Subjective: No new complaints reported, patient reports breathing better.  Objective: Vitals:   02/17/17 2340 02/18/17 0417 02/18/17 0814 02/18/17 1143  BP: (!) 145/55 (!) 147/57 (!) 162/75 (!) 169/69  Pulse: 71 80 85 76  Resp: (!) 22 (!) 26 (!) 23 (!) 24  Temp: 98.4 F (36.9 C) 98 F (36.7 C) 98.5 F (36.9 C) 97.9 F (36.6 C)  TempSrc: Oral Oral Oral Oral  SpO2: 94% 95% 95% 98%  Weight:  52.2 kg (115 lb)    Height:        Intake/Output Summary (Last 24 hours) at 02/18/17 1541 Last data filed at 02/18/17 1436  Gross per 24 hour  Intake              720 ml  Output             1350 ml  Net             -630 ml    Filed Weights   02/16/17 2300 02/17/17 0420 02/18/17 0417  Weight: 52.3 kg (115 lb 3.2 oz) 52.3 kg (115 lb 3.2 oz) 52.2 kg (115 lb)    Examination:  General exam: Appears calm and comfortable, in nad. Respiratory system: no wheezes, rhales BL at bases Cardiovascular system: S1 & S2 heard, RRR.  Gastrointestinal system: Abdomen is nondistended, soft and nontender. No organomegaly or masses felt. Normal bowel sounds heard. Central nervous system: Alert and oriented. No focal neurological deficits. Extremities: Symmetric 5 x 5 power. Skin:  lesions or ulcers on limited exam. Psychiatry: Mood & affect appropriate.   Data Reviewed: I have personally reviewed following labs and imaging studies  CBC:  Recent Labs Lab 02/16/17 1704 02/17/17 0506  WBC 8.2 5.1  NEUTROABS 5.8  --   HGB 10.1* 9.6*  HCT 31.7* 30.1*  MCV 88.1 88.0  PLT 210 017   Basic Metabolic Panel:  Recent Labs Lab 02/16/17 1704 02/16/17 2354 02/17/17 0506 02/18/17 0417  NA 137  --  137 135  K 4.7  --  4.1 4.3  CL 97*  --  97* 95*  CO2 27  --  28 29  GLUCOSE 99  --  149* 144*  BUN 71*  --  65*  75*  CREATININE 3.39*  --  3.01* 3.41*  CALCIUM 9.8  --  10.1 9.8  MG  --  3.8*  --   --    GFR: Estimated Creatinine Clearance: 7.6 mL/min (A) (by C-G formula based on SCr of 3.41 mg/dL (H)). Liver Function Tests:  Recent Labs Lab 02/16/17 1704  AST 24  ALT 18  ALKPHOS 38  BILITOT 0.6  PROT 7.0  ALBUMIN 3.8   No results for input(s): LIPASE, AMYLASE in the last 168 hours. No results for input(s): AMMONIA in the last 168 hours. Coagulation Profile:  Recent Labs Lab 02/16/17 1704  INR 1.07   Cardiac Enzymes:  Recent Labs Lab 02/16/17 2354 02/17/17 0506 02/17/17 1102  TROPONINI 0.05* 0.05* 0.06*   BNP (last 3 results) No results for input(s): PROBNP in the last 8760 hours. HbA1C: No results for input(s): HGBA1C in the last 72 hours. CBG: No results for input(s): GLUCAP in the last 168  hours. Lipid Profile: No results for input(s): CHOL, HDL, LDLCALC, TRIG, CHOLHDL, LDLDIRECT in the last 72 hours. Thyroid Function Tests:  Recent Labs  02/16/17 2354  TSH 1.057   Anemia Panel: No results for input(s): VITAMINB12, FOLATE, FERRITIN, TIBC, IRON, RETICCTPCT in the last 72 hours. Sepsis Labs:  Recent Labs Lab 02/16/17 1723 02/16/17 2032  LATICACIDVEN 0.76 0.78    Recent Results (from the past 240 hour(s))  MRSA PCR Screening     Status: None   Collection Time: 02/17/17 12:48 AM  Result Value Ref Range Status   MRSA by PCR NEGATIVE NEGATIVE Final    Comment:        The GeneXpert MRSA Assay (FDA approved for NASAL specimens only), is one component of a comprehensive MRSA colonization surveillance program. It is not intended to diagnose MRSA infection nor to guide or monitor treatment for MRSA infections.          Radiology Studies: Dg Chest Portable 1 View  Result Date: 02/16/2017 CLINICAL DATA:  Dry cough and short of breath EXAM: PORTABLE CHEST 1 VIEW COMPARISON:  10/02/2015 FINDINGS: Post sternotomy changes and valve prosthesis. Widened right AC joint as before with possible postsurgical changes of distal right clavicle. Hyperinflation with coarse interstitial opacities some of which are likely chronic. Streaky atelectasis or infiltrate at the left base. Trace pleural effusions. Cardiomegaly. IMPRESSION: 1. Streaky atelectasis or mild infiltrate at the left base with trace effusions 2. Cardiomegaly 3. Diffuse coarse interstitial opacities, likely chronic. Electronically Signed   By: Donavan Foil M.D.   On: 02/16/2017 18:17      Scheduled Meds: . calcium-vitamin D  1 tablet Oral BID  . doxazosin  3 mg Oral BID  . furosemide  80 mg Intravenous Q12H  . heparin  5,000 Units Subcutaneous Q8H  . LORazepam  0.5 mg Oral QHS  . multivitamin  1 tablet Oral BID  . NIFEdipine  60 mg Oral BID  . nitroGLYCERIN  0.4 mg Transdermal Daily  . potassium chloride  SA  20 mEq Oral BID  . pravastatin  40 mg Oral q1800   Continuous Infusions:   LOS: 2 days    Time spent: > 35 minutes  Velvet Bathe, MD [facility] Pager [phone]  If 7PM-7AM, please contact night-coverage [email or web address] Password [facility] 02/18/2017, 3:41 PM

## 2017-02-18 NOTE — Progress Notes (Signed)
Assisted pt to get OOB to bedside commode, procedure well tolerated.

## 2017-02-18 NOTE — Progress Notes (Signed)
  Echocardiogram 2D Echocardiogram has been performed.  Victoria Banks [name] 02/18/2017, 1:29 PM

## 2017-02-18 NOTE — Progress Notes (Signed)
Report received in room via [place], reviewed VS, med, labs and patient's general condition, assumed care of patient.

## 2017-02-19 LAB — BASIC METABOLIC PANEL
ANION GAP: 10 (ref 5–15)
ANION GAP: 12 (ref 5–15)
BUN: 79 mg/dL — ABNORMAL HIGH (ref 6–20)
BUN: 77 mg/dL — ABNORMAL HIGH (ref 6–20)
CALCIUM: 9.6 mg/dL (ref 8.9–10.3)
CALCIUM: 10.2 mg/dL (ref 8.9–10.3)
CHLORIDE: 96 mmol/L — AB (ref 101–111)
CO2: 28 mmol/L (ref 22–32)
CO2: 27 mmol/L (ref 22–32)
Chloride: 95 mmol/L — ABNORMAL LOW (ref 101–111)
Creatinine, Ser: 3.35 mg/dL — ABNORMAL HIGH (ref 0.44–1.00)
Creatinine, Ser: 3.46 mg/dL — ABNORMAL HIGH (ref 0.44–1.00)
GFR calc non Af Amer: 11 mL/min — ABNORMAL LOW (ref >60)
GFR calc non Af Amer: 10 mL/min — ABNORMAL LOW (ref >60)
GFR, EST AFRICAN AMERICAN: 13 mL/min — AB (ref >60)
GFR, EST AFRICAN AMERICAN: 12 mL/min — AB (ref >60)
Glucose, Bld: 126 mg/dL — ABNORMAL HIGH (ref 65–99)
Glucose, Bld: 178 mg/dL — ABNORMAL HIGH (ref 65–99)
Potassium: 3.9 mmol/L (ref 3.5–5.1)
Potassium: 3.1 mmol/L — ABNORMAL LOW (ref 3.5–5.1)
SODIUM: 134 mmol/L — AB (ref 135–145)
SODIUM: 134 mmol/L — AB (ref 135–145)

## 2017-02-19 MED ORDER — POTASSIUM CHLORIDE CRYS ER 20 MEQ PO TBCR
40.0000 meq | EXTENDED_RELEASE_TABLET | Freq: Once | ORAL | Status: AC
  Administered 2017-02-19: 40 meq via ORAL
  Filled 2017-02-19: qty 2

## 2017-02-19 NOTE — Care Management Important Message (Signed)
Important Message  Patient Details  Name: Victoria Banks MRN: [id number] Date of Birth: 01-Oct-1920   Medicare Important Message Given:  Yes    Haris Baack 02/19/2017, 1:21 PM

## 2017-02-19 NOTE — Plan of Care (Signed)
Problem: Skin Integrity: Goal: Risk for impaired skin integrity will decrease Outcome: Progressing Skin care done- no skin breakdown noted- barrier cream applied.

## 2017-02-19 NOTE — Plan of Care (Signed)
Problem: Pain Managment: Goal: General experience of comfort will improve Outcome: Progressing No c/o pain     

## 2017-02-19 NOTE — Progress Notes (Signed)
PROGRESS NOTE    Victoria Banks  FTD:[id number] DOB: [birth year] DOA: 02/16/2017 PCP: Lajean Manes, MD    Brief Narrative:  83 y.o. female with history of diastolic CHF, chronic kidney disease, bioprosthetic aortic valve replacement, chronic anemia, CAD status post CABG presents to the ER with complaints of shortness of breath. Patient states over the last few days patient has been getting progressive short of breath particularly on exertion  Assessment & Plan:   Active Problems:   Acute on chronic diastolic CHF (congestive heart failure) (HCC) - Pt is currently on lasix 80 mg IV q 12 hours, improving on this regimen. Will continue  CKD (chronic kidney disease) stage 4, GFR 15-29 ml/min (HCC) - Continue monitoring serum creatinine    CAD (coronary artery disease) of artery bypass graft - continue statin. No chest pain reported.  Hypokalemia - will add extra dose of K dur 40 meq today.    S/P AVR   Essential hypertension - Pt on nifedipine    Acute respiratory failure with hypoxia (HCC)  - continue supplemental oxygen. Patient improving on Lasix - will wean to room air.  DVT prophylaxis: Heparin Code Status: DNR Family Communication: None at bedside Disposition Plan: Pending improvement in respiratory condition back to baseline  Consultants:   None   Procedures: (None)   Antimicrobials: None  Subjective: No new complaints reported, patient reports breathing better.  Objective: Vitals:   02/19/17 0600 02/19/17 0700 02/19/17 1209 02/19/17 1539  BP:    (!) 147/49  Pulse: 69 70 72 74  Resp: (!) 21 18 18  (!) 23  Temp:  98.3 F (36.8 C) 98.1 F (36.7 C) 97.9 F (36.6 C)  TempSrc:  Oral Oral Oral  SpO2: 95% 97% 97% 95%  Weight:      Height:        Intake/Output Summary (Last 24 hours) at 02/19/17 1826 Last data filed at 02/19/17 1500  Gross per 24 hour  Intake              720 ml  Output              825 ml  Net             -105 ml   Filed Weights     02/17/17 0420 02/18/17 0417 02/19/17 0243  Weight: 52.3 kg (115 lb 3.2 oz) 52.2 kg (115 lb) 51.8 kg (114 lb 4.8 oz)    Examination:  General exam: Appears calm and comfortable, in nad. Respiratory system: no wheezes, rhales BL at bases Cardiovascular system: S1 & S2 heard, RRR.  Gastrointestinal system: Abdomen is nondistended, soft and nontender. No organomegaly or masses felt. Normal bowel sounds heard. Central nervous system: Alert and oriented. No focal neurological deficits. Extremities: Symmetric 5 x 5 power. Skin:  lesions or ulcers on limited exam. Psychiatry: Mood & affect appropriate.   Data Reviewed: I have personally reviewed following labs and imaging studies  CBC:  Recent Labs Lab 02/16/17 1704 02/17/17 0506  WBC 8.2 5.1  NEUTROABS 5.8  --   HGB 10.1* 9.6*  HCT 31.7* 30.1*  MCV 88.1 88.0  PLT 210 062   Basic Metabolic Panel:  Recent Labs Lab 02/16/17 1704 02/16/17 2354 02/17/17 0506 02/18/17 0417 02/18/17 2325 02/19/17 0835  NA 137  --  137 135 134* 134*  K 4.7  --  4.1 4.3 3.9 3.1*  CL 97*  --  97* 95* 96* 95*  CO2 27  --  28 29 28  27  GLUCOSE 99  --  149* 144* 126* 178*  BUN 71*  --  65* 75* 79* 77*  CREATININE 3.39*  --  3.01* 3.41* 3.35* 3.46*  CALCIUM 9.8  --  10.1 9.8 9.6 10.2  MG  --  3.8*  --   --   --   --    GFR: Estimated Creatinine Clearance: 7.5 mL/min (A) (by C-G formula based on SCr of 3.46 mg/dL (H)). Liver Function Tests:  Recent Labs Lab 02/16/17 1704  AST 24  ALT 18  ALKPHOS 38  BILITOT 0.6  PROT 7.0  ALBUMIN 3.8   No results for input(s): LIPASE, AMYLASE in the last 168 hours. No results for input(s): AMMONIA in the last 168 hours. Coagulation Profile:  Recent Labs Lab 02/16/17 1704  INR 1.07   Cardiac Enzymes:  Recent Labs Lab 02/16/17 2354 02/17/17 0506 02/17/17 1102  TROPONINI 0.05* 0.05* 0.06*   BNP (last 3 results) No results for input(s): PROBNP in the last 8760 hours. HbA1C: No results for  input(s): HGBA1C in the last 72 hours. CBG: No results for input(s): GLUCAP in the last 168 hours. Lipid Profile: No results for input(s): CHOL, HDL, LDLCALC, TRIG, CHOLHDL, LDLDIRECT in the last 72 hours. Thyroid Function Tests:  Recent Labs  02/16/17 2354  TSH 1.057   Anemia Panel: No results for input(s): VITAMINB12, FOLATE, FERRITIN, TIBC, IRON, RETICCTPCT in the last 72 hours. Sepsis Labs:  Recent Labs Lab 02/16/17 1723 02/16/17 2032  LATICACIDVEN 0.76 0.78    Recent Results (from the past 240 hour(s))  MRSA PCR Screening     Status: None   Collection Time: 02/17/17 12:48 AM  Result Value Ref Range Status   MRSA by PCR NEGATIVE NEGATIVE Final    Comment:        The GeneXpert MRSA Assay (FDA approved for NASAL specimens only), is one component of a comprehensive MRSA colonization surveillance program. It is not intended to diagnose MRSA infection nor to guide or monitor treatment for MRSA infections.       Radiology Studies: No results found.    Scheduled Meds: . calcium-vitamin D  1 tablet Oral BID  . doxazosin  3 mg Oral BID  . furosemide  80 mg Intravenous Q12H  . heparin  5,000 Units Subcutaneous Q8H  . LORazepam  0.5 mg Oral QHS  . Melatonin  6 mg Oral QHS  . multivitamin  1 tablet Oral BID  . NIFEdipine  60 mg Oral BID  . nitroGLYCERIN  0.4 mg Transdermal Daily  . potassium chloride SA  20 mEq Oral BID  . pravastatin  40 mg Oral q1800   Continuous Infusions:   LOS: 3 days    Time spent: > 35 minutes  Velvet Bathe, MD [facility] Pager [phone]  If 7PM-7AM, please contact night-coverage [email or web address] Password [facility] 02/19/2017, 6:26 PM

## 2017-02-19 NOTE — Evaluation (Signed)
Physical Therapy Evaluation Patient Details Name: Victoria Banks MRN: [id number] DOB: [birth year] Today's Date: 02/19/2017   History of Present Illness  Patient is a 83 y/o female who presents with SOB. Found to be hypoxic and SOB in the ED and was initially placed on BiPAP. Admitted with Acute respiratory failure secondary to acute on chronic diastolic CHF. PMH includes HTN, high cholesterol, CKD, CAD, CHF, aortic stenosis.  Clinical Impression  Patient presents with drop in Sp02 with mobility on RA to 85% but quickly able to recover once 02 is donned. Trying to wean pt off supplemental 02, now down to 2L/min 02 from 5L. Tolerated gait training and ADls (toileting) with supervision for safety. Education re: energy conservation techniques and importance of mobility. Pt will continue to work with mobility tech to maintain strength, endurance and mobility while in hospital as well as continue to wean 02. Discussed with RN. Pt does not require skilled therapy services as pt functioning close to baseline. Discharge from therapy.    Follow Up Recommendations No PT follow up;Supervision - Intermittent    Equipment Recommendations  None recommended by PT    Recommendations for Other Services       Precautions / Restrictions Precautions Precautions: Fall Restrictions Weight Bearing Restrictions: No      Mobility  Bed Mobility               General bed mobility comments: Up in chair upon PT arrival.  Transfers Overall transfer level: Needs assistance Equipment used: Rolling walker (2 wheeled) Transfers: Sit to/from Stand Sit to Stand: Supervision         General transfer comment: Supervision for safety. Stood from chair x2, SPT chair to/from [facility] with supervision.  Ambulation/Gait Ambulation/Gait assistance: Supervision Ambulation Distance (Feet): 150 Feet Assistive device: Rolling walker (2 wheeled) Gait Pattern/deviations: Step-through pattern;Decreased stride length;Trunk  flexed Gait velocity: decreased   General Gait Details: Slow, steady gait with use of RW. Ambulated on RA and Sp02 dropped to 85%. Recovered quickly with 02 donned to >91%. Placed on 2L/min 02. 2/4 DOE.  Stairs            Wheelchair Mobility    Modified Rankin (Stroke Patients Only)       Balance Overall balance assessment: Needs assistance Sitting-balance support: Feet supported;No upper extremity supported Sitting balance-Leahy Scale: Good Sitting balance - Comments: Able to reach outside bOS and fix socks without LOB or difficulty.    Standing balance support: During functional activity;Single extremity supported Standing balance-Leahy Scale: Poor Standing balance comment: Reliant on 1 UE for support in standing. Able to perform pericare without difficulty.                              Pertinent Vitals/Pain Pain Assessment: No/denies pain    Home Living Family/patient expects to be discharged to:: Private residence (Independent living at [facility]) Living Arrangements: Alone Available Help at Discharge: Available PRN/intermittently Type of Home: Independent living facility Home Access: Elevator     Home Layout: One level Home Equipment: Shower seat;Other (comment) Additional Comments: 3 wheeled walker.    Prior Function Level of Independence: Independent with assistive device(s)         Comments: uses 3 wheeled walker; works out 3 days/week on nustep and bike. Goes to dining hall for one meal.     Hand Dominance   Dominant Hand: Right    Extremity/Trunk Assessment   Upper Extremity Assessment Upper  Extremity Assessment: Defer to OT evaluation    Lower Extremity Assessment Lower Extremity Assessment: Overall WFL for tasks assessed    Cervical / Trunk Assessment Cervical / Trunk Assessment: Kyphotic  Communication   Communication: HOH (hearing aids)  Cognition Arousal/Alertness: Awake/alert Behavior During Therapy: WFL for  tasks assessed/performed Overall Cognitive Status: Within Functional Limits for tasks assessed                                        General Comments General comments (skin integrity, edema, etc.): Sp02 ranged from 85-98% on RA and then donned 2L/min 02, Pt on 5L/min 02 prior to PT arrival. Weaning her down. RN notified.    Exercises     Assessment/Plan    PT Assessment Patent does not need any further PT services  PT Problem List Cardiopulmonary status limiting activity;Decreased balance       PT Treatment Interventions      PT Goals (Current goals can be found in the Care Plan section)  Acute Rehab PT Goals PT Goal Formulation: All assessment and education complete, DC therapy    Frequency     Barriers to discharge        Co-evaluation               AM-PAC PT "6 Clicks" Daily Activity  Outcome Measure Difficulty turning over in bed (including adjusting bedclothes, sheets and blankets)?: None Difficulty moving from lying on back to sitting on the side of the bed? : None Difficulty sitting down on and standing up from a chair with arms (e.g., wheelchair, bedside commode, etc,.)?: None Help needed moving to and from a bed to chair (including a wheelchair)?: None Help needed walking in hospital room?: None Help needed climbing 3-5 steps with a railing? : A Little 6 Click Score: 23    End of Session Equipment Utilized During Treatment: Gait belt;Oxygen Activity Tolerance: Patient tolerated treatment well Patient left: in chair;with call bell/phone within reach Nurse Communication: Mobility status PT Visit Diagnosis: Unsteadiness on feet (R26.81)    Time: 1320-1340 PT Time Calculation (min) (ACUTE ONLY): 20 min   Charges:   PT Evaluation $PT Eval Moderate Complexity: 1 Procedure     PT G Codes:        Wray Kearns, PT, DPT [phone]    Marguarite Arbour A Breckan Cafiero 02/19/2017, 1:52 PM

## 2017-02-19 NOTE — Care Management Note (Signed)
Case Management Note  Patient Details  Name: Victoria Banks MRN: [id number] Date of Birth: [birth year]  Subjective/Objective: Pt presented for Acute Respiratory Failure. Pt continues to receive IV Lasix for Diuresis. Pt is from [employer].                    Action/Plan: CM will continue to monitor for home 02 needs.   Expected Discharge Date:                  Expected Discharge Plan:  Home/Self Care  In-House Referral:  NA  Discharge planning Services  CM Consult  Post Acute Care Choice:  NA Choice offered to:     DME Arranged:    DME Agency:     HH Arranged:  NA HH Agency:  NA  Status of Service:  Completed, signed off  If discussed at [place] of Stay Meetings, dates discussed:    Additional Comments:  Bethena Roys, RN 02/19/2017, 3:53 PM

## 2017-02-20 DIAGNOSIS — I5033 Acute on chronic diastolic (congestive) heart failure: Secondary | ICD-10-CM

## 2017-02-20 DIAGNOSIS — I25810 Atherosclerosis of coronary artery bypass graft(s) without angina pectoris: Secondary | ICD-10-CM

## 2017-02-20 DIAGNOSIS — Z952 Presence of prosthetic heart valve: Secondary | ICD-10-CM

## 2017-02-20 DIAGNOSIS — I472 Ventricular tachycardia: Secondary | ICD-10-CM

## 2017-02-20 LAB — BASIC METABOLIC PANEL
ANION GAP: 13 (ref 5–15)
BUN: 80 mg/dL — AB (ref 6–20)
CALCIUM: 10.1 mg/dL (ref 8.9–10.3)
CO2: 27 mmol/L (ref 22–32)
Chloride: 95 mmol/L — ABNORMAL LOW (ref 101–111)
Creatinine, Ser: 3.36 mg/dL — ABNORMAL HIGH (ref 0.44–1.00)
GFR calc Af Amer: 13 mL/min — ABNORMAL LOW (ref >60)
GFR calc non Af Amer: 11 mL/min — ABNORMAL LOW (ref >60)
Glucose, Bld: 111 mg/dL — ABNORMAL HIGH (ref 65–99)
POTASSIUM: 4.4 mmol/L (ref 3.5–5.1)
SODIUM: 135 mmol/L (ref 135–145)

## 2017-02-20 LAB — MAGNESIUM: Magnesium: 2.4 mg/dL (ref 1.7–2.4)

## 2017-02-20 LAB — PHOSPHORUS: PHOSPHORUS: 5.5 mg/dL — AB (ref 2.5–4.6)

## 2017-02-20 NOTE — Progress Notes (Signed)
PROGRESS NOTE    Victoria Banks  PXT:[id number] DOB: 1920-11-23 DOA: 02/16/2017 PCP: Lajean Manes, MD    Brief Narrative:  83 y.o. female with history of diastolic CHF, chronic kidney disease, bioprosthetic aortic valve replacement, chronic anemia, CAD status post CABG presents to the ER with complaints of shortness of breath. Patient states over the last few days patient has been getting progressive short of breath particularly on exertion  Assessment & Plan:   Active Problems:   Acute on chronic diastolic CHF (congestive heart failure) (HCC) - Pt is currently on lasix 80 mg IV q 12 hours placed stop date for today. Start oral regimen next am. - Will notify HF team of patient's presence   CKD (chronic kidney disease) stage 4, GFR 15-29 ml/min (HCC) - Continue monitoring serum creatinine    CAD (coronary artery disease) of artery bypass graft - continue statin. No chest pain reported.  Hypokalemia - resolved    S/P AVR   Essential hypertension - Pt on nifedipine    Acute respiratory failure with hypoxia (HCC)  - continue supplemental oxygen. Patient improving on Lasix - will wean to room air.  DVT prophylaxis: Heparin Code Status: DNR Family Communication: None at bedside Disposition Plan: Pending improvement in respiratory condition back to baseline  Consultants:   Cardiology   Procedures: (None)   Antimicrobials: None  Subjective: Pt nearing baseline respiratory status  Objective: Vitals:   02/19/17 1954 02/20/17 0100 02/20/17 0443 02/20/17 0953  BP: (!) 152/59 129/69 (!) 156/58 (!) 142/64  Pulse: 77 85 84   Resp: (!) 23 (!) 30 (!) 25   Temp: 97.8 F (36.6 C) 98 F (36.7 C) 98.3 F (36.8 C)   TempSrc: Oral Oral Oral   SpO2: 95% 95% 94%   Weight:   54.7 kg (120 lb 8 oz)   Height:        Intake/Output Summary (Last 24 hours) at 02/20/17 1401 Last data filed at 02/20/17 0447  Gross per 24 hour  Intake              480 ml  Output              1000 ml  Net             -520 ml   Filed Weights   02/18/17 0417 02/19/17 0243 02/20/17 0443  Weight: 52.2 kg (115 lb) 51.8 kg (114 lb 4.8 oz) 54.7 kg (120 lb 8 oz)    Examination:  General exam: Appears calm and comfortable, in nad. Respiratory system: no wheezes, equal chest rise, [place] in place Cardiovascular system: S1 & S2 heard, RRR.  Gastrointestinal system: Abdomen is nondistended, soft and nontender. No organomegaly or masses felt. Normal bowel sounds heard. Central nervous system: Alert and oriented. No focal neurological deficits. Extremities: Symmetric 5 x 5 power. Skin:  lesions or ulcers on limited exam. Psychiatry: Mood & affect appropriate.   Data Reviewed: I have personally reviewed following labs and imaging studies  CBC:  Recent Labs Lab 02/16/17 1704 02/17/17 0506  WBC 8.2 5.1  NEUTROABS 5.8  --   HGB 10.1* 9.6*  HCT 31.7* 30.1*  MCV 88.1 88.0  PLT 210 627   Basic Metabolic Panel:  Recent Labs Lab 02/16/17 2354 02/17/17 0506 02/18/17 0417 02/18/17 2325 02/19/17 0835 02/20/17 0259  NA  --  137 135 134* 134* 135  K  --  4.1 4.3 3.9 3.1* 4.4  CL  --  97* 95* 96* 95* 95*  CO2  --  28 29 28 27 27   GLUCOSE  --  149* 144* 126* 178* 111*  BUN  --  65* 75* 79* 77* 80*  CREATININE  --  3.01* 3.41* 3.35* 3.46* 3.36*  CALCIUM  --  10.1 9.8 9.6 10.2 10.1  MG 3.8*  --   --   --   --  2.4  PHOS  --   --   --   --   --  5.5*   GFR: Estimated Creatinine Clearance: 7.7 mL/min (A) (by C-G formula based on SCr of 3.36 mg/dL (H)). Liver Function Tests:  Recent Labs Lab 02/16/17 1704  AST 24  ALT 18  ALKPHOS 38  BILITOT 0.6  PROT 7.0  ALBUMIN 3.8   No results for input(s): LIPASE, AMYLASE in the last 168 hours. No results for input(s): AMMONIA in the last 168 hours. Coagulation Profile:  Recent Labs Lab 02/16/17 1704  INR 1.07   Cardiac Enzymes:  Recent Labs Lab 02/16/17 2354 02/17/17 0506 02/17/17 1102  TROPONINI 0.05* 0.05* 0.06*    BNP (last 3 results) No results for input(s): PROBNP in the last 8760 hours. HbA1C: No results for input(s): HGBA1C in the last 72 hours. CBG: No results for input(s): GLUCAP in the last 168 hours. Lipid Profile: No results for input(s): CHOL, HDL, LDLCALC, TRIG, CHOLHDL, LDLDIRECT in the last 72 hours. Thyroid Function Tests: No results for input(s): TSH, T4TOTAL, FREET4, T3FREE, THYROIDAB in the last 72 hours. Anemia Panel: No results for input(s): VITAMINB12, FOLATE, FERRITIN, TIBC, IRON, RETICCTPCT in the last 72 hours. Sepsis Labs:  Recent Labs Lab 02/16/17 1723 02/16/17 2032  LATICACIDVEN 0.76 0.78    Recent Results (from the past 240 hour(s))  MRSA PCR Screening     Status: None   Collection Time: 02/17/17 12:48 AM  Result Value Ref Range Status   MRSA by PCR NEGATIVE NEGATIVE Final    Comment:        The GeneXpert MRSA Assay (FDA approved for NASAL specimens only), is one component of a comprehensive MRSA colonization surveillance program. It is not intended to diagnose MRSA infection nor to guide or monitor treatment for MRSA infections.       Radiology Studies: No results found.    Scheduled Meds: . calcium-vitamin D  1 tablet Oral BID  . doxazosin  3 mg Oral BID  . furosemide  80 mg Intravenous Q12H  . heparin  5,000 Units Subcutaneous Q8H  . LORazepam  0.5 mg Oral QHS  . Melatonin  6 mg Oral QHS  . multivitamin  1 tablet Oral BID  . NIFEdipine  60 mg Oral BID  . nitroGLYCERIN  0.4 mg Transdermal Daily  . potassium chloride SA  20 mEq Oral BID  . pravastatin  40 mg Oral q1800   Continuous Infusions:   LOS: 4 days    Time spent: > 35 minutes  Velvet Bathe, MD [facility] Pager [phone]  If 7PM-7AM, please contact night-coverage [email or web address] Password [email or web address] 02/20/2017, 2:01 PM

## 2017-02-20 NOTE — Progress Notes (Signed)
Pt had a  run of 11 V-tach, asymptomatic. MD notified.  Ferdinand Lango, RN

## 2017-02-20 NOTE — Consult Note (Addendum)
Cardiology Consultation:   Patient ID: Victoria Banks; [id number]; 07/02/1921   Admit date: 02/16/2017 Date of Consult: 02/20/2017  Primary Care Provider: Lajean Manes, MD Primary Cardiologist: Bensimhon Primary Electrophysiologist:  None   Patient Profile:   Victoria Banks is a 83 y.o. female with a hx of Bioprosthetic aortic valve, CAD, stage IV chronic kidney disease, chronic diastolic heart failure who is being seen today for the evaluation of nonsustained ventricular tachycardia at the request of Dr. Velvet Bathe.  History of Present Illness:   Victoria Banks is a 83 year old with history of chronic kidney disease, labile prosthetic aortic valve, chronic anemia, CAD with previous coronary bypass grafting who was admitted with shortness of breath. She was felt to be in acute on chronic diastolic heart failure. Echocardiogram has demonstrated normal systolic function and prosthetic valve function. On telemetry today she had an 11 beat run of wide complex tachycardia.  Past Medical History:  Diagnosis Date  . Allergic rhinitis   . Aortic stenosis, severe 08/2009   s/p AVR w pericardial tissue valve 10/2009  . Atelectasis    scarring at basis on CXR  . Benign familial tremor   . Chronic diastolic (congestive) heart failure (HCC)    a. EF of 55-60% in 08/2015  . Coronary artery disease 10/2009   S/P SVG to OM   . CRD (chronic renal disease), stage IV ([place])   . Fibrocystic breast disease   . GERD (gastroesophageal reflux disease)   . High cholesterol   . History of stomach ulcers   . Hypertension   . Macular degeneration, right eye   . Multinodular goiter   . Osteoporosis 12/2010   Reclast given  . Positive for microalbuminuria    On tevetan  . Skin cancer of lip    "had MOHS on upper lip"    Past Surgical History:  Procedure Laterality Date  . AORTIC VALVE REPLACEMENT  2011  . CARDIAC CATHETERIZATION  11/17/2009   Ar[facility] Endo 10/18/2009  . CARDIAC VALVE REPLACEMENT     . CATARACT EXTRACTION, BILATERAL Bilateral   . CORONARY ARTERY BYPASS GRAFT  2011   SVG-OM  . DILATION AND CURETTAGE OF UTERUS    . FRACTURE SURGERY    . MOHS SURGERY     upper lip  . NASAL SINUS SURGERY    . ROTATOR CUFF REPAIR Right    Archie Endo 02/10/2011  . TONSILLECTOMY    . TYMPANOPLASTY     "dr rebuilt my eardrum so I could hear; don't remember which side"  . WRIST FRACTURE SURGERY Left 11/2001   Archie Endo 02/10/2011     Inpatient Medications: Scheduled Meds: . calcium-vitamin D  1 tablet Oral BID  . doxazosin  3 mg Oral BID  . furosemide  80 mg Intravenous Q12H  . heparin  5,000 Units Subcutaneous Q8H  . LORazepam  0.5 mg Oral QHS  . Melatonin  6 mg Oral QHS  . multivitamin  1 tablet Oral BID  . NIFEdipine  60 mg Oral BID  . nitroGLYCERIN  0.4 mg Transdermal Daily  . potassium chloride SA  20 mEq Oral BID  . pravastatin  40 mg Oral q1800   Continuous Infusions:  PRN Meds: acetaminophen **OR** acetaminophen, ketotifen, sodium chloride  Allergies:    Allergies  Allergen Reactions  . Aceon [Perindopril] Other (See Comments)    Chest Pain  . Beta Adrenergic Blockers Other (See Comments)    Serious cough both times she tried  . Hydralazine Hcl Cough  .  Actonel [Risedronate Sodium] Other (See Comments)    Unknown  . Allegra [Fexofenadine] Other (See Comments)    BACK PAIN  . Biaxin [Clarithromycin] Nausea Only  . Ciprofloxacin Nausea Only  . Crestor [Rosuvastatin] Itching  . Fosamax [Alendronate Sodium] Other (See Comments)    Muscle pain  . Lipitor [Atorvastatin] Itching  . Promethazine Hcl Other (See Comments)    Unknown  . Simvastatin Rash  . Sulfa Antibiotics Other (See Comments)    GI Upset  . Welchol [Colesevelam Hcl] Rash    Social History:   Social History   Social History  . Marital status: Widowed    Spouse name: N/A  . Number of children: N/A  . Years of education: N/A   Occupational History  . Not on file.   Social History Main Topics   . Smoking status: Never Smoker  . Smokeless tobacco: Never Used  . Alcohol use No  . Drug use: No  . Sexual activity: Not on file   Other Topics Concern  . Not on file   Social History Narrative  . No narrative on file    Family History:   The patient's family history includes Coronary artery disease in her father and mother; Hyperlipidemia in her daughter.  ROS:  Please see the history of present illness.  Nasal congestion. Cough. No phlegm production. All other ROS reviewed and negative.     Physical Exam/Data:   Vitals:   02/19/17 1954 02/20/17 0100 02/20/17 0443 02/20/17 0953  BP: (!) 152/59 129/69 (!) 156/58 (!) 142/64  Pulse: 77 85 84   Resp: (!) 23 (!) 30 (!) 25   Temp: 97.8 F (36.6 C) 98 F (36.7 C) 98.3 F (36.8 C)   TempSrc: Oral Oral Oral   SpO2: 95% 95% 94%   Weight:   120 lb 8 oz (54.7 kg)   Height:        Intake/Output Summary (Last 24 hours) at 02/20/17 1200 Last data filed at 02/20/17 0447  Gross per 24 hour  Intake              480 ml  Output             1000 ml  Net             -520 ml   Filed Weights   02/18/17 0417 02/19/17 0243 02/20/17 0443  Weight: 115 lb (52.2 kg) 114 lb 4.8 oz (51.8 kg) 120 lb 8 oz (54.7 kg)   Body mass index is 22.77 kg/m.  General:  in no acute distress. Frail appearing sitting in the chair. HEENT: normal Lymph: no adenopathy Neck: no JVD Endocrine:  No thryomegaly Vascular: No carotid bruits; FA pulses 2+ bilaterally without bruits  Cardiac:  normal S1, S2; RRR; 2/6 right upper sternal border systolic diamond-shaped murmur compatible with the known aortic prosthesis. Lungs:  clear to auscultation bilaterally, no wheezing, rhonchi or rales  Abd: soft, nontender, no hepatomegaly  Ext: no edema Musculoskeletal:  No deformities, BUE and BLE strength normal and equal Skin: warm and dry  Neuro:  CNs 2-12 intact, no focal abnormalities noted Psych:  Normal affect    EKG:  The EKG was personally reviewed and  demonstrates on admission demonstrated sinus rhythm with nonspecific ST abnormality. No interventricular conduction delay.  Relevant CV Studies: 2-D Doppler echocardiogram 02/18/17: Study Conclusions  - Left ventricle: The cavity size was normal. Wall thickness was   increased in a pattern of mild LVH. There was mild  focal basal   hypertrophy of the septum. Systolic function was vigorous. The   estimated ejection fraction was in the range of 65% to 70%. Wall   motion was normal; there were no regional wall motion   abnormalities. Features are consistent with a pseudonormal left   ventricular filling pattern, with concomitant abnormal relaxation   and increased filling pressure (grade 2 diastolic dysfunction).   Doppler parameters are consistent with high ventricular filling   pressure. - Aortic valve: A bioprosthesis was present. - Mitral valve: Calcified annulus. There was mild regurgitation. - Left atrium: The atrium was severely dilated. - Right ventricle: The cavity size was mildly dilated. - Right atrium: The atrium was mildly dilated. - Pulmonary arteries: Systolic pressure was mildly increased.  Impressions:  - Vigorous LV systolic function; mild LVH; moderate diastolic   dysfunction with elevated LV filling pressure; s/p AVR with   normal mean gradient and no AI; mild MR; severe LAE; mild RAE and   RVE; mild TR with mildly elevated pulmonary pressure.  Laboratory Data:  Chemistry Recent Labs Lab 02/18/17 2325 02/19/17 0835 02/20/17 0259  NA 134* 134* 135  K 3.9 3.1* 4.4  CL 96* 95* 95*  CO2 28 27 27   GLUCOSE 126* 178* 111*  BUN 79* 77* 80*  CREATININE 3.35* 3.46* 3.36*  CALCIUM 9.6 10.2 10.1  GFRNONAA 11* 10* 11*  GFRAA 13* 12* 13*  ANIONGAP 10 12 13      Recent Labs Lab 02/16/17 1704  PROT 7.0  ALBUMIN 3.8  AST 24  ALT 18  ALKPHOS 38  BILITOT 0.6   Hematology Recent Labs Lab 02/16/17 1704 02/17/17 0506  WBC 8.2 5.1  RBC 3.60* 3.42*  HGB  10.1* 9.6*  HCT 31.7* 30.1*  MCV 88.1 88.0  MCH 28.1 28.1  MCHC 31.9 31.9  RDW 15.1 14.9  PLT 210 191   Cardiac Enzymes Recent Labs Lab 02/16/17 2354 02/17/17 0506 02/17/17 1102  TROPONINI 0.05* 0.05* 0.06*    Recent Labs Lab 02/16/17 1721 02/16/17 2006  TROPIPOC 0.05 0.06    BNP Recent Labs Lab 02/16/17 1704  BNP 1,244.4*    DDimer No results for input(s): DDIMER in the last 168 hours.  Radiology/Studies:  Dg Chest Portable 1 View  Result Date: 02/16/2017 CLINICAL DATA:  Dry cough and short of breath EXAM: PORTABLE CHEST 1 VIEW COMPARISON:  10/02/2015 FINDINGS: Post sternotomy changes and valve prosthesis. Widened right AC joint as before with possible postsurgical changes of distal right clavicle. Hyperinflation with coarse interstitial opacities some of which are likely chronic. Streaky atelectasis or infiltrate at the left base. Trace pleural effusions. Cardiomegaly. IMPRESSION: 1. Streaky atelectasis or mild infiltrate at the left base with trace effusions 2. Cardiomegaly 3. Diffuse coarse interstitial opacities, likely chronic. Electronically Signed   By: Donavan Foil M.D.   On: 02/16/2017 18:17    Assessment and Plan:    1. Acute on chronic diastolic heart failure (recent echo documents preserved LV systolic function), now improved with IV diuresis. Overall clinical condition is being influenced by significant underlying chronic kidney disease. May require a slight increase in outpatient diuretic regimen. She feels back to baseline with reference to breathing. She is 3.5 L negative. Resume by mouth diuretics in a.m. May need to move her metolazone to maintain weight as outpatient. 2. Nonsustained wide complex tachycardia is not of great concern. We need to ensure that electrolyte status is stable. This would include documenting her magnesium. Upon review of today's laboratory data these  are stable. This requires no further management unless symptomatic sustained  arrhythmia. 3. Prosthetic aortic valve with essentially normal function. 4. Coronary artery disease without evidence of acute coronary syndrome    With diuresis she seems improved compared to baseline upon admission. She is close to discharge. Heart failure team needs to be notified of her presence. She is seen chronically by Dr. Glori Bickers.   Farrel Gordon, MD 02/20/17  12:.00 PM

## 2017-02-21 DIAGNOSIS — I472 Ventricular tachycardia, unspecified: Secondary | ICD-10-CM

## 2017-02-21 LAB — BASIC METABOLIC PANEL
ANION GAP: 8 (ref 5–15)
BUN: 90 mg/dL — ABNORMAL HIGH (ref 6–20)
CALCIUM: 9.9 mg/dL (ref 8.9–10.3)
CO2: 29 mmol/L (ref 22–32)
Chloride: 98 mmol/L — ABNORMAL LOW (ref 101–111)
Creatinine, Ser: 3.54 mg/dL — ABNORMAL HIGH (ref 0.44–1.00)
GFR calc Af Amer: 12 mL/min — ABNORMAL LOW (ref >60)
GFR calc non Af Amer: 10 mL/min — ABNORMAL LOW (ref >60)
Glucose, Bld: 114 mg/dL — ABNORMAL HIGH (ref 65–99)
Potassium: 4.4 mmol/L (ref 3.5–5.1)
SODIUM: 135 mmol/L (ref 135–145)

## 2017-02-21 MED ORDER — FUROSEMIDE 80 MG PO TABS: 120.0000 mg | ORAL_TABLET | Freq: Two times a day (BID) | ORAL | 0 refills | Status: DC

## 2017-02-21 NOTE — Progress Notes (Signed)
SATURATION QUALIFICATIONS: (This note is used to comply with regulatory documentation for home oxygen)  Patient Saturations on Room Air at Rest =  88 %  Patient Saturations on Room Air while Ambulating = 84 %  Patient Saturations on 4 Liters of oxygen while Ambulating = 92 %  Why patient needs home oxygen: O2 SAT bellow 90 % on RA at rest and dropped further while ambulating. Required 4 L of 02 to have sat aboe 90 %.   Ferdinand Lango, RN

## 2017-02-21 NOTE — Discharge Summary (Signed)
Physician Discharge Summary  Victoria Banks:[id number] DOB: May 15, 1921 DOA: 02/16/2017  PCP: Lajean Manes, MD  Admit date: 02/16/2017 Discharge date: 02/21/2017  Time spent: > 35 minutes  Recommendations for Outpatient Follow-up:  1. Monitor serum creatinine levels 2. Lasix regimen recently increased 3. Ensure patient follows up with her primary cardiologist within the next one or 2 weeks   Discharge Diagnoses:  Active Problems:   CKD (chronic kidney disease) stage 4, GFR 15-29 ml/min (HCC)   CAD (coronary artery disease) of artery bypass graft   S/P AVR   Essential hypertension   Acute respiratory failure with hypoxia (HCC)   Acute on chronic diastolic CHF (congestive heart failure) (HCC)   VT (ventricular tachycardia) ([place])   Discharge Condition: Stable  Diet recommendation: Heart healthy  Filed Weights   02/19/17 0243 02/20/17 0443 02/21/17 0428  Weight: 51.8 kg (114 lb 4.8 oz) 54.7 kg (120 lb 8 oz) 52.3 kg (115 lb 4.8 oz)    History of present illness:  83 y.o. female with history of diastolic CHF, chronic kidney disease, bioprosthetic aortic valve replacement, chronic anemia, CAD status post CABG presents to the ER with complaints of shortness of breath.  Was diagnosed with acute on chronic diastolic congestive heart failure  Hospital Course:  Active Problems:   Acute on chronic diastolic CHF (congestive heart failure) (HCC) - Pt has been seen by cardiology who recommended the following: 1. Nonsustained ventricular tachycardia, without significant recurrence, and possibly related to hypokalemia. No further management of this problem is required at her age and with normal LV function. 2. Acute on chronic diastolic heart failure. Seems to be adequately diuresed. A decision needs to be made about discharge diuretic regimen. Probably needs to be slightly stronger than on admission. The decision is critical and she is on a very thin line between worsening kidney  function on the one hand if too aggressive and recurrent volume overload on the other. I will recommend that she get back to the heart failure clinic to see Dr. Reine Just sooner than her previously planned appointment in late June. I would suggest furosemide 120 mg twice a day. She will need to have repeat metabolic profile 3-4 days after discharge if she goes home today.  She needs to ambulate without oxygen prior to discharge to ensure she is stable enough to go home.   CKD (chronic kidney disease) stage 4, GFR 15-29 ml/min (HCC) - Continue monitoring serum creatinine    CAD (coronary artery disease) of artery bypass graft - continue statin. No chest pain reported.  Hypokalemia - resolved    S/P AVR   Essential hypertension - Pt on nifedipine    Acute respiratory failure with hypoxia (HCC)  - continue supplemental oxygen on discharge patient may require pending whether she improves or not. May also consider PFTs as outpatient   Procedures:  None  Consultations:  Cardiology  Discharge Exam: Vitals:   02/21/17 0911 02/21/17 1355  BP: (!) 157/53 (!) 161/77  Pulse:  74  Resp:  (!) 28  Temp:  98.7 F (37.1 C)    General: Patient in no acute distress, alert and awake Cardiovascular: Regular rate and rhythm, no murmurs or rubs Respiratory: No increased work of breathing, no wheezes  Discharge Instructions   Discharge Instructions    Call MD for:  difficulty breathing, headache or visual disturbances    Complete by:  As directed    Call MD for:  temperature >100.4    Complete by:  As  directed    Diet - low sodium heart healthy    Complete by:  As directed    Discharge instructions    Complete by:  As directed    Please be sure to follow-up with your cardiologist within the next week. He will need to have a BMP drawn in the next 3 or 4 days given the increase in Lasix   Increase activity slowly    Complete by:  As directed      Current Discharge Medication List     CONTINUE these medications which have CHANGED   Details  furosemide (LASIX) 80 MG tablet Take 1.5 tablets (120 mg total) by mouth 2 (two) times daily. Qty: 60 tablet, Refills: 0      CONTINUE these medications which have NOT CHANGED   Details  acetaminophen (TYLENOL) 325 MG tablet Take 325 mg by mouth every 6 (six) hours as needed (for pain).    calcium citrate-vitamin D (CALCIUM CITRATE +) 315-200 MG-UNIT per tablet Take 1 tablet by mouth 2 (two) times daily.    doxazosin (CARDURA) 2 MG tablet Take 1.5 tablets (3 mg total) by mouth 2 (two) times daily. Qty: 90 tablet, Refills: 3    ketotifen (THERA TEARS ALLERGY) 0.025 % ophthalmic solution Place 1 drop into both eyes 3 (three) times daily as needed (for dry eyes).     LORazepam (ATIVAN) 0.5 MG tablet Take 0.5 mg by mouth at bedtime.     magnesium hydroxide (MILK OF MAGNESIA) 400 MG/5ML suspension Take 15 mLs by mouth at bedtime.    Melatonin 3 MG TABS Take 6 mg by mouth at bedtime.    !! Multiple Vitamins-Minerals (CENTRUM SILVER ULTRA WOMENS PO) Take 1 tablet by mouth daily.    !! Multiple Vitamins-Minerals (PRESERVISION AREDS) TABS Take 1 tablet by mouth 2 (two) times daily.     NIFEdipine (PROCARDIA-XL/ADALAT CC) 60 MG 24 hr tablet Take 60 mg by mouth 2 (two) times daily.    potassium chloride SA (K-DUR,KLOR-CON) 20 MEQ tablet TAKE 1 TABLET TWICE DAILY. Qty: 60 tablet, Refills: 0    pravastatin (PRAVACHOL) 40 MG tablet TAKE 1 TABLET ONCE DAILY IN THE EVENING. Qty: 30 tablet, Refills: 11     !! - Potential duplicate medications found. Please discuss with provider.     Allergies  Allergen Reactions  . Aceon [Perindopril] Other (See Comments)    Chest Pain  . Beta Adrenergic Blockers Other (See Comments)    Serious cough both times she tried  . Hydralazine Hcl Cough  . Actonel [Risedronate Sodium] Other (See Comments)    Unknown  . Allegra [Fexofenadine] Other (See Comments)    BACK PAIN  . Biaxin [Clarithromycin]  Nausea Only  . Ciprofloxacin Nausea Only  . Crestor [Rosuvastatin] Itching  . Fosamax [Alendronate Sodium] Other (See Comments)    Muscle pain  . Lipitor [Atorvastatin] Itching  . Promethazine Hcl Other (See Comments)    Unknown  . Simvastatin Rash  . Sulfa Antibiotics Other (See Comments)    GI Upset  . Welchol [Colesevelam Hcl] Rash      The results of significant diagnostics from this hospitalization (including imaging, microbiology, ancillary and laboratory) are listed below for reference.    Significant Diagnostic Studies: Dg Chest Portable 1 View  Result Date: 02/16/2017 CLINICAL DATA:  Dry cough and short of breath EXAM: PORTABLE CHEST 1 VIEW COMPARISON:  10/02/2015 FINDINGS: Post sternotomy changes and valve prosthesis. Widened right AC joint as before with possible postsurgical changes of distal  right clavicle. Hyperinflation with coarse interstitial opacities some of which are likely chronic. Streaky atelectasis or infiltrate at the left base. Trace pleural effusions. Cardiomegaly. IMPRESSION: 1. Streaky atelectasis or mild infiltrate at the left base with trace effusions 2. Cardiomegaly 3. Diffuse coarse interstitial opacities, likely chronic. Electronically Signed   By: Donavan Foil M.D.   On: 02/16/2017 18:17   Dg Foot Complete Left  Result Date: 01/27/2017 CLINICAL DATA:  Foot injury 1 week ago with lateral bruising and pain, initial encounter EXAM: LEFT FOOT - COMPLETE 3+ VIEW COMPARISON:  None. FINDINGS: Postsurgical changes are noted at the first metatarsals distally consistent with the given clinical history. Fusion of the second proximal and middle phalanges is noted. No acute fracture or dislocation is seen. No soft tissue abnormality is noted. IMPRESSION: Chronic changes without acute abnormality. Electronically Signed   By: Inez Catalina M.D.   On: 01/27/2017 19:12    Microbiology: Recent Results (from the past 240 hour(s))  MRSA PCR Screening     Status: None    Collection Time: 02/17/17 12:48 AM  Result Value Ref Range Status   MRSA by PCR NEGATIVE NEGATIVE Final    Comment:        The GeneXpert MRSA Assay (FDA approved for NASAL specimens only), is one component of a comprehensive MRSA colonization surveillance program. It is not intended to diagnose MRSA infection nor to guide or monitor treatment for MRSA infections.      Labs: Basic Metabolic Panel:  Recent Labs Lab 02/16/17 2354  02/18/17 0417 02/18/17 2325 02/19/17 0835 02/20/17 0259 02/21/17 0339  NA  --   < > 135 134* 134* 135 135  K  --   < > 4.3 3.9 3.1* 4.4 4.4  CL  --   < > 95* 96* 95* 95* 98*  CO2  --   < > 29 28 27 27 29   GLUCOSE  --   < > 144* 126* 178* 111* 114*  BUN  --   < > 75* 79* 77* 80* 90*  CREATININE  --   < > 3.41* 3.35* 3.46* 3.36* 3.54*  CALCIUM  --   < > 9.8 9.6 10.2 10.1 9.9  MG 3.8*  --   --   --   --  2.4  --   PHOS  --   --   --   --   --  5.5*  --   < > = values in this interval not displayed. Liver Function Tests:  Recent Labs Lab 02/16/17 1704  AST 24  ALT 18  ALKPHOS 38  BILITOT 0.6  PROT 7.0  ALBUMIN 3.8   No results for input(s): LIPASE, AMYLASE in the last 168 hours. No results for input(s): AMMONIA in the last 168 hours. CBC:  Recent Labs Lab 02/16/17 1704 02/17/17 0506  WBC 8.2 5.1  NEUTROABS 5.8  --   HGB 10.1* 9.6*  HCT 31.7* 30.1*  MCV 88.1 88.0  PLT 210 191   Cardiac Enzymes:  Recent Labs Lab 02/16/17 2354 02/17/17 0506 02/17/17 1102  TROPONINI 0.05* 0.05* 0.06*   BNP: BNP (last 3 results)  Recent Labs  02/16/17 1704  BNP 1,244.4*    ProBNP (last 3 results) No results for input(s): PROBNP in the last 8760 hours.  CBG: No results for input(s): GLUCAP in the last 168 hours.  Signed:  Velvet Bathe MD.  [facility] 02/21/2017, 3:37 PM

## 2017-02-21 NOTE — Progress Notes (Signed)
Progress Note  Patient Name: Victoria Banks Date of Encounter: 02/21/2017  Primary Cardiologist: Glori Bickers  Subjective   Feels well. Wants to go home. Feels her breathing is back to normal. Has not ambulated without oxygen.  Inpatient Medications    Scheduled Meds: . calcium-vitamin D  1 tablet Oral BID  . doxazosin  3 mg Oral BID  . heparin  5,000 Units Subcutaneous Q8H  . LORazepam  0.5 mg Oral QHS  . Melatonin  6 mg Oral QHS  . multivitamin  1 tablet Oral BID  . NIFEdipine  60 mg Oral BID  . nitroGLYCERIN  0.4 mg Transdermal Daily  . potassium chloride SA  20 mEq Oral BID  . pravastatin  40 mg Oral q1800   Continuous Infusions:  PRN Meds: acetaminophen **OR** acetaminophen, ketotifen, sodium chloride   Vital Signs    Vitals:   02/21/17 0428 02/21/17 0500 02/21/17 0600 02/21/17 0911  BP: (!) 155/54   (!) 157/53  Pulse: 73 72 75   Resp: (!) 29 20 (!) 24   Temp: 97.9 F (36.6 C)     TempSrc: Oral     SpO2: 94% 92% 95%   Weight: 115 lb 4.8 oz (52.3 kg)     Height:        Intake/Output Summary (Last 24 hours) at 02/21/17 0917 Last data filed at 02/21/17 0857  Gross per 24 hour  Intake              850 ml  Output             1150 ml  Net             -300 ml   Filed Weights   02/19/17 0243 02/20/17 0443 02/21/17 0428  Weight: 114 lb 4.8 oz (51.8 kg) 120 lb 8 oz (54.7 kg) 115 lb 4.8 oz (52.3 kg)    Telemetry    Sinus rhythm with occasional PVCs. No nonsustained VT. - Personally Reviewed  ECG    Not repeated - Personally Reviewed  Physical Exam  Elderly and frail GEN: No acute distress.   Neck: No JVD Cardiac: RRR, no murmurs, rubs, or gallops.  Respiratory:  faint mid lung crackles and rhonchi. No wheezing. GI: Soft, nontender, non-distended  MS: No edema; No deformity. Neuro:  Nonfocal  Psych: Normal affect   Labs    Chemistry Recent Labs Lab 02/16/17 1704  02/19/17 0835 02/20/17 0259 02/21/17 0339  NA 137  < > 134* 135 135    K 4.7  < > 3.1* 4.4 4.4  CL 97*  < > 95* 95* 98*  CO2 27  < > 27 27 29   GLUCOSE 99  < > 178* 111* 114*  BUN 71*  < > 77* 80* 90*  CREATININE 3.39*  < > 3.46* 3.36* 3.54*  CALCIUM 9.8  < > 10.2 10.1 9.9  PROT 7.0  --   --   --   --   ALBUMIN 3.8  --   --   --   --   AST 24  --   --   --   --   ALT 18  --   --   --   --   ALKPHOS 38  --   --   --   --   BILITOT 0.6  --   --   --   --   GFRNONAA 11*  < > 10* 11* 10*  GFRAA 12*  < >  12* 13* 12*  ANIONGAP 13  < > 12 13 8   < > = values in this interval not displayed.   Hematology Recent Labs Lab 02/16/17 1704 02/17/17 0506  WBC 8.2 5.1  RBC 3.60* 3.42*  HGB 10.1* 9.6*  HCT 31.7* 30.1*  MCV 88.1 88.0  MCH 28.1 28.1  MCHC 31.9 31.9  RDW 15.1 14.9  PLT 210 191    Cardiac Enzymes Recent Labs Lab 02/16/17 2354 02/17/17 0506 02/17/17 1102  TROPONINI 0.05* 0.05* 0.06*    Recent Labs Lab 02/16/17 1721 02/16/17 2006  TROPIPOC 0.05 0.06     BNP Recent Labs Lab 02/16/17 1704  BNP 1,244.4*     DDimer No results for input(s): DDIMER in the last 168 hours.   Radiology    No results found.  Cardiac Studies   No new data  Patient Profile     83 y.o. female with a hx of Bioprosthetic aortic valve, CAD, stage IV chronic kidney disease, chronic diastolic heart failure who is being seen today for the evaluation of nonsustained ventricular tachycardia.  Assessment & Plan    1. Nonsustained ventricular tachycardia, without significant recurrence, and possibly related to hypokalemia. No further management of this problem is required at her age and with normal LV function. 2. Acute on chronic diastolic heart failure. Seems to be adequately diuresed. A decision needs to be made about discharge diuretic regimen. Probably needs to be slightly stronger than on admission. The decision is critical and she is on a very thin line between worsening kidney function on the one hand if too aggressive and recurrent volume overload on  the other. I will recommend that she get back to the heart failure clinic to see Dr. Reine Just sooner than her previously planned appointment in late June. I would suggest furosemide 120 mg twice a day. She will need to have repeat metabolic profile 3-4 days after discharge if she goes home today.  She needs to ambulate without oxygen prior to discharge to ensure she is stable enough to go home.  Signed, Sinclair Grooms, MD  02/21/2017, 9:17 AM

## 2017-02-21 NOTE — Care Management Note (Signed)
Case Management Note  Patient Details  Name: Victoria Banks MRN: [id number] Date of Birth: 07/27/1921  Subjective/Objective:                  Acute Respiratory Failure Action/Plan: Discharge planning Expected Discharge Date:  02/21/17               Expected Discharge Plan:  Home/Self Care  In-House Referral:  NA  Discharge planning Services  CM Consult  Post Acute Care Choice:  NA Choice offered to:  Patient, Adult Children  DME Arranged:  Oxygen DME Agency:  [place] Arranged:  NA [place] Agency:  NA  Status of Service:  Completed, signed off  If discussed at [place] of Stay Meetings, dates discussed:    Additional Comments: CM spoke with family as to how home O2 will work.  CM notified [facility] to please arrange for transport tanks for pt to get home this evening and [facility] will be at pt's apartment to set up the concentrator.  No other CM needs were communicated. Dellie Catholic, RN 02/21/2017, 4:06 PM

## 2017-02-23 ENCOUNTER — Telehealth (HOSPITAL_COMMUNITY): Payer: Self-pay | Admitting: *Deleted

## 2017-02-23 DIAGNOSIS — I5032 Chronic diastolic (congestive) heart failure: Secondary | ICD-10-CM

## 2017-02-23 NOTE — Telephone Encounter (Signed)
Patient's daughter called in saying that they needed to schedule a post hospital follow up and also was told at discharge that patient needed a BMET in a couple days.  Post hospital appointment made in our APP clinic and lab appointment made as well.  Daughter is aware of both appointments.  Lab order placed.

## 2017-02-25 ENCOUNTER — Ambulatory Visit (HOSPITAL_COMMUNITY)
Admission: RE | Admit: 2017-02-25 | Discharge: 2017-02-25 | Disposition: A | Discharge status: Home / Self Care | Payer: Medicare Other | Source: Ambulatory Visit | Attending: Cardiology | Admitting: Cardiology

## 2017-02-25 DIAGNOSIS — I5032 Chronic diastolic (congestive) heart failure: Secondary | ICD-10-CM | POA: Diagnosis not present

## 2017-02-25 LAB — BASIC METABOLIC PANEL
ANION GAP: 10 (ref 5–15)
BUN: 81 mg/dL — ABNORMAL HIGH (ref 6–20)
CALCIUM: 10.6 mg/dL — AB (ref 8.9–10.3)
CO2: 28 mmol/L (ref 22–32)
Chloride: 100 mmol/L — ABNORMAL LOW (ref 101–111)
Creatinine, Ser: 3.12 mg/dL — ABNORMAL HIGH (ref 0.44–1.00)
GFR calc non Af Amer: 12 mL/min — ABNORMAL LOW (ref >60)
GFR, EST AFRICAN AMERICAN: 14 mL/min — AB (ref >60)
Glucose, Bld: 125 mg/dL — ABNORMAL HIGH (ref 65–99)
Potassium: 4.3 mmol/L (ref 3.5–5.1)
Sodium: 138 mmol/L (ref 135–145)

## 2017-03-02 ENCOUNTER — Inpatient Hospital Stay (HOSPITAL_COMMUNITY): Payer: Medicare Other | Admitting: Internal Medicine

## 2017-03-09 DIAGNOSIS — Z79899 Other long term (current) drug therapy: Secondary | ICD-10-CM | POA: Diagnosis not present

## 2017-03-09 DIAGNOSIS — I129 Hypertensive chronic kidney disease with stage 1 through stage 4 chronic kidney disease, or unspecified chronic kidney disease: Secondary | ICD-10-CM | POA: Diagnosis not present

## 2017-03-09 DIAGNOSIS — D649 Anemia, unspecified: Secondary | ICD-10-CM | POA: Diagnosis not present

## 2017-03-09 DIAGNOSIS — N184 Chronic kidney disease, stage 4 (severe): Secondary | ICD-10-CM | POA: Diagnosis not present

## 2017-03-09 DIAGNOSIS — I350 Nonrheumatic aortic (valve) stenosis: Secondary | ICD-10-CM | POA: Diagnosis not present

## 2017-03-09 DIAGNOSIS — J301 Allergic rhinitis due to pollen: Secondary | ICD-10-CM | POA: Diagnosis not present

## 2017-03-09 DIAGNOSIS — J9691 Respiratory failure, unspecified with hypoxia: Secondary | ICD-10-CM | POA: Diagnosis not present

## 2017-03-09 DIAGNOSIS — I5030 Unspecified diastolic (congestive) heart failure: Secondary | ICD-10-CM | POA: Diagnosis not present

## 2017-03-10 ENCOUNTER — Ambulatory Visit (HOSPITAL_COMMUNITY)
Admission: RE | Admit: 2017-03-10 | Discharge: 2017-03-10 | Disposition: A | Discharge status: Home / Self Care | Payer: Medicare Other | Source: Ambulatory Visit | Attending: Internal Medicine | Admitting: Internal Medicine

## 2017-03-10 VITALS — BP 188/70 | HR 79 | Wt 112.0 lb

## 2017-03-10 DIAGNOSIS — I25810 Atherosclerosis of coronary artery bypass graft(s) without angina pectoris: Secondary | ICD-10-CM

## 2017-03-10 DIAGNOSIS — I350 Nonrheumatic aortic (valve) stenosis: Secondary | ICD-10-CM | POA: Insufficient documentation

## 2017-03-10 DIAGNOSIS — Z952 Presence of prosthetic heart valve: Secondary | ICD-10-CM

## 2017-03-10 DIAGNOSIS — I2510 Atherosclerotic heart disease of native coronary artery without angina pectoris: Secondary | ICD-10-CM | POA: Insufficient documentation

## 2017-03-10 DIAGNOSIS — I130 Hypertensive heart and chronic kidney disease with heart failure and stage 1 through stage 4 chronic kidney disease, or unspecified chronic kidney disease: Secondary | ICD-10-CM | POA: Diagnosis not present

## 2017-03-10 DIAGNOSIS — E042 Nontoxic multinodular goiter: Secondary | ICD-10-CM | POA: Diagnosis not present

## 2017-03-10 DIAGNOSIS — Z953 Presence of xenogenic heart valve: Secondary | ICD-10-CM | POA: Insufficient documentation

## 2017-03-10 DIAGNOSIS — E785 Hyperlipidemia, unspecified: Secondary | ICD-10-CM | POA: Diagnosis not present

## 2017-03-10 DIAGNOSIS — R251 Tremor, unspecified: Secondary | ICD-10-CM | POA: Diagnosis not present

## 2017-03-10 DIAGNOSIS — I5032 Chronic diastolic (congestive) heart failure: Secondary | ICD-10-CM

## 2017-03-10 DIAGNOSIS — K219 Gastro-esophageal reflux disease without esophagitis: Secondary | ICD-10-CM | POA: Insufficient documentation

## 2017-03-10 DIAGNOSIS — J9610 Chronic respiratory failure, unspecified whether with hypoxia or hypercapnia: Secondary | ICD-10-CM | POA: Diagnosis not present

## 2017-03-10 DIAGNOSIS — I10 Essential (primary) hypertension: Secondary | ICD-10-CM | POA: Diagnosis not present

## 2017-03-10 DIAGNOSIS — Z951 Presence of aortocoronary bypass graft: Secondary | ICD-10-CM | POA: Diagnosis not present

## 2017-03-10 DIAGNOSIS — N184 Chronic kidney disease, stage 4 (severe): Secondary | ICD-10-CM

## 2017-03-10 MED ORDER — FUROSEMIDE 80 MG PO TABS: 80.0000 mg | ORAL_TABLET | Freq: Two times a day (BID) | ORAL | 6 refills | Status: AC

## 2017-03-10 MED ORDER — DOXAZOSIN MESYLATE 2 MG PO TABS: ORAL_TABLET | ORAL | 6 refills | Status: AC

## 2017-03-10 NOTE — Progress Notes (Signed)
Advanced Heart Failure Medication Review by a Pharmacist  Does the patient  feel that his/her medications are working for him/her?  yes  Has the patient been experiencing any side effects to the medications prescribed?  no  Does the patient measure his/her own blood pressure or blood glucose at home?  yes   Does the patient have any problems obtaining medications due to transportation or finances?   no  Understanding of regimen: good Understanding of indications: good Potential of compliance: good Patient understands to avoid NSAIDs. Patient understands to avoid decongestants.  Issues to address at subsequent visits: none   Pharmacist comments: Victoria Banks is a pleasant 83 yo female presenting with her daughter today. They did not bring Rx bottles but have a good understanding of medication regimen. Per patient, she does not need to use O2 all the time. Has been taking furosemide 1 tablet (80 mg) BID unless she feels that she isn't peeing enough. Took 1.5 tabs last night at instruction of her PCP. No other issues or questions.   Victoria Banks, Pharm.D. PGY1 Pharmacy Resident 6/13/201811:17 AM Pager [phone]     Time with patient: 10 mins Preparation and documentation time: 3 mins Total time: 13 mins

## 2017-03-10 NOTE — Patient Instructions (Signed)
INCREASE Cardura to 3 mg (1.5 tabs) in am and 4 mg (2 tabs) in pm.  Continue Lasix 80 mg (1 tab) twice daily. May take EXTRA 40 mg once daily as needed for weight gain/swelling.  Do not exceed extra lasix more than three times weekly.  Call our clinic if you feel you still need extra lasix more than three times weekly.  Follow up with Dr. Haroldine Laws in 2-3 months. Take all medication as prescribed the day of your appointment. Bring all medications with you to your appointment.  Do the following things EVERYDAY: 1) Weigh yourself in the morning before breakfast. Write it down and keep it in a log. 2) Take your medicines as prescribed 3) Eat low salt foods-Limit salt (sodium) to 2000 mg per day.  4) Stay as active as you can everyday 5) Limit all fluids for the day to less than 2 liters

## 2017-03-10 NOTE — Progress Notes (Signed)
Advanced Heart Failure Clinic Note   Patient ID: Victoria Banks, female   DOB: [birth year], 83 y.o.   MRN: [id number] PCP: Dr. Felipa Eth HF: Dr. Marcello Fennel is a 83 y.o. female with history of bioprosthetic AVR, CAD, CKD IV and chronic diastolic.    Previously admitted to [facility] with acute on chronic diastolic CHF with hypoxia and needed to be placed on BiPAP at the time of admission. Diuresed from 119-> 111 lbs on discharge. Home O2 evaluation was done, patient qualifies for 2 L home O2 on ambulation. Patient was transitioned to oral Lasix 80 mg twice a day with Metolazone as needed. Creatinine was 2.4 on admission and 2.28 on d/c on 10/07/15 - 2-D echo in 12/16 had shown EF of 55-60% with no regional wall motion abnormalities.  Most recently admitted 5/22 -> 02/21/17  She presents today for post hospital follow up as above.  Feeling much better since. She was discharged on lasix 120 mg BID but has been taking 80 mg BID and an extra 40 mg as needed (2-3 times a week). She felt dried out on 120 mg BID every day. Denies lightheadedness or dizziness. She hasn't been on any metolazone since last visit. Follows her BP at home. Runs from 150-180s. Denies SOB getting around the facility. Walking around the atrium several times a day without needing to stop. Denies CP.   Echo 02/18/17 LVEF 65-70%, Grade 2 DD, Mild MR, Severe LAE, Mild RV dilation, Mild RAE  Labs (10/15): K 4.4, creatinine 1.9, HCT 33.8, LDL 145 Labs (11/15): K 4.4, creatinine 1.47, BNP 1863 Labs (2/16) K 4.4, creatinine 1.65 Labs (02/22/15) K 4.2, creatinine 1.81 (by Dr Felipa Eth her PCP) Labs (6/16): K 4.3, creatinine 1.86 Labs (1/17): K 43.9, creatinine 2.29 Labs (03/10/2017) Creatinine 3.10, BUN 77, K 4.2, Hgb 10.3  PMH: 1. HTN: Clonidine caused a dry mouth. BB caused serious cough 2. Hyperlipidemia 3. GERD 4. Aortic stenosis: s/p bioprosthetic aortic valve replacement in 2/11.  5. CAD: s/p SVG-OM  in 2/11 with AVR.  6. Tremor 7. Multinodular goiter 8. Chronic diastolic CHF: Echo (70/62) with EF 55-60%, mild LVH, moderate diastolic dysfunction, normal bioprosthetic aortic valve, mild MR, PA systolic pressure 37 mmHg.   SH: Nonsmoker, lives at [facility], daughter is in town.   FH: No premature CAD.  Review of systems complete and found to be negative unless listed in HPI.    Current Outpatient Prescriptions  Medication Sig Dispense Refill  . acetaminophen (TYLENOL) 325 MG tablet Take 325 mg by mouth every 6 (six) hours as needed (for pain).    . calcium citrate-vitamin D (CALCIUM CITRATE +) 315-200 MG-UNIT per tablet Take 1 tablet by mouth 2 (two) times daily.    Marland Kitchen doxazosin (CARDURA) 2 MG tablet Take 1.5 tablets (3 mg total) by mouth 2 (two) times daily. 90 tablet 3  . fluticasone (FLONASE) 50 MCG/ACT nasal spray Place 1 spray into both nostrils daily.    . furosemide (LASIX) 80 MG tablet Take 1-1.5 tablets (80-120 mg) by mouth twice daily    . ketotifen (THERA TEARS ALLERGY) 0.025 % ophthalmic solution Place 1 drop into both eyes 3 (three) times daily as needed (for dry eyes).     . LORazepam (ATIVAN) 0.5 MG tablet Take 0.5 mg by mouth at bedtime.     . magnesium hydroxide (MILK OF MAGNESIA) 400 MG/5ML suspension Take 15 mLs by mouth at bedtime.    . Melatonin  3 MG TABS Take 6 mg by mouth at bedtime.    . Multiple Vitamins-Minerals (CENTRUM SILVER ULTRA WOMENS PO) Take 1 tablet by mouth daily.    . Multiple Vitamins-Minerals (PRESERVISION AREDS) TABS Take 1 tablet by mouth 2 (two) times daily.     Marland Kitchen NIFEdipine (PROCARDIA-XL/ADALAT CC) 60 MG 24 hr tablet Take 60 mg by mouth 2 (two) times daily.    . potassium chloride SA (K-DUR,KLOR-CON) 20 MEQ tablet Take 20 mEq by mouth 2 (two) times daily.    . pravastatin (PRAVACHOL) 40 MG tablet Take 40 mg by mouth every evening.     No current facility-administered medications for this encounter.    BP (!) 188/70    Pulse 79   Wt 112 lb (50.8 kg)   SpO2 97% Comment: on 3L O2  BMI 21.16 kg/m    Wt Readings from Last 3 Encounters:  03/10/17 112 lb (50.8 kg)  02/21/17 115 lb 4.8 oz (52.3 kg)  11/13/16 114 lb 12.8 oz (52.1 kg)    General: NAD. Elderly frail  HEENT: Normal.  Neck: JVP 6. No thyromegaly or lymphadenopathy appreciated. Lungs: crackles at bases left > right CV: Nondisplaced PMI.  Heart regular S1/S2, no S3/S4, 2/6 early SEM RUSB with mild radiation to carotids.  No  edema. Normal pedal pulses.  Abdomen: Soft, NT, ND, no HSM. No bruits or masses. +BS  Skin: Intact without lesions or rashes.  Neurologic: Alert and oriented x 3.  Psych: Normal affect. Extremities: No clubbing or cyanosis.    Assessment/Plan: 1. Chronic diastolic CHF:   -  Volume status stable on exam and NYHA class II.  - Continue current lasix regimen. Have asked her to make sure she is not taking metolazone bid. Her daughter will check meds and call us.  2. HTN:  - BP remains elevated. Goal 140-160  - Continue Nifedipine 90 BID - Intolerant of BB and clonidine and Hydralazine has not been effective in the past.  - Increase doxazosin to 3 mg q am and 4 mg q pm. Let us know immediately with any lightheadedness  3. CKD stage IV:  - Creatinine 3.10 (stable) at Dr. Felipa Eth office yesterday.  4. Bioprosthetic aortic valve:  - Stable on echo 02/18/17   Mean gradient 13 mm Hg, Peak gradient 27 mm Hg.  - She knows to take 4 amoxicillin for surgical prophylaxis with her valve. 5. CAD: s/p CABG with SVG-OM at time of AVR.   - She remains on ASA 81 and statin.  - No CP 6. Chronic respiratory failure - Now on 02 at facility. Not using for dinner. 02 sats stable ambulating in to clinic.   Doing well overall s/p recent hospitalization. Reviewed sliding scale diuretics. Increase doxazosin as above. Call immediately with any lightheadedness or dizziness.   RTC 2-3 months.  Labs stable at Dr. Felipa Eth office yesterday.    Shirley Friar, PA-C  03/10/2017   Patient seen and examined with the above-signed Advanced Practice Provider and/or Housestaff. I personally reviewed laboratory data, imaging studies and relevant notes. I independently examined the patient and formulated the important aspects of the plan. I have edited the note to reflect any of my changes or salient points. I have personally discussed the plan with the patient and/or family.  Doing well. Volume status improved. Perhaps just mildly elevated today. Long discussion about iuse of sliding scale. We were going to let her use occasional metolazone dosing but daughter daughter prefers to avoid giving that  she was using too much in past. Increase doxazosin for HTN. Be careful to avoid hypotension.    Glori Bickers, MD  11:25 PM

## 2017-03-16 ENCOUNTER — Other Ambulatory Visit (HOSPITAL_COMMUNITY): Payer: PRIVATE HEALTH INSURANCE

## 2017-03-16 ENCOUNTER — Encounter (HOSPITAL_COMMUNITY): Payer: PRIVATE HEALTH INSURANCE | Admitting: Internal Medicine

## 2017-04-07 ENCOUNTER — Emergency Department (HOSPITAL_COMMUNITY): Payer: Medicare Other

## 2017-04-07 ENCOUNTER — Emergency Department (HOSPITAL_COMMUNITY)
Admission: EM | Admit: 2017-04-07 | Discharge: 2017-04-07 | Disposition: A | Discharge status: Home / Self Care | Payer: Medicare Other | Attending: Emergency Medicine | Admitting: Emergency Medicine

## 2017-04-07 ENCOUNTER — Encounter (HOSPITAL_COMMUNITY): Payer: Self-pay | Admitting: Emergency Medicine

## 2017-04-07 DIAGNOSIS — G8911 Acute pain due to trauma: Secondary | ICD-10-CM | POA: Diagnosis not present

## 2017-04-07 DIAGNOSIS — I130 Hypertensive heart and chronic kidney disease with heart failure and stage 1 through stage 4 chronic kidney disease, or unspecified chronic kidney disease: Secondary | ICD-10-CM | POA: Diagnosis not present

## 2017-04-07 DIAGNOSIS — I5032 Chronic diastolic (congestive) heart failure: Secondary | ICD-10-CM | POA: Insufficient documentation

## 2017-04-07 DIAGNOSIS — N184 Chronic kidney disease, stage 4 (severe): Secondary | ICD-10-CM | POA: Insufficient documentation

## 2017-04-07 DIAGNOSIS — T148XXA Other injury of unspecified body region, initial encounter: Secondary | ICD-10-CM | POA: Diagnosis not present

## 2017-04-07 DIAGNOSIS — M545 Low back pain: Secondary | ICD-10-CM | POA: Diagnosis not present

## 2017-04-07 DIAGNOSIS — Y9301 Activity, walking, marching and hiking: Secondary | ICD-10-CM | POA: Diagnosis not present

## 2017-04-07 DIAGNOSIS — Z85828 Personal history of other malignant neoplasm of skin: Secondary | ICD-10-CM | POA: Insufficient documentation

## 2017-04-07 DIAGNOSIS — S199XXA Unspecified injury of neck, initial encounter: Secondary | ICD-10-CM | POA: Diagnosis not present

## 2017-04-07 DIAGNOSIS — Z951 Presence of aortocoronary bypass graft: Secondary | ICD-10-CM | POA: Diagnosis not present

## 2017-04-07 DIAGNOSIS — W0110XA Fall on same level from slipping, tripping and stumbling with subsequent striking against unspecified object, initial encounter: Secondary | ICD-10-CM | POA: Insufficient documentation

## 2017-04-07 DIAGNOSIS — S3992XA Unspecified injury of lower back, initial encounter: Secondary | ICD-10-CM | POA: Insufficient documentation

## 2017-04-07 DIAGNOSIS — I2510 Atherosclerotic heart disease of native coronary artery without angina pectoris: Secondary | ICD-10-CM | POA: Insufficient documentation

## 2017-04-07 DIAGNOSIS — Z79899 Other long term (current) drug therapy: Secondary | ICD-10-CM | POA: Insufficient documentation

## 2017-04-07 DIAGNOSIS — S0990XA Unspecified injury of head, initial encounter: Secondary | ICD-10-CM | POA: Insufficient documentation

## 2017-04-07 DIAGNOSIS — Y92129 Unspecified place in nursing home as the place of occurrence of the external cause: Secondary | ICD-10-CM | POA: Insufficient documentation

## 2017-04-07 DIAGNOSIS — Y999 Unspecified external cause status: Secondary | ICD-10-CM | POA: Diagnosis not present

## 2017-04-07 DIAGNOSIS — W19XXXA Unspecified fall, initial encounter: Secondary | ICD-10-CM

## 2017-04-07 DIAGNOSIS — S0003XA Contusion of scalp, initial encounter: Secondary | ICD-10-CM | POA: Diagnosis not present

## 2017-04-07 DIAGNOSIS — M533 Sacrococcygeal disorders, not elsewhere classified: Secondary | ICD-10-CM | POA: Diagnosis not present

## 2017-04-07 NOTE — ED Notes (Signed)
Pt walks with walker independently and with steady gait. Pt given [place] sandwich, graham crackers, and decaf coffee per request.

## 2017-04-07 NOTE — ED Notes (Signed)
PA notified of pt current oxygen level; verbalize to continue discharge.w

## 2017-04-07 NOTE — Discharge Instructions (Signed)
It was my pleasure taking care of you today!   Tylenol as needed for pain.  Follow up with your primary care doctor if symptoms persist.  Return to ER for new or worsening symptoms, any additional concerns.

## 2017-04-07 NOTE — ED Provider Notes (Signed)
[facility] DEPT Provider Note   CSN: [id number] Arrival date & time: 04/07/17  0806     History   Chief Complaint Chief Complaint  Patient presents with  . Fall    HPI Victoria Banks is a 83 y.o. female.  The history is provided by the patient and medical records. No language interpreter was used.  Fall  Pertinent negatives include no headaches.   Victoria Banks is a 83 y.o. female  with a PMH of CKD, CAD, HTN who presents to the Emergency Department from [facility] facility for evaluation after mechanical fall last night. Patient states that she was ambulating with walker which is typical for her when her foot got tripped up on the tubing from her oxygen tank. This caused her to fall backwards hitting her buttocks then subsequently her head. She denies LOC. Not on blood-thinners. She denies headache, neck pain, upper/lower extremity complain. Endorses only complaint of pain to the tailbone. It does not bother her sitting in bed, but gets worse with palpation and certain movements. No medications / treatments prior to arrival for symptoms.   Past Medical History:  Diagnosis Date  . Allergic rhinitis   . Aortic stenosis, severe 08/2009   s/p AVR w pericardial tissue valve 10/2009  . Atelectasis    scarring at basis on CXR  . Benign familial tremor   . Chronic diastolic (congestive) heart failure (HCC)    a. EF of 55-60% in 08/2015  . Coronary artery disease 10/2009   S/P SVG to OM   . CRD (chronic renal disease), stage IV ([place])   . Fibrocystic breast disease   . GERD (gastroesophageal reflux disease)   . High cholesterol   . History of stomach ulcers   . Hypertension   . Macular degeneration, right eye   . Multinodular goiter   . Osteoporosis 12/2010   Reclast given  . Positive for microalbuminuria    On tevetan  . Skin cancer of lip    "had MOHS on upper lip"    Patient Active Problem List   Diagnosis Date Noted  . VT (ventricular tachycardia) ([place])     . Acute respiratory failure with hypoxia ([place]) 02/16/2017  . Pulmonary hypertension ([place])   . Essential hypertension   . CAD (coronary artery disease) of artery bypass graft   . S/P AVR   . Chronic diastolic CHF (congestive heart failure) ([place]) 08/15/2014  . CKD (chronic kidney disease) stage 4, GFR 15-29 ml/min (HCC) 07/24/2014  . Pure hypercholesterolemia 11/15/2013    Past Surgical History:  Procedure Laterality Date  . AORTIC VALVE REPLACEMENT  2011  . CARDIAC CATHETERIZATION  11/17/2009   Archie Endo 10/18/2009  . CARDIAC VALVE REPLACEMENT    . CATARACT EXTRACTION, BILATERAL Bilateral   . CORONARY ARTERY BYPASS GRAFT  2011   SVG-OM  . DILATION AND CURETTAGE OF UTERUS    . FRACTURE SURGERY    . MOHS SURGERY     upper lip  . NASAL SINUS SURGERY    . ROTATOR CUFF REPAIR Right    Archie Endo 02/10/2011  . TONSILLECTOMY    . TYMPANOPLASTY     "dr rebuilt my eardrum so I could hear; don't remember which side"  . WRIST FRACTURE SURGERY Left 11/2001   Archie Endo 02/10/2011    OB History    No data available       Home Medications    Prior to Admission medications   Medication Sig Start Date End Date Taking? Authorizing Provider  acetaminophen (TYLENOL) 325 MG tablet Take 325 mg by mouth every 6 (six) hours as needed (for pain).    [provider]  calcium citrate-vitamin D (CALCIUM CITRATE +) 315-200 MG-UNIT per tablet Take 1 tablet by mouth 2 (two) times daily.    [provider]  doxazosin (CARDURA) 2 MG tablet Take 3 mg (1.5 tabs) in am and 4 mg (2 tabs) in pm 03/10/17   Shirley Friar, PA-C  fluticasone [facility]) 50 MCG/ACT nasal spray Place 1 spray into both nostrils daily.    [provider]  furosemide (LASIX) 80 MG tablet Take 1 tablet (80 mg total) by mouth 2 (two) times daily. May take EXTRA 40mg  (1/2 tab) once daily as needed (only do max of 3 times weekly) 03/10/17   Tillery, Satira Mccallum, PA-C  ketotifen (THERA TEARS ALLERGY) 0.025 %  ophthalmic solution Place 1 drop into both eyes 3 (three) times daily as needed (for dry eyes).     [provider]  LORazepam (ATIVAN) 0.5 MG tablet Take 0.5 mg by mouth at bedtime.     [provider]  magnesium hydroxide (MILK OF MAGNESIA) 400 MG/5ML suspension Take 15 mLs by mouth at bedtime.    [provider]  Melatonin 3 MG TABS Take 6 mg by mouth at bedtime.    [provider]  Multiple Vitamins-Minerals (CENTRUM SILVER ULTRA WOMENS PO) Take 1 tablet by mouth daily.    [provider]  Multiple Vitamins-Minerals (PRESERVISION AREDS) TABS Take 1 tablet by mouth 2 (two) times daily.     [provider]  NIFEdipine (PROCARDIA-XL/ADALAT CC) 60 MG 24 hr tablet Take 60 mg by mouth 2 (two) times daily.    [provider]  potassium chloride SA (K-DUR,KLOR-CON) 20 MEQ tablet Take 20 mEq by mouth 2 (two) times daily.    [provider]  pravastatin (PRAVACHOL) 40 MG tablet Take 40 mg by mouth every evening.    [provider]    Family History Family History  Problem Relation Age of Onset  . Coronary artery disease Mother   . Coronary artery disease Father   . Hyperlipidemia Daughter     Social History Social History  Substance Use Topics  . Smoking status: Never Smoker  . Smokeless tobacco: Never Used  . Alcohol use No     Allergies   Aceon [perindopril]; Beta adrenergic blockers; Hydralazine hcl; Actonel [risedronate sodium]; Allegra [fexofenadine]; Biaxin [clarithromycin]; Ciprofloxacin; Crestor [rosuvastatin]; Fosamax [alendronate sodium]; Lipitor [atorvastatin]; Promethazine hcl; Simvastatin; Sulfa antibiotics; and Welchol [colesevelam hcl]   Review of Systems Review of Systems  Musculoskeletal: Positive for arthralgias. Negative for neck pain.  Neurological: Negative for syncope and headaches.  Hematological: Does not bruise/bleed easily.  All other systems reviewed and are  negative.    Physical Exam Updated Vital Signs BP (!) 168/70 (BP Location: Left Arm)   Pulse 80   Temp 98.4 F (36.9 C) (Oral)   Resp 20   SpO2 91%   Physical Exam  Constitutional: She is oriented to person, place, and time. She appears well-developed and well-nourished. No distress.  HENT:  Head: Normocephalic and atraumatic.  Cardiovascular: Normal rate, regular rhythm and normal heart sounds.   No murmur heard. Pulmonary/Chest: Effort normal and breath sounds normal. No respiratory distress.  Abdominal: Soft. She exhibits no distension. There is no tenderness.  Musculoskeletal:       Back:  Tenderness to palpation as depicted in image with no overlying skin changes. No C/T upon  tenderness. No tenderness to the upper or lower extremities. Full range of motion. Pelvis stable. No pain with hip internal or external rotation.  Neurological: She is alert and oriented to person, place, and time.  Speech clear and goal oriented. CN 2-12 grossly intact. Normal finger-to-nose and rapid alternating movements. No drift. Strength and sensation intact.  Skin: Skin is warm and dry.  Nursing note and vitals reviewed.    ED Treatments / Results  Labs (all labs ordered are listed, but only abnormal results are displayed) Labs Reviewed - No data to display  EKG  EKG Interpretation None       Radiology Dg Lumbar Spine Complete  Result Date: 04/07/2017 CLINICAL DATA:  Low back pain since a fall this morning. Initial encounter. EXAM: LUMBAR SPINE - COMPLETE 4+ VIEW COMPARISON:  None. FINDINGS: There is no fracture. Severe loss of disc space height is present at L4-5. There is multilevel facet degenerative disease. Grade 1 anterolisthesis L3 on L4 trace anterolisthesis L4 on L5 is noted. There is extensive atherosclerotic vascular disease. IMPRESSION: No acute abnormality. Lumbar spondylosis. Atherosclerosis. Electronically Signed   By: Inge Rise M.D.   On: 04/07/2017 09:51   Dg  Sacrum/coccyx  Result Date: 04/07/2017 CLINICAL DATA:  Coccygeal pain due to a fall this morning. Initial encounter. EXAM: SACRUM AND COCCYX - 2+ VIEW COMPARISON:  None. FINDINGS: There is no evidence of fracture or other focal bone lesions. Lower lumbar spondylosis and atherosclerosis are noted. IMPRESSION: No acute abnormality. Electronically Signed   By: Inge Rise M.D.   On: 04/07/2017 09:51   Ct Head Wo Contrast  Result Date: 04/07/2017 CLINICAL DATA:  [place] last night and hit head. EXAM: CT HEAD WITHOUT CONTRAST CT CERVICAL SPINE WITHOUT CONTRAST TECHNIQUE: Multidetector CT imaging of the head and cervical spine was performed following the standard protocol without intravenous contrast. Multiplanar CT image reconstructions of the cervical spine were also generated. COMPARISON:  Head CT 07/26/2014 FINDINGS: CT HEAD FINDINGS Brain: Stable age related cerebral atrophy, ventriculomegaly and periventricular white matter disease. No extra-axial fluid collections are identified. No CT findings for acute hemispheric infarction or intracranial hemorrhage. No mass lesions. The brainstem and cerebellum are normal. Vascular: No hyperdense vessel or unexpected calcification. Stable advanced vascular calcifications. Skull: No skull fracture or bone lesion. Sinuses/Orbits: Chronic ethmoid sinus disease and evidence of prior sinonasal surgery. Mild mucoperiosteal thickening involving the right maxillary sinus. Chronic left mastoid effusions. The globes are intact. Other: No scalp lesion or hematoma. CT CERVICAL SPINE FINDINGS Alignment: Advanced degenerative cervical spondylosis with mild multilevel degenerative subluxations. Normal overall alignment. Skull base and vertebrae: No skullbase or cervical spine fracture is identified. The facets are normally aligned. No facet or lamina fractures. Soft tissues and spinal canal: No abnormal prevertebral soft tissue swelling and no epidural hematoma. Disc levels: The  spinal canal is generous. No significant disc protrusions, spinal or foraminal stenosis. Mild multilevel foraminal narrowing due to uncinate spurring changes and facet disease. Upper chest: Emphysematous changes and apical pulmonary scarring. Advanced atherosclerotic calcifications involving the aortic branch vessels and carotid artery's. Other: No neck mass.  Numerous bilateral thyroid nodules. IMPRESSION: 1. Chronic age related changes in the brain but no acute intracranial process. 2. No skull fracture. 3. Chronic sinus disease and left mastoid effusions. 4. Degenerative cervical spondylosis with multilevel disc disease and facet disease but no acute cervical spine fracture. Electronically Signed   By: Marijo Sanes M.D.   On: 04/07/2017 09:52   Ct Cervical Spine Wo  Contrast  Result Date: 04/07/2017 CLINICAL DATA:  [place] last night and hit head. EXAM: CT HEAD WITHOUT CONTRAST CT CERVICAL SPINE WITHOUT CONTRAST TECHNIQUE: Multidetector CT imaging of the head and cervical spine was performed following the standard protocol without intravenous contrast. Multiplanar CT image reconstructions of the cervical spine were also generated. COMPARISON:  Head CT 07/26/2014 FINDINGS: CT HEAD FINDINGS Brain: Stable age related cerebral atrophy, ventriculomegaly and periventricular white matter disease. No extra-axial fluid collections are identified. No CT findings for acute hemispheric infarction or intracranial hemorrhage. No mass lesions. The brainstem and cerebellum are normal. Vascular: No hyperdense vessel or unexpected calcification. Stable advanced vascular calcifications. Skull: No skull fracture or bone lesion. Sinuses/Orbits: Chronic ethmoid sinus disease and evidence of prior sinonasal surgery. Mild mucoperiosteal thickening involving the right maxillary sinus. Chronic left mastoid effusions. The globes are intact. Other: No scalp lesion or hematoma. CT CERVICAL SPINE FINDINGS Alignment: Advanced degenerative  cervical spondylosis with mild multilevel degenerative subluxations. Normal overall alignment. Skull base and vertebrae: No skullbase or cervical spine fracture is identified. The facets are normally aligned. No facet or lamina fractures. Soft tissues and spinal canal: No abnormal prevertebral soft tissue swelling and no epidural hematoma. Disc levels: The spinal canal is generous. No significant disc protrusions, spinal or foraminal stenosis. Mild multilevel foraminal narrowing due to uncinate spurring changes and facet disease. Upper chest: Emphysematous changes and apical pulmonary scarring. Advanced atherosclerotic calcifications involving the aortic branch vessels and carotid artery's. Other: No neck mass.  Numerous bilateral thyroid nodules. IMPRESSION: 1. Chronic age related changes in the brain but no acute intracranial process. 2. No skull fracture. 3. Chronic sinus disease and left mastoid effusions. 4. Degenerative cervical spondylosis with multilevel disc disease and facet disease but no acute cervical spine fracture. Electronically Signed   By: Marijo Sanes M.D.   On: 04/07/2017 09:52    Procedures Procedures (including critical care time)  Medications Ordered in ED Medications - No data to display   Initial Impression / Assessment and Plan / ED Course  I have reviewed the triage vital signs and the nursing notes.  Pertinent labs & imaging results that were available during my care of the patient were reviewed by me and considered in my medical decision making (see chart for details).    Victoria Banks is a 83 y.o. female who presents to ED for evaluation following mechanical fall last night. She fell backwards onto buttocks and then hit head. Patient endorses tailbone pain. No LOC, headache, neck pain. No focal neuro deficits on exam. Imaging of lumbar spine and coccyx negative. CT head/cervical negative. Patient ambulates with walker at baseline and walked around in ER with walker  with no difficulty. Evaluation does not show pathology that would require ongoing emergent intervention or inpatient treatment. Will discharge back to facility in stable condition.   Patient seen by and discussed with Dr. Ralene Bathe who agrees with treatment plan.  Final Clinical Impressions(s) / ED Diagnoses   Final diagnoses:  Fall  Injury of coccyx, initial encounter    New Prescriptions New Prescriptions   No medications on file     Kassidee Narciso, Ozella Almond, PA-C 04/07/17 1210    Quintella Reichert, MD 04/08/17 1050

## 2017-04-07 NOTE — ED Notes (Signed)
Bed: [facility] Expected date:  Expected time:  Means of arrival:  Comments: EMS-fall 

## 2017-04-07 NOTE — ED Triage Notes (Signed)
Per EMS pt from [facility] with complaint of tailbone pain post fall; no LOC or blood thinners.

## 2017-04-14 DIAGNOSIS — I129 Hypertensive chronic kidney disease with stage 1 through stage 4 chronic kidney disease, or unspecified chronic kidney disease: Secondary | ICD-10-CM | POA: Diagnosis not present

## 2017-04-14 DIAGNOSIS — N184 Chronic kidney disease, stage 4 (severe): Secondary | ICD-10-CM | POA: Diagnosis not present

## 2017-04-14 DIAGNOSIS — M810 Age-related osteoporosis without current pathological fracture: Secondary | ICD-10-CM | POA: Diagnosis not present

## 2017-04-14 DIAGNOSIS — I2510 Atherosclerotic heart disease of native coronary artery without angina pectoris: Secondary | ICD-10-CM | POA: Diagnosis not present

## 2017-04-14 DIAGNOSIS — I5030 Unspecified diastolic (congestive) heart failure: Secondary | ICD-10-CM | POA: Diagnosis not present

## 2017-04-18 ENCOUNTER — Emergency Department (HOSPITAL_COMMUNITY)
Admission: EM | Admit: 2017-04-18 | Discharge: 2017-04-19 | Disposition: A | Discharge status: Hospice | Payer: Medicare Other | Attending: Emergency Medicine | Admitting: Emergency Medicine

## 2017-04-18 DIAGNOSIS — R627 Adult failure to thrive: Secondary | ICD-10-CM | POA: Diagnosis not present

## 2017-04-18 DIAGNOSIS — I472 Ventricular tachycardia, unspecified: Secondary | ICD-10-CM

## 2017-04-18 DIAGNOSIS — W19XXXA Unspecified fall, initial encounter: Secondary | ICD-10-CM | POA: Diagnosis not present

## 2017-04-18 DIAGNOSIS — Z954 Presence of other heart-valve replacement: Secondary | ICD-10-CM | POA: Insufficient documentation

## 2017-04-18 DIAGNOSIS — Y999 Unspecified external cause status: Secondary | ICD-10-CM | POA: Diagnosis not present

## 2017-04-18 DIAGNOSIS — I130 Hypertensive heart and chronic kidney disease with heart failure and stage 1 through stage 4 chronic kidney disease, or unspecified chronic kidney disease: Secondary | ICD-10-CM | POA: Insufficient documentation

## 2017-04-18 DIAGNOSIS — R296 Repeated falls: Secondary | ICD-10-CM | POA: Diagnosis not present

## 2017-04-18 DIAGNOSIS — N184 Chronic kidney disease, stage 4 (severe): Secondary | ICD-10-CM

## 2017-04-18 DIAGNOSIS — S3992XA Unspecified injury of lower back, initial encounter: Secondary | ICD-10-CM | POA: Diagnosis not present

## 2017-04-18 DIAGNOSIS — R0902 Hypoxemia: Secondary | ICD-10-CM | POA: Diagnosis not present

## 2017-04-18 DIAGNOSIS — Y92129 Unspecified place in nursing home as the place of occurrence of the external cause: Secondary | ICD-10-CM | POA: Diagnosis not present

## 2017-04-18 DIAGNOSIS — I5032 Chronic diastolic (congestive) heart failure: Secondary | ICD-10-CM | POA: Diagnosis not present

## 2017-04-18 DIAGNOSIS — Z515 Encounter for palliative care: Secondary | ICD-10-CM | POA: Insufficient documentation

## 2017-04-18 DIAGNOSIS — Z955 Presence of coronary angioplasty implant and graft: Secondary | ICD-10-CM | POA: Insufficient documentation

## 2017-04-18 DIAGNOSIS — Y939 Activity, unspecified: Secondary | ICD-10-CM | POA: Diagnosis not present

## 2017-04-18 DIAGNOSIS — I509 Heart failure, unspecified: Secondary | ICD-10-CM | POA: Diagnosis not present

## 2017-04-18 DIAGNOSIS — I110 Hypertensive heart disease with heart failure: Secondary | ICD-10-CM | POA: Diagnosis not present

## 2017-04-18 DIAGNOSIS — Z79899 Other long term (current) drug therapy: Secondary | ICD-10-CM | POA: Insufficient documentation

## 2017-04-18 DIAGNOSIS — I2510 Atherosclerotic heart disease of native coronary artery without angina pectoris: Secondary | ICD-10-CM | POA: Diagnosis not present

## 2017-04-18 DIAGNOSIS — J9601 Acute respiratory failure with hypoxia: Secondary | ICD-10-CM

## 2017-04-18 DIAGNOSIS — T1490XA Injury, unspecified, initial encounter: Secondary | ICD-10-CM | POA: Diagnosis not present

## 2017-04-18 NOTE — ED Notes (Signed)
Bed: VL94 Expected date:  Expected time:  Means of arrival:  Comments: 67f fall

## 2017-04-19 ENCOUNTER — Emergency Department (HOSPITAL_COMMUNITY): Payer: Medicare Other

## 2017-04-19 ENCOUNTER — Encounter (HOSPITAL_COMMUNITY): Payer: Self-pay | Admitting: Emergency Medicine

## 2017-04-19 DIAGNOSIS — R4020 Unspecified coma: Secondary | ICD-10-CM | POA: Diagnosis not present

## 2017-04-19 DIAGNOSIS — S3992XA Unspecified injury of lower back, initial encounter: Secondary | ICD-10-CM | POA: Diagnosis present

## 2017-04-19 DIAGNOSIS — G8911 Acute pain due to trauma: Secondary | ICD-10-CM | POA: Diagnosis not present

## 2017-04-19 DIAGNOSIS — I5084 End stage heart failure: Secondary | ICD-10-CM | POA: Diagnosis not present

## 2017-04-19 DIAGNOSIS — R627 Adult failure to thrive: Secondary | ICD-10-CM | POA: Diagnosis not present

## 2017-04-19 DIAGNOSIS — Z955 Presence of coronary angioplasty implant and graft: Secondary | ICD-10-CM | POA: Diagnosis not present

## 2017-04-19 DIAGNOSIS — Y92129 Unspecified place in nursing home as the place of occurrence of the external cause: Secondary | ICD-10-CM | POA: Diagnosis not present

## 2017-04-19 DIAGNOSIS — I5032 Chronic diastolic (congestive) heart failure: Secondary | ICD-10-CM | POA: Diagnosis not present

## 2017-04-19 DIAGNOSIS — Z515 Encounter for palliative care: Secondary | ICD-10-CM

## 2017-04-19 DIAGNOSIS — Y999 Unspecified external cause status: Secondary | ICD-10-CM | POA: Diagnosis not present

## 2017-04-19 DIAGNOSIS — Z9181 History of falling: Secondary | ICD-10-CM | POA: Diagnosis not present

## 2017-04-19 DIAGNOSIS — K219 Gastro-esophageal reflux disease without esophagitis: Secondary | ICD-10-CM | POA: Diagnosis not present

## 2017-04-19 DIAGNOSIS — Z79899 Other long term (current) drug therapy: Secondary | ICD-10-CM | POA: Diagnosis not present

## 2017-04-19 DIAGNOSIS — Z954 Presence of other heart-valve replacement: Secondary | ICD-10-CM | POA: Diagnosis not present

## 2017-04-19 DIAGNOSIS — I350 Nonrheumatic aortic (valve) stenosis: Secondary | ICD-10-CM | POA: Diagnosis not present

## 2017-04-19 DIAGNOSIS — M810 Age-related osteoporosis without current pathological fracture: Secondary | ICD-10-CM | POA: Diagnosis not present

## 2017-04-19 DIAGNOSIS — R296 Repeated falls: Secondary | ICD-10-CM | POA: Diagnosis not present

## 2017-04-19 DIAGNOSIS — N184 Chronic kidney disease, stage 4 (severe): Secondary | ICD-10-CM | POA: Diagnosis not present

## 2017-04-19 DIAGNOSIS — I5033 Acute on chronic diastolic (congestive) heart failure: Secondary | ICD-10-CM | POA: Diagnosis not present

## 2017-04-19 DIAGNOSIS — I2510 Atherosclerotic heart disease of native coronary artery without angina pectoris: Secondary | ICD-10-CM | POA: Diagnosis not present

## 2017-04-19 DIAGNOSIS — W19XXXA Unspecified fall, initial encounter: Secondary | ICD-10-CM | POA: Diagnosis not present

## 2017-04-19 DIAGNOSIS — Y939 Activity, unspecified: Secondary | ICD-10-CM | POA: Diagnosis not present

## 2017-04-19 DIAGNOSIS — I130 Hypertensive heart and chronic kidney disease with heart failure and stage 1 through stage 4 chronic kidney disease, or unspecified chronic kidney disease: Secondary | ICD-10-CM | POA: Diagnosis not present

## 2017-04-19 DIAGNOSIS — R0602 Shortness of breath: Secondary | ICD-10-CM | POA: Diagnosis not present

## 2017-04-19 LAB — COMPREHENSIVE METABOLIC PANEL
ALK PHOS: 36 U/L — AB (ref 38–126)
ALT: 16 U/L (ref 14–54)
ANION GAP: 7 (ref 5–15)
AST: 15 U/L (ref 15–41)
Albumin: 2.8 g/dL — ABNORMAL LOW (ref 3.5–5.0)
BILIRUBIN TOTAL: 0.4 mg/dL (ref 0.3–1.2)
BUN: 90 mg/dL — ABNORMAL HIGH (ref 6–20)
CALCIUM: 10.5 mg/dL — AB (ref 8.9–10.3)
CO2: 28 mmol/L (ref 22–32)
CREATININE: 4.12 mg/dL — AB (ref 0.44–1.00)
Chloride: 102 mmol/L (ref 101–111)
GFR calc non Af Amer: 8 mL/min — ABNORMAL LOW (ref >60)
GFR, EST AFRICAN AMERICAN: 10 mL/min — AB (ref >60)
Glucose, Bld: 152 mg/dL — ABNORMAL HIGH (ref 65–99)
Potassium: 5.4 mmol/L — ABNORMAL HIGH (ref 3.5–5.1)
Sodium: 137 mmol/L (ref 135–145)
TOTAL PROTEIN: 6.4 g/dL — AB (ref 6.5–8.1)

## 2017-04-19 LAB — CBC WITH DIFFERENTIAL/PLATELET
Basophils Absolute: 0 10*3/uL (ref 0.0–0.1)
Basophils Relative: 0 %
Eosinophils Absolute: 0.1 10*3/uL (ref 0.0–0.7)
Eosinophils Relative: 1 %
HEMATOCRIT: 25.2 % — AB (ref 36.0–46.0)
HEMOGLOBIN: 8.1 g/dL — AB (ref 12.0–15.0)
LYMPHS ABS: 0.7 10*3/uL (ref 0.7–4.0)
LYMPHS PCT: 8 %
MCH: 27.7 pg (ref 26.0–34.0)
MCHC: 32.1 g/dL (ref 30.0–36.0)
MCV: 86.3 fL (ref 78.0–100.0)
MONOS PCT: 4 %
Monocytes Absolute: 0.3 10*3/uL (ref 0.1–1.0)
NEUTROS ABS: 7.9 10*3/uL — AB (ref 1.7–7.7)
NEUTROS PCT: 87 %
Platelets: 251 10*3/uL (ref 150–400)
RBC: 2.92 MIL/uL — ABNORMAL LOW (ref 3.87–5.11)
RDW: 15.5 % (ref 11.5–15.5)
WBC: 9.1 10*3/uL (ref 4.0–10.5)

## 2017-04-19 LAB — BRAIN NATRIURETIC PEPTIDE: B NATRIURETIC PEPTIDE 5: 3202.2 pg/mL — AB (ref 0.0–100.0)

## 2017-04-19 LAB — TROPONIN I: Troponin I: 0.11 ng/mL (ref <0.03)

## 2017-04-19 MED ORDER — MORPHINE SULFATE (PF) 2 MG/ML IV SOLN
1.0000 mg | INTRAVENOUS | Status: DC | PRN
  Administered 2017-04-19 (×3): 1 mg via INTRAVENOUS
  Filled 2017-04-19 (×3): qty 1

## 2017-04-19 MED ORDER — FUROSEMIDE 10 MG/ML IJ SOLN
80.0000 mg | Freq: Once | INTRAMUSCULAR | Status: AC
  Administered 2017-04-19: 80 mg via INTRAVENOUS
  Filled 2017-04-19: qty 8

## 2017-04-19 MED ORDER — ACETAMINOPHEN 500 MG PO TABS
1000.0000 mg | ORAL_TABLET | Freq: Three times a day (TID) | ORAL | Status: DC
  Filled 2017-04-19: qty 2

## 2017-04-19 MED ORDER — FUROSEMIDE 40 MG PO TABS: 80.0000 mg | ORAL_TABLET | Freq: Every day | ORAL | Status: DC

## 2017-04-19 MED ORDER — LORAZEPAM 2 MG/ML IJ SOLN
0.2500 mg | INTRAMUSCULAR | Status: DC | PRN
  Administered 2017-04-19: 0.25 mg via INTRAVENOUS
  Filled 2017-04-19: qty 1

## 2017-04-19 MED ORDER — ACETAMINOPHEN 325 MG PO TABS
650.0000 mg | ORAL_TABLET | Freq: Once | ORAL | Status: AC
  Administered 2017-04-19: 650 mg via ORAL
  Filled 2017-04-19: qty 2

## 2017-04-19 NOTE — ED Notes (Signed)
Pt.'s daughter gave pt. Her home med Tylenol 650mg  tab /PO for pain. No difficulty of swallowing .

## 2017-04-19 NOTE — ED Notes (Signed)
Per Dr. Sallyanne Havers. Place pt on Comfort Care.

## 2017-04-19 NOTE — ED Provider Notes (Addendum)
8:01 AM Assumed care from Dr. Florina Ou, please see their note for full history, physical and decision making until this point. In brief this is a 83 y.o. year old female who presented to the ED tonight with Fall     Fall with coccyx pain. Apparently refusing all workup and family just wants her to be placed somewhere.  Social work and case management consulted.  Pending placement hopefully?  0958: while attempting placement for patient, social work discussed with family who stated that they NEVER said they didn't want a workup. They would like a workup done and to find out what is going on. I called to discuss with family who did not answer the phone.   I had evaluated the patient a few minutes ago and she has wet lips stating she had drank and ate recently. She stated she is ready to die as she doesn't feel she has any worth here anymore. On exam she has tachypnea, hypertension, hypoxia, crackles in all lung fields. I will NOW (1005) initiate a workup for sob/weakness/falls/failure to thrive as it sounds like there may have been some type of miscommunication earlier.   Her workup is consistent with pretty significant heart failure exacerbation. I confirmed with daughter over the phone and niece at bedside and patient at bedside that she does not wish to have any workup or interventions besides comfort focused care. She is DNR.   Palliative care saw patient at bedside and feels like she is a candidate for hospice placement. Patient removed from all monitors, Comfort Care instituted. A dose of Lasix was given to help with comfort. She'll be continued on oxygen and given when necessary anti-anxiety and pain medication. Plan to work on placement.  Care transferred to Dr. Leonette Monarch pending social work placement.   Labs, studies and imaging reviewed by myself and considered in medical decision making if ordered. Imaging interpreted by radiology.  Labs Reviewed - No data to display  No orders to display     No Follow-up on file.    Merrily Pew, MD 04/19/17 [phone]

## 2017-04-19 NOTE — ED Notes (Signed)
[facility] made aware of need for transport to Hospice home. Victoria Banks

## 2017-04-19 NOTE — Progress Notes (Addendum)
CSW received a call from [name] Pt has been accepted by: [facility] Number for report is: [phone] Pt's unit/room/bed number will be: TBD Accepting physician: Dr. Ephraim Hamburger  Address is:  [address], [place] [address] [address] Ph: [phone]    Pt can arrive after 7:30pm on 04/19/17  Pt's sister [name] at ph: [phone] will arrive at the facility at 7pm to sign the pt in.  CSW will update RN and EDP.  Alphonse Guild. Wyat Infinger, Reed Pandy, CSI Clinical Social Worker Ph: [phone]

## 2017-04-19 NOTE — ED Provider Notes (Addendum)
[facility] DEPT Provider Note: Victoria Spurling, MD, FACEP  CSN: [id number] MRN: [id number] ARRIVAL: 04/18/17 at 2356 ROOM: WA22/WA22   CHIEF COMPLAINT  Fall  Level V caveat: Confusion HISTORY OF PRESENT ILLNESS  Victoria Banks is a 83 y.o. female with a history of frequent falls recently. She was brought here this morning after an unwitnessed fall estimated to have occurred about 10:30 PM at her assisted living facility. She was not able to recall how she fell. No obvious injuries were noted, but she is having some pain in her coccyx. She was noted to be oriented 2 on arrival but has gotten more confused as the night has gone on.  The patient's family reports that the patient has made it known to them that she no longer wishes to live. She has stopped eating and performing activities of daily living. The family would like her placed in hospice or palliative care. They have a facility in [place] that they're interested in having her transferred to. They have refused to have any diagnostic studies or other interventions performed in the ED.    Past Medical History:  Diagnosis Date  . Allergic rhinitis   . Aortic stenosis, severe 08/2009   s/p AVR w pericardial tissue valve 10/2009  . Atelectasis    scarring at basis on CXR  . Benign familial tremor   . Chronic diastolic (congestive) heart failure (HCC)    a. EF of 55-60% in 08/2015  . Coronary artery disease 10/2009   S/P SVG to OM   . CRD (chronic renal disease), stage IV ([place])   . Fibrocystic breast disease   . GERD (gastroesophageal reflux disease)   . High cholesterol   . History of stomach ulcers   . Hypertension   . Macular degeneration, right eye   . Multinodular goiter   . Osteoporosis 12/2010   Reclast given  . Positive for microalbuminuria    On tevetan  . Skin cancer of lip    "had MOHS on upper lip"    Past Surgical History:  Procedure Laterality Date  . AORTIC VALVE REPLACEMENT  2011  . CARDIAC  CATHETERIZATION  11/17/2009   Archie Endo 10/18/2009  . CARDIAC VALVE REPLACEMENT    . CATARACT EXTRACTION, BILATERAL Bilateral   . CORONARY ARTERY BYPASS GRAFT  2011   SVG-OM  . DILATION AND CURETTAGE OF UTERUS    . FRACTURE SURGERY    . MOHS SURGERY     upper lip  . NASAL SINUS SURGERY    . ROTATOR CUFF REPAIR Right    Archie Endo 02/10/2011  . TONSILLECTOMY    . TYMPANOPLASTY     "dr rebuilt my eardrum so I could hear; don't remember which side"  . WRIST FRACTURE SURGERY Left 11/2001   Archie Endo 02/10/2011    Family History  Problem Relation Age of Onset  . Coronary artery disease Mother   . Coronary artery disease Father   . Hyperlipidemia Daughter     Social History  Substance Use Topics  . Smoking status: Never Smoker  . Smokeless tobacco: Never Used  . Alcohol use No    Prior to Admission medications   Medication Sig Start Date End Date Taking? Authorizing Provider  acetaminophen (TYLENOL) 325 MG tablet Take 325 mg by mouth every 6 (six) hours as needed (for pain).   Yes [provider]  calcium citrate-vitamin D (CALCIUM CITRATE +) 315-200 MG-UNIT per tablet Take 1 tablet by mouth 2 (two) times daily.  Yes [provider]  doxazosin (CARDURA) 2 MG tablet Take 3 mg (1.5 tabs) in am and 4 mg (2 tabs) in pm 03/10/17  Yes Tillery, Satira Mccallum, PA-C  fluticasone [facility]) 50 MCG/ACT nasal spray Place 1 spray into both nostrils daily.   Yes [provider]  furosemide (LASIX) 80 MG tablet Take 1 tablet (80 mg total) by mouth 2 (two) times daily. May take EXTRA 40mg  (1/2 tab) once daily as needed (only do max of 3 times weekly) 03/10/17  Yes Tillery, Satira Mccallum, PA-C  ketotifen (THERA TEARS ALLERGY) 0.025 % ophthalmic solution Place 1 drop into both eyes 3 (three) times daily as needed (for dry eyes).    Yes [provider]  LORazepam (ATIVAN) 0.5 MG tablet Take 0.5 mg by mouth at bedtime.    Yes [provider]  magnesium hydroxide (MILK  OF MAGNESIA) 400 MG/5ML suspension Take 15 mLs by mouth at bedtime.   Yes [provider]  Melatonin 3 MG TABS Take 6 mg by mouth at bedtime.   Yes [provider]  Multiple Vitamins-Minerals (CENTRUM SILVER ULTRA WOMENS PO) Take 1 tablet by mouth daily.   Yes [provider]  Multiple Vitamins-Minerals (PRESERVISION AREDS) TABS Take 1 tablet by mouth 2 (two) times daily.    Yes [provider]  NIFEdipine (PROCARDIA-XL/ADALAT CC) 60 MG 24 hr tablet Take 60 mg by mouth 2 (two) times daily.   Yes [provider]  potassium chloride SA (K-DUR,KLOR-CON) 20 MEQ tablet Take 20 mEq by mouth 2 (two) times daily.   Yes [provider]  pravastatin (PRAVACHOL) 40 MG tablet Take 40 mg by mouth every evening.   Yes [provider]    Allergies Aceon [perindopril]; Beta adrenergic blockers; Hydralazine hcl; Actonel [risedronate sodium]; Allegra [fexofenadine]; Biaxin [clarithromycin]; Ciprofloxacin; Crestor [rosuvastatin]; Fosamax [alendronate sodium]; Lipitor [atorvastatin]; Promethazine hcl; Simvastatin; Sulfa antibiotics; and Welchol [colesevelam hcl]   REVIEW OF SYSTEMS     PHYSICAL EXAMINATION  Initial Vital Signs Blood pressure (!) 162/62, pulse 71, temperature 99 F (37.2 C), temperature source Oral, resp. rate 18, SpO2 93 %.  Examination General: Well-developed, cachectic female in no acute distress; appearance consistent with age of record HENT: normocephalic; atraumatic Eyes: mild anisocoria; lens implants Neck: supple; C-spine nontender Heart: regular rate and rhythm  Lungs: clear to auscultation bilaterally Abdomen: soft; nondistended; nontender; no masses or hepatosplenomegaly; bowel sounds present Back: coccygeal tenderness without crepitus Extremities: Arthritic changes; no acute deformity; no pain on passive range of motion; pulses normal Neurologic: Awake, alert, confused; motor function intact in all extremities and  symmetric; no facial droop Skin: Warm and dry   RESULTS  Summary of this visit's results, reviewed by myself:   EKG Interpretation  Date/Time:    Ventricular Rate:    PR Interval:    QRS Duration:   QT Interval:    QTC Calculation:   R Axis:     Text Interpretation:        Laboratory Studies: No results found for this or any previous visit (from the past 24 hour(s)). Imaging Studies: No results found.  ED COURSE  Nursing notes and initial vitals signs, including pulse oximetry, reviewed.  Vitals:   04/19/17 0000 04/19/17 0030 04/19/17 0313 04/19/17 0527  BP: (!) 196/64 (!) 177/55 (!) 162/62 (!) 163/85  Pulse: 75 62 71 75  Resp:   18 20  Temp:      TempSrc:      SpO2: 93% 92% 93% 90%  5:46 AM We consult social work and case management to look into placement later this morning.  PROCEDURES    ED DIAGNOSES     ICD-10-CM   1. Adult failure to thrive R62.7   2. Frequent falls R29.6   3. Injury of coccyx, initial encounter S39.92XA        Jahari Billy, Jenny Reichmann, MD 04/19/17 0546    Shanon Rosser, MD 04/19/17 [phone]

## 2017-04-19 NOTE — ED Notes (Signed)
Family refused blood work as well as PIV. They state pt is in a lot of discomfort, and has pretty much stopped eating or drinking. Family sts pt is here for help with hospice placement. They prefer hospice/palliative care of [facility], (per daughter they have 63 available beds). Pt appears highly uncomfortable and reports tailbone pain.

## 2017-04-19 NOTE — ED Triage Notes (Signed)
Per EMS , pt. From [facility] , reported of unwitnessed fall this evening around 1030, pt. Was found by her neighbor on the floor next to her bathroom . Pt.'s walker was on the living room. Pt. Do not remember how she fell.no report of injury noted. Pt. Was reported of fall two weeks ago and was seen here, negative for fractures/injury. Pt. Alert and oriented x2. Pt. On O2 [place] at 4L O2 sats on low 90's .

## 2017-04-19 NOTE — ED Provider Notes (Signed)
Care assumed from Dr. Dayna Barker at 1600. The patient is a 83 year old female with a history of CHF here with CHF exacerbation, fall resulting in sacral fracture. Patient and family requested to pursue hospice. Currently awaiting placement. Currently on comfort care.  1900: On reassessment patient appears comfortable in bed. Sleeping. No respiratory distress.  Safe for discharge to [facility] in [place].     Fatima Blank, MD 04/19/17 1919

## 2017-04-19 NOTE — ED Notes (Addendum)
[place], pt's daughter

## 2017-04-19 NOTE — ED Notes (Signed)
ED Provider at bedside. 

## 2017-04-19 NOTE — ED Notes (Signed)
Niece Janace Hoard) now at bedside with patient stating the pt lives in a retirement community but no assistance.  Pt started oxygen therapy 2 months ago 4L.    EDP now in room 10:42 with Aunt on speaker phone to discuss care.

## 2017-04-19 NOTE — ED Notes (Signed)
EDP notified of current vitals and oxygen saturation.

## 2017-04-19 NOTE — Progress Notes (Signed)
CSW sent referral to [facility]. CSW will follow up with hospice home for admission.   Kingsley Spittle, [facility] Emergency Room Clinical Social Worker [phone]

## 2017-04-19 NOTE — ED Notes (Signed)
Palliative Provider at bedside given trop result.

## 2017-04-19 NOTE — Progress Notes (Signed)
CSW spoke with patients family regarding [facility] potentially not having any beds available and need for 2nd options. Patients daughter provided CSW with contact Mellody Dance [phone] with [facility] to potentially help facilitate admission. CSW informed family that 2nd option( [facility]) would need to be contacted if CSW could not reach [facility].   Kingsley Spittle, [facility] Emergency Room Clinical Social Worker [phone]

## 2017-04-19 NOTE — ED Notes (Signed)
Patient transported to X-ray 

## 2017-04-19 NOTE — Consult Note (Signed)
Consultation Note Date: 04/19/2017   Patient Name: Victoria Banks  DOB: 07-Sep-1921  MRN: [id number]  Age / Sex: 83 y.o., female  PCP: Lajean Manes, MD Referring Physician: Merrily Pew, MD  Reason for Consultation: Disposition, Establishing goals of care, Hospice Evaluation, Inpatient hospice referral and Pain control  HPI/Patient Profile: 83 yo woman from [employer] (independent living division) with  bioprosthetic AVR, CAD, and end stage chronic diastolic CHF was brought to ED by EMS after sustaining a second fall in the past 2 weeks at home. Per family reports she has been declining for the past 6 months, recently placed on home O2 by HF clinic, worsening dyspnea, worsening renal function and loss of functional status.   Patient had goals of care discussion with HF team- she was already considering hospice options and requested that her life not be prolonged if she could no longer care for herself or required very aggressive medical intervention and for dyspnea and suffering she wanted comfort to be the primary goal.  Clinical Assessment and Goals of Care: [name] is currently in the ED at [facility] hospital, she is very frail, weak and visibly in distress, she is tachypniec , diaphoretic and uncomfortable on monitoring devices. She has been on ED stretcher for many hours, her back and bottom is starting to hurt and she is slightly restless. She is HOH but mentally sharp and able to communicate in short sentences-she desaturated to the low 80's with minimal conversation on 4L nasal cannula O2.   She tells me that she wants no aggressive medical intervention, "to go easy", to not "struggle"- she is the kind of lady that asserts if she cannot get better or maintain her complete independence that she would not want to live in a severely debilitated state or go through aggressive interventions  if her condition is not reversible. She is quite resolved on this request and tells me "Im ready". We discussed uncertainty of timing of EOL, but we can do much better with making her comfortable and honor her wishes.  Support of family is very strong. HCPOA- Niece, grand-daughter at bedside   Her niece lives in [place], they are requesting Hospice Services-Inpatient if available at [facility].  SUMMARY OF RECOMMENDATIONS    Patient has limited prognosis- probably <2 weeks  Frail, minimal intake, will require medication for symptoms  Elevated tropinin, possible acute cardiac event evolving, SCr >4  She will require aggressive symptom management at EOL, dyspnea from heart failure  Patient and Family philosophy of care and comfort align with hospice.  [facility] Referral is very appropriate for this patient- I will discuss with CSW -hopefully we can get her out of ED ASAP-family endorse and are appreciative of this plan.  Code Status/Advance Care Planning:  DNR    Symptom Management:   Dyspnea/Pain: Morphine 1mg  q1 PRN, will determine her needs-may need continuous infusion- opioid naive  Scheduled Tylenol, if alert and awake  She was on low dose Ativan at home qhs- will give IV  PRN for sedation since she is supervised and safe and has tolerated this in the past.  Palliative Prophylaxis:   Aspiration, Bowel Regimen, Delirium Protocol, Frequent Pain Assessment, Oral Care and Turn Reposition  Additional Recommendations (Limitations, Scope, Preferences):  Avoid Hospitalization, Full Comfort Care, Minimize Medications, No Diagnostics, No Glucose Monitoring, No IV Fluids and No Lab Draws  Psycho-social/Spiritual:   Desire for further Chaplaincy support:no  Additional Recommendations: Education on Hospice  Prognosis:   < 2 weeks  Discharge Planning:  Hopefully we can get her a bed at [facility] soon, I placed call to intake team to help facilitate this process- if no bed we  should admit for comfort care until appropriate care location can be determined. She will need ambulance transport. [place] on chart.     Primary Diagnoses: Present on Admission: **None**   I have reviewed the medical record, interviewed the patient and family, and examined the patient. The following aspects are pertinent.  Past Medical History:  Diagnosis Date  . Allergic rhinitis   . Aortic stenosis, severe 08/2009   s/p AVR w pericardial tissue valve 10/2009  . Atelectasis    scarring at basis on CXR  . Benign familial tremor   . Chronic diastolic (congestive) heart failure (HCC)    a. EF of 55-60% in 08/2015  . Coronary artery disease 10/2009   S/P SVG to OM   . CRD (chronic renal disease), stage IV ([place])   . Fibrocystic breast disease   . GERD (gastroesophageal reflux disease)   . High cholesterol   . History of stomach ulcers   . Hypertension   . Macular degeneration, right eye   . Multinodular goiter   . Osteoporosis 12/2010   Reclast given  . Positive for microalbuminuria    On tevetan  . 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 Marital status: Widowed    Spouse name: N/A  . Number of children: N/A  . Years of education: N/A   Social History Main Topics  . Smoking status: Never Smoker  . Smokeless tobacco: Never Used  . Alcohol use No  . Drug use: No  . Sexual activity: Not Asked   Other Topics Concern  . None   Social History Narrative  . None   Family History  Problem Relation Age of Onset  . Coronary artery disease Mother   . Coronary artery disease Father   . Hyperlipidemia Daughter    Scheduled Meds: . acetaminophen  1,000 mg Oral TID  . furosemide  80 mg Intravenous Once  . furosemide  80 mg Oral Daily   Continuous Infusions: PRN Meds:.LORazepam, morphine injection Medications Prior to Admission:  Prior to Admission medications   Medication Sig Start Date End Date Taking? Authorizing  Provider  acetaminophen (TYLENOL) 325 MG tablet Take 325 mg by mouth every 6 (six) hours as needed (for pain).   Yes [provider]  calcium citrate-vitamin D (CALCIUM CITRATE +) 315-200 MG-UNIT per tablet Take 1 tablet by mouth 2 (two) times daily.   Yes [provider]  doxazosin (CARDURA) 2 MG tablet Take 3 mg (1.5 tabs) in am and 4 mg (2 tabs) in pm 03/10/17  Yes Tillery, Satira Mccallum, PA-C  fluticasone [facility]) 50 MCG/ACT nasal spray Place 1 spray into both nostrils daily.   Yes [provider]  furosemide (LASIX) 80 MG tablet Take 1 tablet (80 mg total) by mouth  2 (two) times daily. May take EXTRA 40mg  (1/2 tab) once daily as needed (only do max of 3 times weekly) 03/10/17  Yes Tillery, Satira Mccallum, PA-C  ketotifen (THERA TEARS ALLERGY) 0.025 % ophthalmic solution Place 1 drop into both eyes 3 (three) times daily as needed (for dry eyes).    Yes [provider]  LORazepam (ATIVAN) 0.5 MG tablet Take 0.5 mg by mouth at bedtime.    Yes [provider]  magnesium hydroxide (MILK OF MAGNESIA) 400 MG/5ML suspension Take 15 mLs by mouth at bedtime.   Yes [provider]  Melatonin 3 MG TABS Take 6 mg by mouth at bedtime.   Yes [provider]  Multiple Vitamins-Minerals (CENTRUM SILVER ULTRA WOMENS PO) Take 1 tablet by mouth daily.   Yes [provider]  Multiple Vitamins-Minerals (PRESERVISION AREDS) TABS Take 1 tablet by mouth 2 (two) times daily.    Yes [provider]  NIFEdipine (PROCARDIA-XL/ADALAT CC) 60 MG 24 hr tablet Take 60 mg by mouth 2 (two) times daily.   Yes [provider]  potassium chloride SA (K-DUR,KLOR-CON) 20 MEQ tablet Take 20 mEq by mouth 2 (two) times daily.   Yes [provider]  pravastatin (PRAVACHOL) 40 MG tablet Take 40 mg by mouth every evening.   Yes [provider]   Allergies  Allergen Reactions  . Aceon [Perindopril] Other (See Comments)    Chest Pain    . Beta Adrenergic Blockers Other (See Comments)    Serious cough both times she tried  . Hydralazine Hcl Cough  . Actonel [Risedronate Sodium] Other (See Comments)    Unknown  . Allegra [Fexofenadine] Other (See Comments)    BACK PAIN  . Biaxin [Clarithromycin] Nausea Only  . Ciprofloxacin Nausea Only  . Crestor [Rosuvastatin] Itching  . Fosamax [Alendronate Sodium] Other (See Comments)    Muscle pain  . Lipitor [Atorvastatin] Itching  . Promethazine Hcl Other (See Comments)    Unknown  . Simvastatin Rash  . Sulfa Antibiotics Other (See Comments)    GI Upset  . Welchol [Colesevelam Hcl] Rash   Review of Systems  Physical Exam  Vital Signs: BP (!) 178/53 (BP Location: Right Arm)   Pulse 64   Temp 99 F (37.2 C) (Oral)   Resp (!) 22   SpO2 94%  Pain Assessment: PAINAD       SpO2: SpO2: 94 % O2 Device:SpO2: 94 % O2 Flow Rate: .O2 Flow Rate (L/min): 5.5 L/min  IO: Intake/output summary: No intake or output data in the 24 hours ending 04/19/17 1227  LBM:   Baseline Weight:   Most recent weight:       Palliative Assessment/Data:   Flowsheet Rows     Most Recent Value  Intake Tab  Referral Department  -- [ED]  Unit at Time of Referral  Other (Comment) [ED]  Palliative Care Primary Diagnosis  Cardiac  Date Notified  04/19/17  Palliative Care Type  New Palliative care  Reason for referral  Clarify Goals of Care, Counsel Regarding Hospice, Non-pain Symptom, End of Life Care Assistance  Date of Admission  04/19/17  Date first seen by Palliative Care  04/19/17  # of days Palliative referral response time  0 Day(s)  # of days IP prior to Palliative referral  0  Clinical Assessment  Palliative Performance Scale Score  20%  Pain Max last 24 hours  5 [Back, Buttocks]  Pain Min Last 24 hours  5  Dyspnea Max Last 24  Hours  8  Dyspnea Min Last 24 hours  8  Nausea Max Last 24 Hours  0  Nausea Min Last 24 Hours  0  Anxiety Max Last 24 Hours  5  Anxiety Min Last 24  Hours  5  Psychosocial & Spiritual Assessment  Palliative Care Outcomes  Patient/Family meeting held?  Yes  Palliative Care Outcomes  Clarified goals of care, Counseled regarding hospice, Completed durable DNR, Changed to focus on comfort, Changed CPR status, Transitioned to hospice, Other (Comment)      Time In: 12 Time Out: 12:50 Time Total: 50 minutes Greater than 50%  of this time was spent counseling and coordinating care related to the above assessment and plan.  Signed by: Lane Hacker, DO   Please contact Palliative Medicine Team phone at [phone] for questions and concerns.  For individual provider: See Shea Evans

## 2017-04-19 NOTE — Progress Notes (Signed)
Palliative Medicine RN Note: Rec'd a call from Dr Dolly Rias in ED at [facility]. Per notes, pt has stopped eating and had a fall. Family reports she is ready to die and that they want to aggressive interventions. Goals are clearly palliative and comfort at this time, and family requesting hospice inpatient in [facility].   Order was placed by ED MD to find inpt hospice placement, but a consult order was also placed to PMT. If goals are clear, palliative medicine does not have a role here. I spoke with Dr Dolly Rias, and Dr Domingo Cocking w PMT spoke to the ED SW. I also left a message for ED SW letting her know that order for hospice placement can be initiated without a PMT consult.   If there are unmet PMT needs, please call our office at [phone].   Marjie Skiff Jeyden Coffelt, RN, BSN, [facility] 04/19/2017 9:08 AM Cell [phone] 8:00-4:00 Monday-Friday Office [phone]

## 2017-04-19 NOTE — Progress Notes (Signed)
CSW awaiting palliative care consult to determine goals of care. CSW will follow up after palliative meets with patient.   Kingsley Spittle, [facility] Emergency Room Clinical Social Worker [phone]

## 2017-04-19 NOTE — Progress Notes (Signed)
ED CM spoke with ED CSW concerning patient and family desiring residential hospice. Patient is not residential hospice appropriate at this time home is appropriate at this time CSW will speak with patient and family and CM will follow up to discuss Home hospice options.

## 2017-04-19 NOTE — ED Notes (Signed)
Pt tolerates sips of water and meds by mouth w/o difficulty. Pt requests lip balm. Lips are dry and cracked. Mouth care performed.

## 2017-04-19 NOTE — ED Notes (Signed)
Pt.'s daughter as POA at bedside and informed this Nurse and Camera operator of pt.'s wishes like being in a palliative care/ services , that pt. Has DNR form but kept in her freezer at the assisted Living.  Daughter requested for pt.'s to be assisted in placement to Palliative care in [facility] where she lives.

## 2017-04-20 DIAGNOSIS — I2510 Atherosclerotic heart disease of native coronary artery without angina pectoris: Secondary | ICD-10-CM | POA: Diagnosis not present

## 2017-04-20 DIAGNOSIS — I5084 End stage heart failure: Secondary | ICD-10-CM | POA: Diagnosis not present

## 2017-04-20 DIAGNOSIS — I350 Nonrheumatic aortic (valve) stenosis: Secondary | ICD-10-CM | POA: Diagnosis not present

## 2017-04-20 DIAGNOSIS — I130 Hypertensive heart and chronic kidney disease with heart failure and stage 1 through stage 4 chronic kidney disease, or unspecified chronic kidney disease: Secondary | ICD-10-CM | POA: Diagnosis not present

## 2017-04-20 DIAGNOSIS — I5033 Acute on chronic diastolic (congestive) heart failure: Secondary | ICD-10-CM | POA: Diagnosis not present

## 2017-04-20 DIAGNOSIS — N184 Chronic kidney disease, stage 4 (severe): Secondary | ICD-10-CM | POA: Diagnosis not present

## 2017-04-28 DEATH — deceased

## 2017-11-17 IMAGING — CR DG LUMBAR SPINE COMPLETE 4+V
5 series · 5 of 5 positions shown · non-contrast
Comparison: None.

CLINICAL DATA: Low back pain since a fall this morning. Initial
encounter.

EXAM:
LUMBAR SPINE - COMPLETE 4+ VIEW

[t lumbar spine ap]
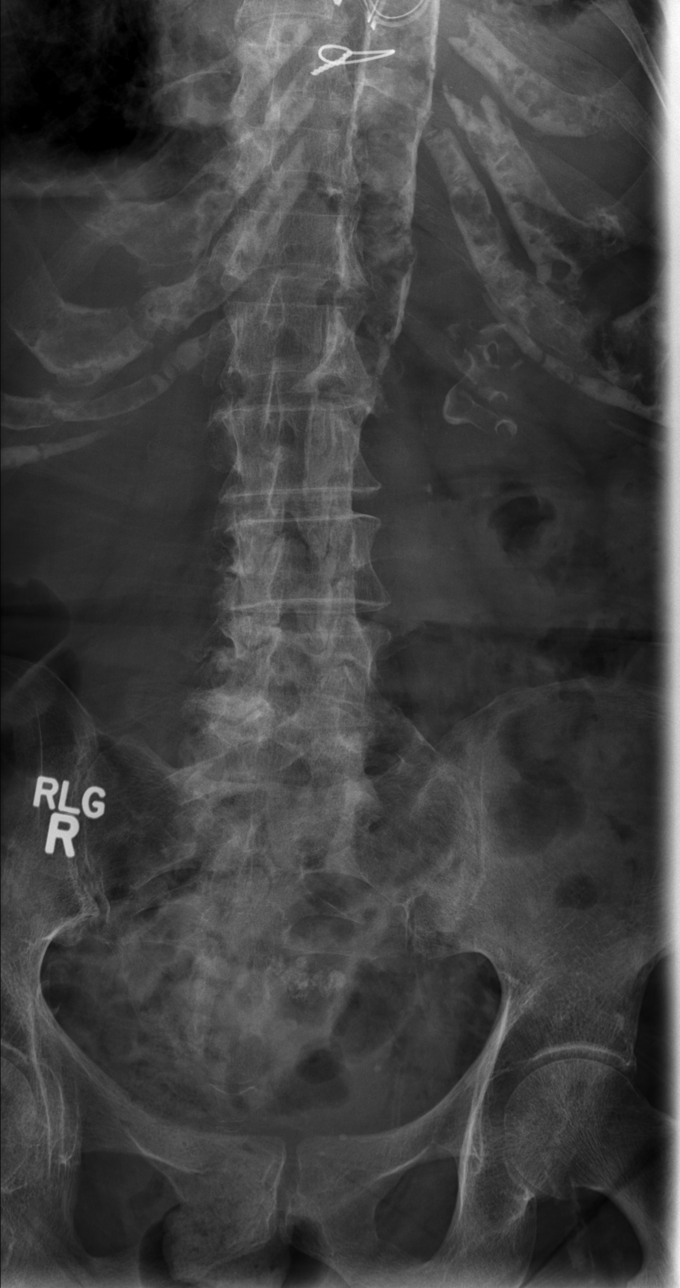

[t lumbar spine obl (1 of 2)]
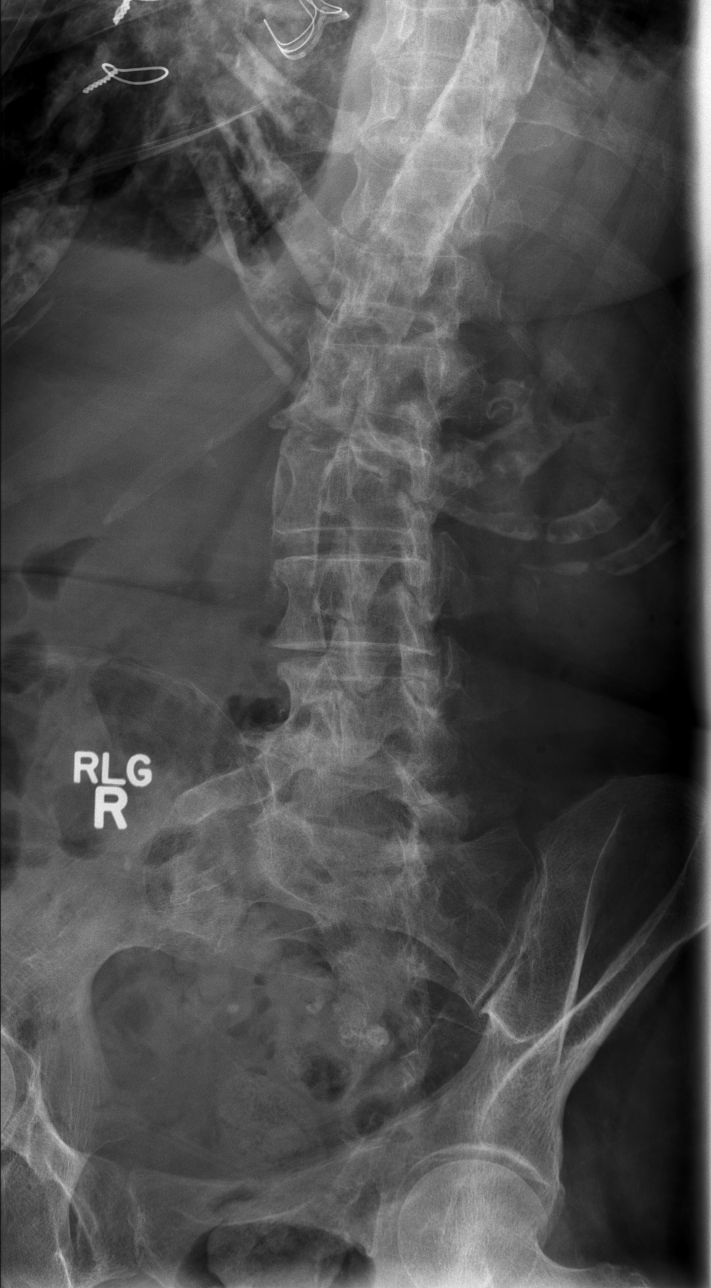

[t lumbar spine obl (2 of 2)]
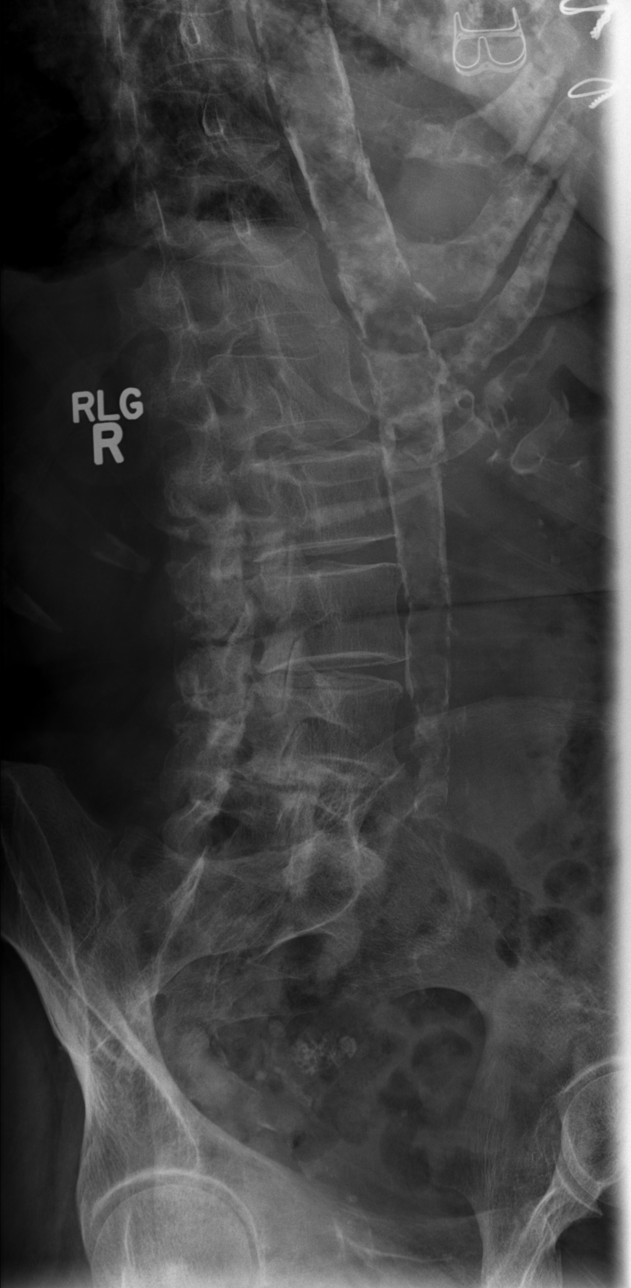

[t lumbar spine lat]
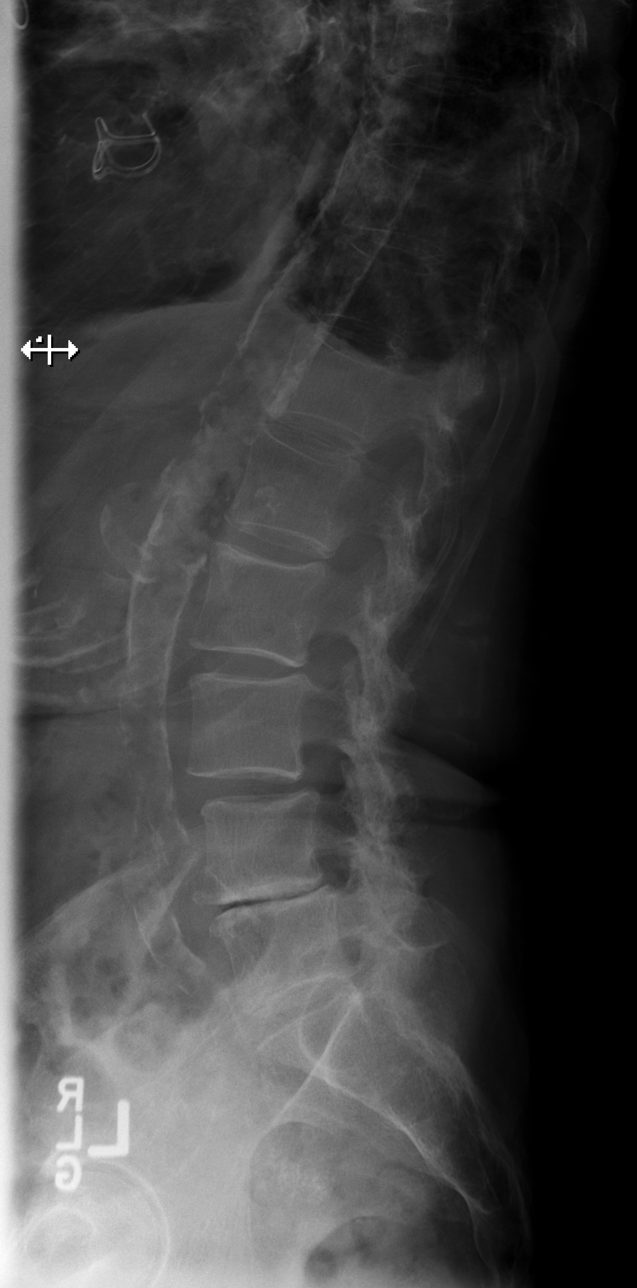

[t lumbar l-5 s-1 spot]
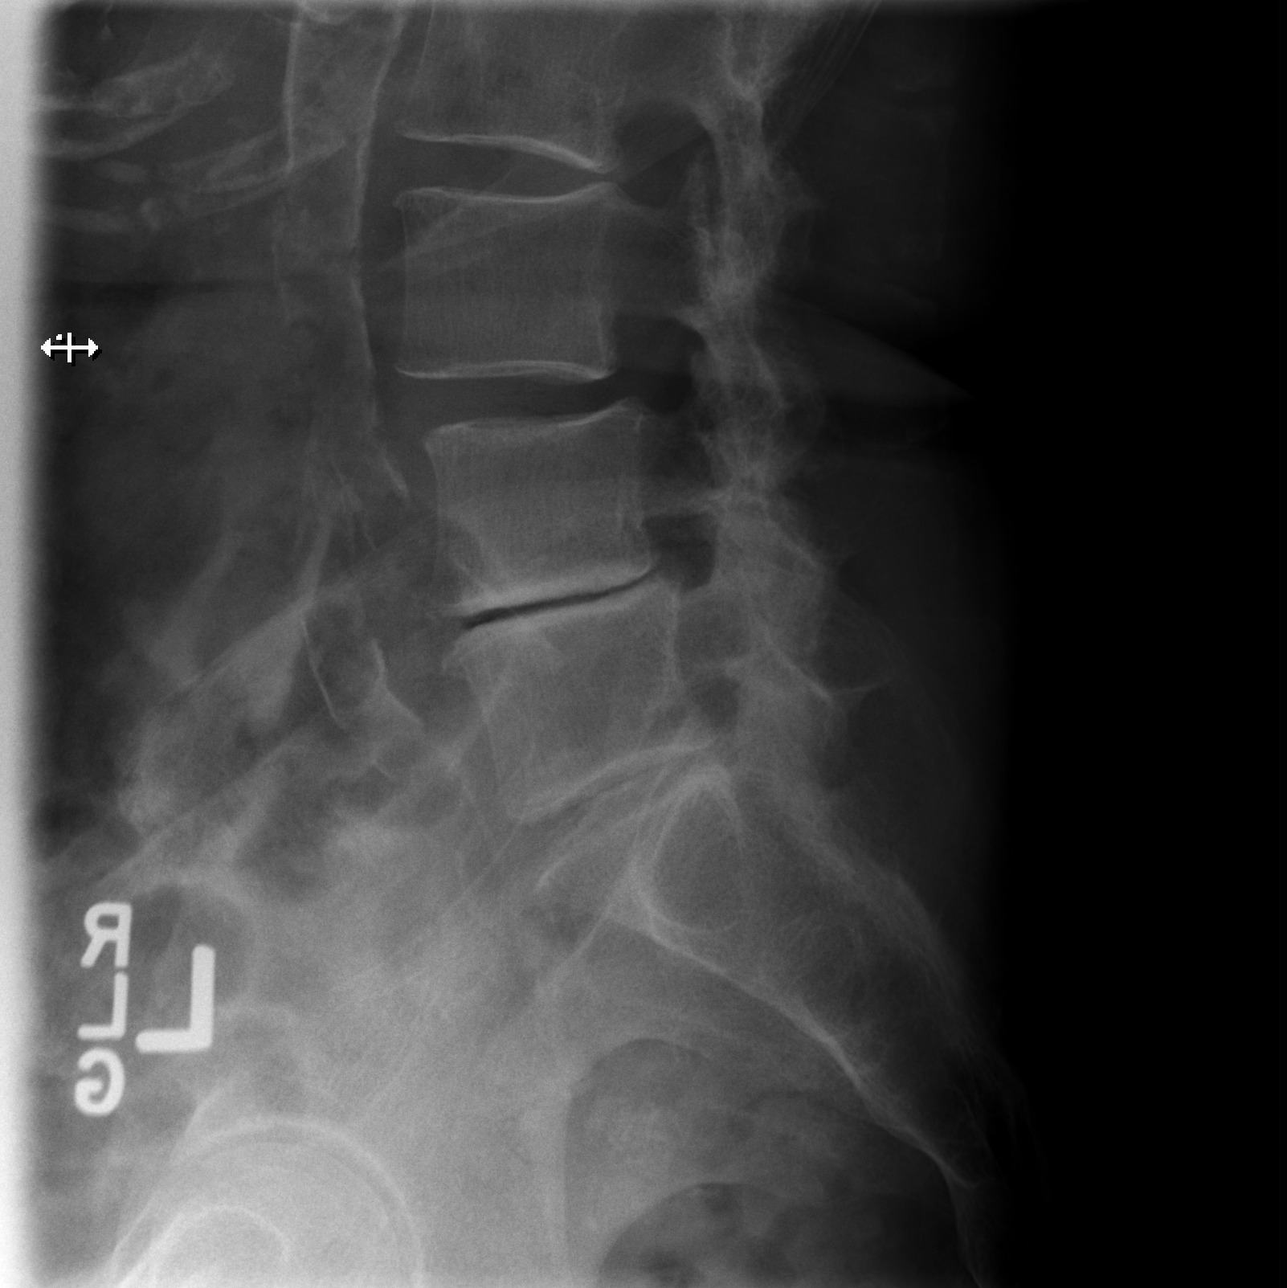

[5 of 5 positions shown; findings below may reference images not displayed]

FINDINGS: There is no fracture. Severe loss of disc space height is present at
L4-5. There is multilevel facet degenerative disease. Grade 1
anterolisthesis L3 on L4 trace anterolisthesis L4 on L5 is noted.
There is extensive atherosclerotic vascular disease.
IMPRESSION: No acute abnormality.

Lumbar spondylosis.

Atherosclerosis.

## 2018-01-20 ENCOUNTER — Telehealth (HOSPITAL_COMMUNITY): Payer: Self-pay | Admitting: Vascular Surgery

## 2018-01-20 NOTE — Telephone Encounter (Signed)
Called pt to make yearly f/u w/ db in June, pt does not have VM will try back later

## 2018-01-31 ENCOUNTER — Encounter (HOSPITAL_COMMUNITY): Payer: Self-pay | Admitting: Vascular Surgery
